# Patient Record
Sex: Male | Born: 2000 | Race: White | Hispanic: No | Marital: Single | Smoking: Never smoker
Health system: Southern US, Community
[De-identification: ages and names within clinical notes are randomized; demographics above are authoritative.]

## PROBLEM LIST (undated history)

## (undated) DIAGNOSIS — E119 Type 2 diabetes mellitus without complications: Secondary | ICD-10-CM

## (undated) DIAGNOSIS — F909 Attention-deficit hyperactivity disorder, unspecified type: Secondary | ICD-10-CM

## (undated) HISTORY — PX: NO PAST SURGERIES: SHX2092

---
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
## 2019-07-03 ENCOUNTER — Encounter: Payer: Self-pay | Admitting: Emergency Medicine

## 2019-07-03 ENCOUNTER — Emergency Department: Payer: Medicaid Other

## 2019-07-03 ENCOUNTER — Observation Stay
Admission: EM | Admit: 2019-07-03 | Discharge: 2019-07-04 | Disposition: A | Discharge status: Home / Self Care | Payer: Medicaid Other | Attending: Family Medicine | Admitting: Family Medicine

## 2019-07-03 ENCOUNTER — Other Ambulatory Visit: Payer: Self-pay

## 2019-07-03 DIAGNOSIS — Z20822 Contact with and (suspected) exposure to covid-19: Secondary | ICD-10-CM | POA: Insufficient documentation

## 2019-07-03 DIAGNOSIS — E109 Type 1 diabetes mellitus without complications: Secondary | ICD-10-CM

## 2019-07-03 DIAGNOSIS — R197 Diarrhea, unspecified: Secondary | ICD-10-CM | POA: Diagnosis not present

## 2019-07-03 DIAGNOSIS — Z794 Long term (current) use of insulin: Secondary | ICD-10-CM | POA: Insufficient documentation

## 2019-07-03 DIAGNOSIS — E861 Hypovolemia: Secondary | ICD-10-CM | POA: Diagnosis not present

## 2019-07-03 DIAGNOSIS — F909 Attention-deficit hyperactivity disorder, unspecified type: Secondary | ICD-10-CM | POA: Diagnosis not present

## 2019-07-03 DIAGNOSIS — R112 Nausea with vomiting, unspecified: Principal | ICD-10-CM | POA: Diagnosis present

## 2019-07-03 DIAGNOSIS — R1013 Epigastric pain: Secondary | ICD-10-CM

## 2019-07-03 DIAGNOSIS — R000 Tachycardia, unspecified: Secondary | ICD-10-CM | POA: Diagnosis not present

## 2019-07-03 DIAGNOSIS — E860 Dehydration: Secondary | ICD-10-CM

## 2019-07-03 LAB — URINALYSIS, COMPLETE (UACMP) WITH MICROSCOPIC
Bacteria, UA: NONE SEEN
Bilirubin Urine: NEGATIVE
Glucose, UA: NEGATIVE mg/dL
Hgb urine dipstick: NEGATIVE
Ketones, ur: 5 mg/dL — AB
Leukocytes,Ua: NEGATIVE
Nitrite: NEGATIVE
Protein, ur: 30 mg/dL — AB
Specific Gravity, Urine: 1.018 (ref 1.005–1.030)
pH: 5 (ref 5.0–8.0)

## 2019-07-03 LAB — COMPREHENSIVE METABOLIC PANEL
ALT: 12 U/L (ref 0–44)
AST: 28 U/L (ref 15–41)
Albumin: 5.3 g/dL — ABNORMAL HIGH (ref 3.5–5.0)
Alkaline Phosphatase: 95 U/L (ref 38–126)
Anion gap: 19 — ABNORMAL HIGH (ref 5–15)
BUN: 15 mg/dL (ref 6–20)
CO2: 19 mmol/L — ABNORMAL LOW (ref 22–32)
Calcium: 10.2 mg/dL (ref 8.9–10.3)
Chloride: 103 mmol/L (ref 98–111)
Creatinine, Ser: 1.05 mg/dL (ref 0.61–1.24)
GFR calc Af Amer: >60 mL/min (ref >60)
GFR calc non Af Amer: >60 mL/min (ref >60)
Glucose, Bld: 85 mg/dL (ref 70–99)
Potassium: 3.6 mmol/L (ref 3.5–5.1)
Sodium: 141 mmol/L (ref 135–145)
Total Bilirubin: 1.3 mg/dL — ABNORMAL HIGH (ref 0.3–1.2)
Total Protein: 9.3 g/dL — ABNORMAL HIGH (ref 6.5–8.1)

## 2019-07-03 LAB — CBC
HCT: 53.3 % — ABNORMAL HIGH (ref 39.0–52.0)
Hemoglobin: 19 g/dL — ABNORMAL HIGH (ref 13.0–17.0)
MCH: 29 pg (ref 26.0–34.0)
MCHC: 35.6 g/dL (ref 30.0–36.0)
MCV: 81.3 fL (ref 80.0–100.0)
Platelets: 390 10*3/uL (ref 150–400)
RBC: 6.56 MIL/uL — ABNORMAL HIGH (ref 4.22–5.81)
RDW: 12.3 % (ref 11.5–15.5)
WBC: 19.5 10*3/uL — ABNORMAL HIGH (ref 4.0–10.5)
nRBC: 0 % (ref 0.0–0.2)

## 2019-07-03 LAB — BASIC METABOLIC PANEL
Anion gap: 4 — ABNORMAL LOW (ref 5–15)
BUN: 13 mg/dL (ref 6–20)
CO2: 27 mmol/L (ref 22–32)
Calcium: 7.5 mg/dL — ABNORMAL LOW (ref 8.9–10.3)
Chloride: 112 mmol/L — ABNORMAL HIGH (ref 98–111)
Creatinine, Ser: 0.82 mg/dL (ref 0.61–1.24)
GFR calc Af Amer: >60 mL/min (ref >60)
GFR calc non Af Amer: >60 mL/min (ref >60)
Glucose, Bld: 128 mg/dL — ABNORMAL HIGH (ref 70–99)
Potassium: 4.2 mmol/L (ref 3.5–5.1)
Sodium: 143 mmol/L (ref 135–145)

## 2019-07-03 LAB — GLUCOSE, CAPILLARY
Glucose-Capillary: 77 mg/dL (ref 70–99)
Glucose-Capillary: 80 mg/dL (ref 70–99)
Glucose-Capillary: 44 mg/dL — CL (ref 70–99)
Glucose-Capillary: 125 mg/dL — ABNORMAL HIGH (ref 70–99)
Glucose-Capillary: 125 mg/dL — ABNORMAL HIGH (ref 70–99)
Glucose-Capillary: 87 mg/dL (ref 70–99)

## 2019-07-03 LAB — RESPIRATORY PANEL BY RT PCR (FLU A&B, COVID)
Influenza A by PCR: NEGATIVE
Influenza B by PCR: NEGATIVE
SARS Coronavirus 2 by RT PCR: NEGATIVE

## 2019-07-03 LAB — LIPASE, BLOOD: Lipase: 15 U/L (ref 11–51)

## 2019-07-03 IMAGING — US US ABDOMEN LIMITED
1 series · 14 of 25 positions shown · non-contrast
Comparison: None.

CLINICAL DATA: Right upper quadrant pain with nausea and vomiting

EXAM:
ULTRASOUND ABDOMEN LIMITED RIGHT UPPER QUADRANT

[Series 1: us abdomen limited · 0.25mm/px · 14 of 39 slices shown]
[im 1/39]
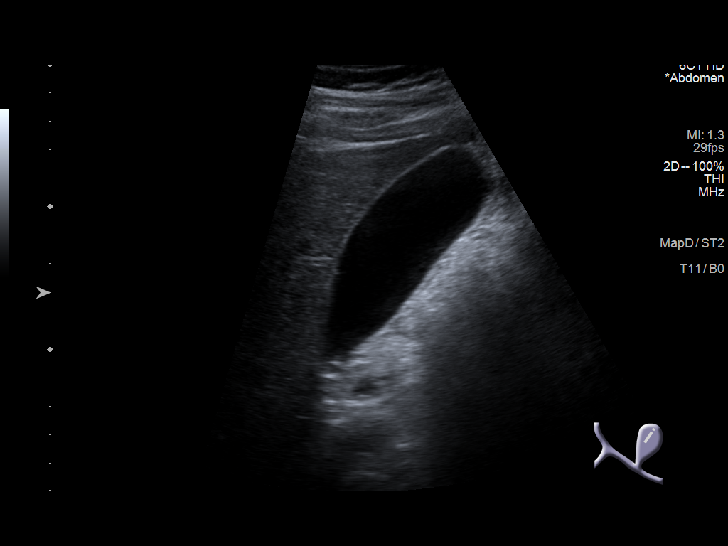
[im 4/39]
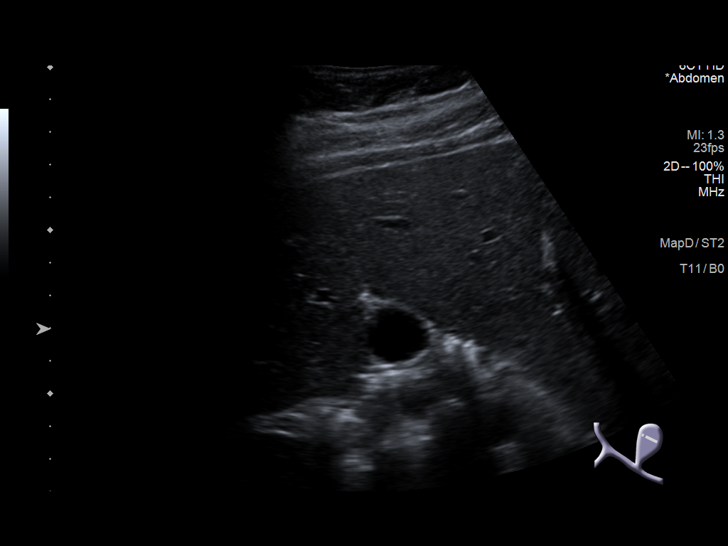
[im 7/39]
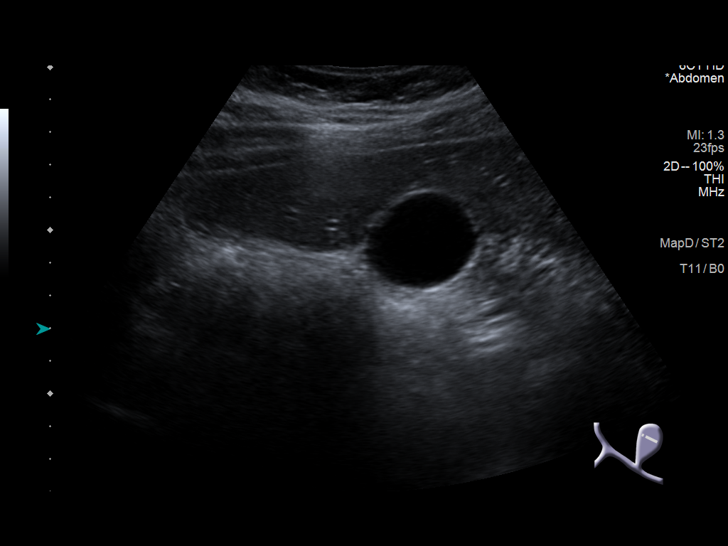
[im 10/39]
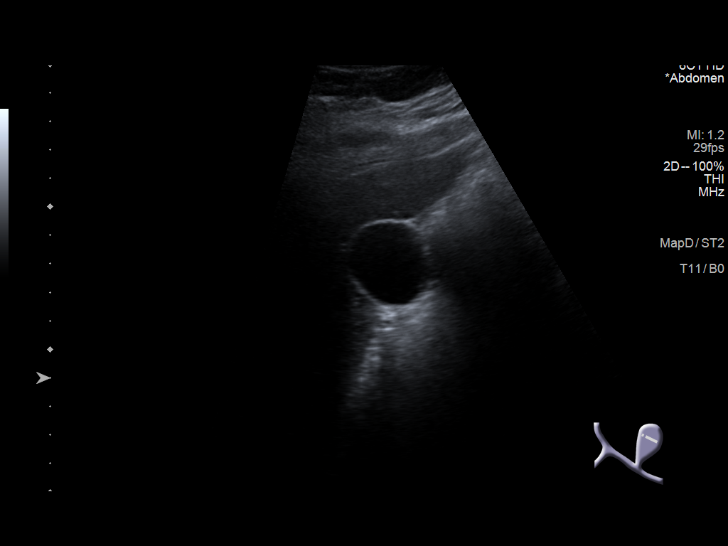
[im 13/39]
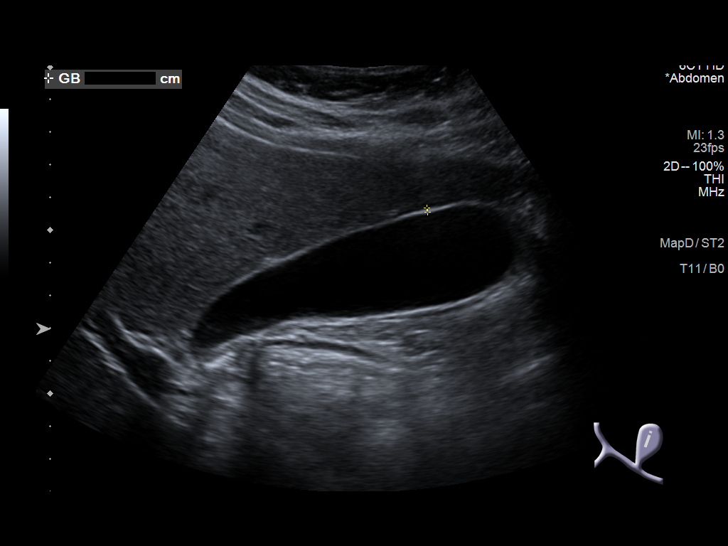
[im 15/39]
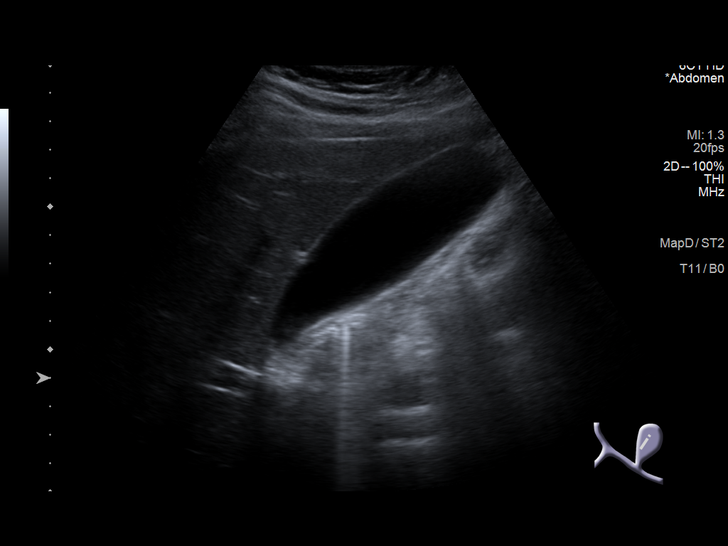
[im 18/39]
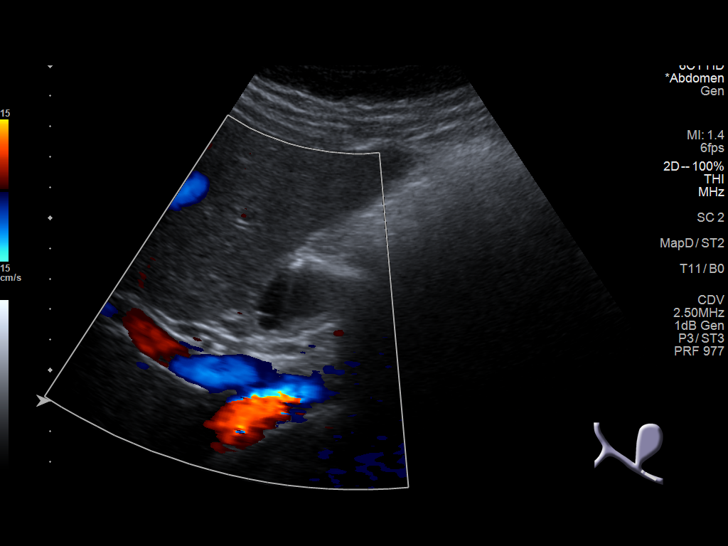
[im 21/39]
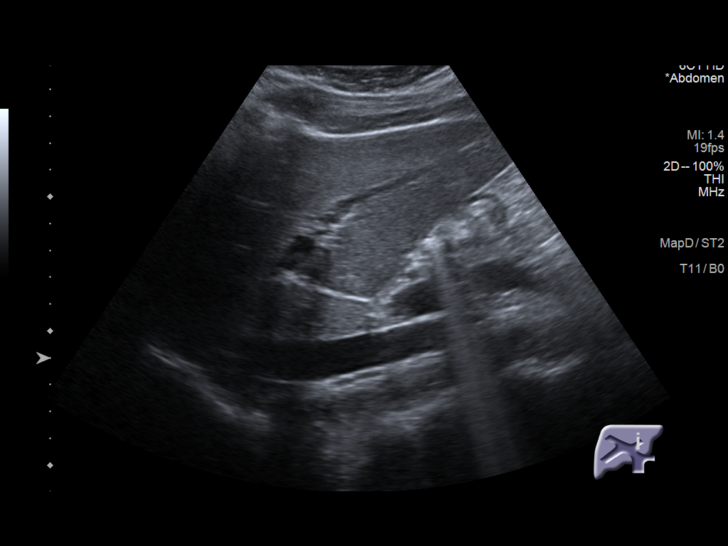
[im 24/39]
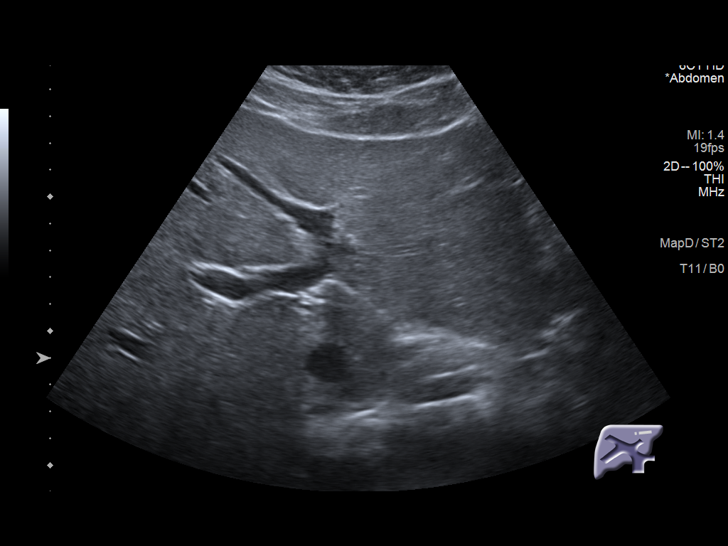
[im 26/39]
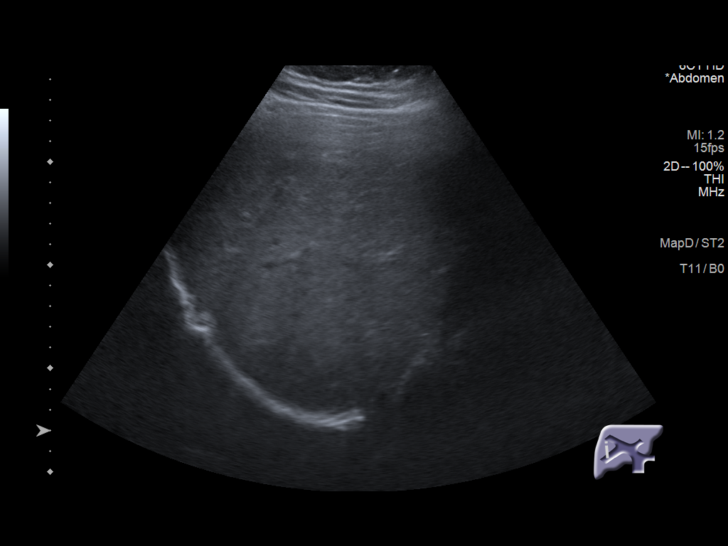
[im 29/39]
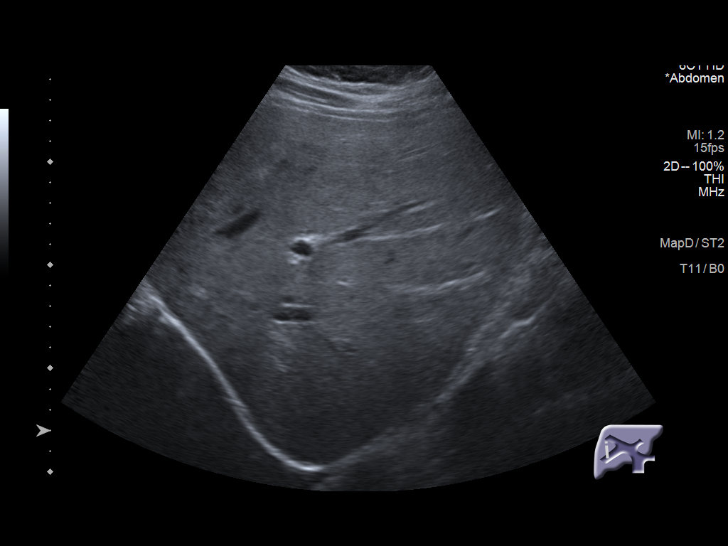
[im 32/39]
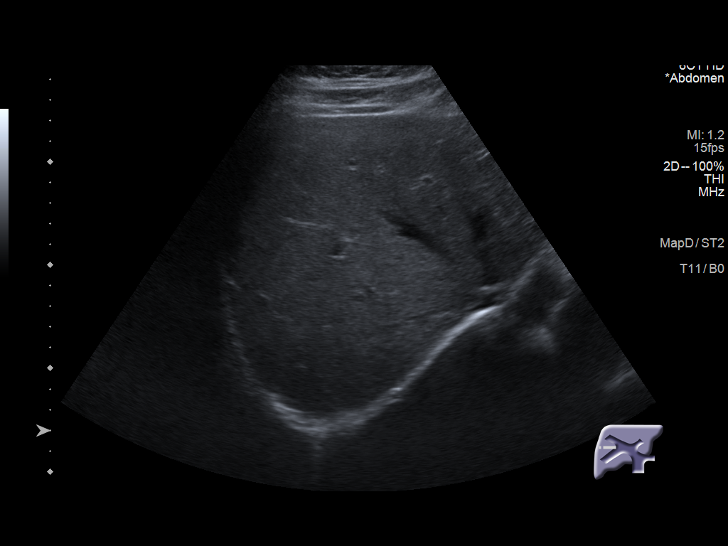
[im 35/39]
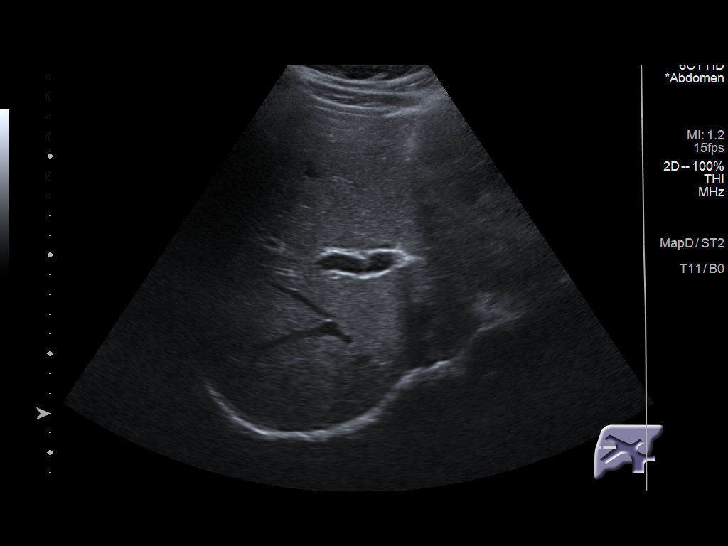
[im 39/39]
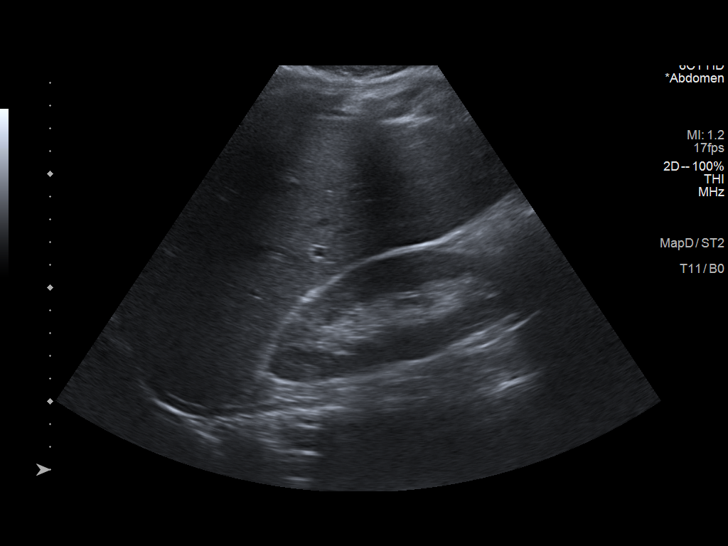

[14 of 25 positions shown; findings below may reference images not displayed]

FINDINGS: Gallbladder:

No gallstones or wall thickening visualized. No sonographic Murphy
sign noted by sonographer.

Common bile duct:

Diameter: 3.2 mm

Liver:

No focal lesion identified. Within normal limits in parenchymal
echogenicity. Portal vein is patent on color Doppler imaging with
normal direction of blood flow towards the liver.

Other: None.
IMPRESSION: Negative right upper quadrant abdominal ultrasound

## 2019-07-03 MED ORDER — ONDANSETRON HCL 4 MG/2ML IJ SOLN
4.0000 mg | Freq: Once | INTRAMUSCULAR | Status: AC
  Administered 2019-07-03: 4 mg via INTRAVENOUS
  Filled 2019-07-03: qty 2

## 2019-07-03 MED ORDER — PANTOPRAZOLE SODIUM 40 MG IV SOLR
40.0000 mg | Freq: Once | INTRAVENOUS | Status: AC
  Administered 2019-07-03: 40 mg via INTRAVENOUS
  Filled 2019-07-03: qty 40

## 2019-07-03 MED ORDER — LORAZEPAM 2 MG/ML IJ SOLN
1.0000 mg | Freq: Once | INTRAMUSCULAR | Status: AC
  Administered 2019-07-03: 1 mg via INTRAVENOUS
  Filled 2019-07-03: qty 1

## 2019-07-03 MED ORDER — ONDANSETRON HCL 4 MG/2ML IJ SOLN
4.0000 mg | Freq: Once | INTRAMUSCULAR | Status: AC
  Administered 2019-07-03: 15:00:00 4 mg via INTRAVENOUS
  Filled 2019-07-03: qty 2

## 2019-07-03 MED ORDER — SODIUM CHLORIDE 0.9 % IV SOLN: Freq: Once | INTRAVENOUS | Status: AC

## 2019-07-03 MED ORDER — PROMETHAZINE HCL 25 MG/ML IJ SOLN
12.5000 mg | Freq: Once | INTRAMUSCULAR | Status: AC
  Administered 2019-07-03: 12.5 mg via INTRAVENOUS

## 2019-07-03 MED ORDER — METOCLOPRAMIDE HCL 5 MG/ML IJ SOLN
10.0000 mg | Freq: Once | INTRAMUSCULAR | Status: AC
  Administered 2019-07-03: 10 mg via INTRAVENOUS
  Filled 2019-07-03: qty 2

## 2019-07-03 NOTE — ED Triage Notes (Addendum)
Pt in via [facility], reports sudden onset N/V.  Diaphoretic and dry heaving in triage.  Tachycardic, other vitals WDL.

## 2019-07-03 NOTE — ED Notes (Signed)
Admitting provider at bedside.

## 2019-07-03 NOTE — ED Notes (Signed)
Pt calling out. This RN at bedside. Pt stating he is starting to feel nauseas again. MD made aware.

## 2019-07-03 NOTE — ED Provider Notes (Addendum)
3:42 PM Assumed care for off going team.   Blood pressure 134/89, pulse (!) 103, resp. rate (!) 26, height 5\' 6"  (1.676 m), weight 78 kg, SpO2 98 %.  See their HPI for full report but in brief patient was handed off to me pending reevaluation.  Patient sugar started to go low with the fluids.  Patient is tolerating p.o. feeling much better now.  Patient drank 2 cups of juice and crackers.  Plan to watch patient for little while to make sure that his sugars are stabilizing.  Will get a repeat BMP while waiting.  5:38 PM having worsening symptoms again.  Patient's was given Zofran without any effect.  Patient stated the Ativan seemed to help more.  Will give a dose in EKG in case patient needs other QTC is prolonged or antinausea medicine.   5:54 PM EKG is sinus tachycardia rate of 115, no ST elevation, T wave inversion in lead III, QTC of 486  7:11 PM patient still feeling very nauseous.  Discussed with patient admission versus trial another IV dose.  Patient states he would like to go home if possible.  Will trial some IV Reglan but this is not improving symptoms patient has been here over 6 hours without relief and may need to be admitted given his continued tachycardia, type 1 diabetes and continued nausea and vomiting.  8:18 PM patient still having nausea and heart rates have now gone up to 130s.  Discussed with patient given the episodes of hypoglycemia and patient symptoms likely would be best to admit patient for monitoring overnight.  Patient is okay with this plan  Discussed with the hospital team and will admit patient  Patient has continued to have vomiting.  Reported a little bit of epigastric tenderness.  Ultrasound ordered.  Patient now having more vomiting.  Hospitalist team has not signed up for the patient yet although they have admitted patient.  Repeat EKG is sinus tachycardia rate of 121 no ST elevation, no T wave inversions, normal QTC.  Will give a dose of IV  Phenergan.        , MD 07/03/19 2019    07/05/19, MD 07/03/19 2223

## 2019-07-03 NOTE — ED Notes (Signed)
This RN at bedside. Pt actively vomiting. MD made aware. No new orders at this time.  

## 2019-07-03 NOTE — ED Notes (Signed)
This RN at bedside. Pt resting comfortably with eyes closed. Pt still remains tachycardic. MD made aware.

## 2019-07-03 NOTE — ED Notes (Signed)
Patient much more comfortable at present

## 2019-07-03 NOTE — ED Notes (Signed)
Pt CBG 44. Per MD pt given orange juice and graham crackers. Pt advised is unable to keep food down to notify this RN.

## 2019-07-03 NOTE — ED Triage Notes (Signed)
Pt comes into the ED via EMS from home with c/o sudden onset N/V today , insulin dependent, 91CBG. #20Lhand,. 4mg  zofran given, no vomiting since zofran.states he took his regular insulin this morning

## 2019-07-03 NOTE — ED Provider Notes (Signed)
[facility] Emergency Department Provider Note       Time seen: ----------------------------------------- 2:04 PM on 07/03/2019 -----------------------------------------   I have reviewed the triage vital signs and the nursing notes.  HISTORY   Chief Complaint Emesis   HPI Thomas Maddox. is a 19 y.o. male with a history of ADHD, diabetes, acute kidney injury who presents to the ED for sudden onset of nausea and vomiting.  He was noted to be diaphoretic and dry heaving with tachycardia.  Patient is insulin-dependent, reportedly blood sugar has been under control.  Past Medical History:  Diagnosis Date  . ADHD (attention deficit hyperactivity disorder)   . Diabetes mellitus without complication [facility])     Patient Active Problem List   Diagnosis Date Noted  . AKI (acute kidney injury) (HCC) 02/09/2016  . DKA, type 1 (HCC) 03/27/2015  . DKA (diabetic ketoacidoses) (HCC) 03/17/2015    Past Surgical History:  Procedure Laterality Date  . NO PAST SURGERIES      Allergies Patient has no known allergies.  Social History Social History   Tobacco Use  . Smoking status: Never Smoker  . Smokeless tobacco: Never Used  Substance Use Topics  . Alcohol use: No  . Drug use: No    Review of Systems Constitutional: Negative for fever. Cardiovascular: Negative for chest pain. Respiratory: Negative for shortness of breath. Gastrointestinal: Negative for abdominal pain, positive for nausea vomiting Musculoskeletal: Negative for back pain. Skin: Negative for rash. Neurological: Negative for headaches, focal weakness or numbness.  All systems negative/normal/unremarkable except as stated in the HPI  ____________________________________________   PHYSICAL EXAM:  VITAL SIGNS: ED Triage Vitals [07/03/19 1328]  Enc Vitals Group     BP (!) 125/99     Pulse Rate (!) 127     Resp (!) 26     Temp      Temp Source Oral     SpO2 97 %     Weight  172 lb (78 kg)     Height 5\' 6"  (1.676 m)     Head Circumference      Peak Flow      Pain Score 0     Pain Loc      Pain Edu?      Excl. in GC?     Constitutional: Alert and oriented.  Mild to moderate distress Eyes: Conjunctivae are normal. Normal extraocular movements. ENT      Head: Normocephalic and atraumatic.      Nose: No congestion/rhinnorhea.      Mouth/Throat: Mucous membranes are moist.      Neck: No stridor. Cardiovascular: Rapid rate, regular rhythm. No murmurs, rubs, or gallops. Respiratory: Normal respiratory effort without tachypnea nor retractions. Breath sounds are clear and equal bilaterally. No wheezes/rales/rhonchi. Gastrointestinal: Soft and nontender. Normal bowel sounds Musculoskeletal: Nontender with normal range of motion in extremities. No lower extremity tenderness nor edema. Neurologic:  Normal speech and language. No gross focal neurologic deficits are appreciated.  Skin:  Skin is warm, dry and intact. No rash noted. Psychiatric: Anxious, mild distress ____________________________________________  ED COURSE:  As part of my medical decision making, I reviewed the following data within the electronic MEDICAL RECORD NUMBER History obtained from family if available, nursing notes, old chart and ekg, as well as notes from prior ED visits. Patient presented for nausea and vomiting in the setting of type 1 diabetes, we will assess with labs and imaging as indicated at this time.   Procedures  [name] was evaluated in Emergency Department on 07/03/2019 for the symptoms described in the history of present illness. He was evaluated in the context of the global COVID-19 pandemic, which necessitated consideration that the patient might be at risk for infection with the SARS-CoV-2 virus that causes COVID-19. Institutional protocols and algorithms that pertain to the evaluation of patients at risk for COVID-19 are in a state of rapid change based on information  released by regulatory bodies including the CDC and federal and state organizations. These policies and algorithms were followed during the patient's care in the ED.  ____________________________________________   LABS (pertinent positives/negatives)  Labs Reviewed  COMPREHENSIVE METABOLIC PANEL - Abnormal; Notable for the following components:      Result Value   CO2 19 (*)    Total Protein 9.3 (*)    Albumin 5.3 (*)    Total Bilirubin 1.3 (*)    Anion gap 19 (*)    All other components within normal limits  CBC - Abnormal; Notable for the following components:   WBC 19.5 (*)    RBC 6.56 (*)    Hemoglobin 19.0 (*)    HCT 53.3 (*)    All other components within normal limits  LIPASE, BLOOD  GLUCOSE, CAPILLARY  GLUCOSE, CAPILLARY  URINALYSIS, COMPLETE (UACMP) WITH MICROSCOPIC  BLOOD GAS, VENOUS  ____________________________________________   DIFFERENTIAL DIAGNOSIS   Gastritis, gastroenteritis, dehydration, electrolyte abnormality, gastroparesis, DKA, HH NK  FINAL ASSESSMENT AND PLAN  Vomiting, dehydration   Plan: The patient had presented for acute vomiting. Patient's labs did indicate some dehydration with an elevated white count, & elevated hemoglobin.  He was given fluids as well as IV Zofran and Ativan.  Final disposition is pending at this time.   Laurence Aly, MD    Note: This note was generated in part or whole with voice recognition software. Voice recognition is usually quite accurate but there are transcription errors that can and very often do occur. I apologize for any typographical errors that were not detected and corrected.     Earleen Newport, MD 07/03/19 1440

## 2019-07-03 NOTE — ED Notes (Signed)
Patient provided with gingerale.

## 2019-07-03 NOTE — ED Notes (Signed)
Pt calling out stating he is nauseas again. MD at bedside.

## 2019-07-04 ENCOUNTER — Encounter: Payer: Self-pay | Admitting: Family Medicine

## 2019-07-04 DIAGNOSIS — E109 Type 1 diabetes mellitus without complications: Secondary | ICD-10-CM

## 2019-07-04 DIAGNOSIS — R112 Nausea with vomiting, unspecified: Secondary | ICD-10-CM

## 2019-07-04 DIAGNOSIS — R000 Tachycardia, unspecified: Secondary | ICD-10-CM

## 2019-07-04 DIAGNOSIS — F909 Attention-deficit hyperactivity disorder, unspecified type: Secondary | ICD-10-CM | POA: Diagnosis not present

## 2019-07-04 HISTORY — DX: Tachycardia, unspecified: R00.0

## 2019-07-04 LAB — GLUCOSE, CAPILLARY
Glucose-Capillary: 171 mg/dL — ABNORMAL HIGH (ref 70–99)
Glucose-Capillary: 202 mg/dL — ABNORMAL HIGH (ref 70–99)
Glucose-Capillary: 222 mg/dL — ABNORMAL HIGH (ref 70–99)
Glucose-Capillary: 222 mg/dL — ABNORMAL HIGH (ref 70–99)
Glucose-Capillary: 233 mg/dL — ABNORMAL HIGH (ref 70–99)
Glucose-Capillary: 239 mg/dL — ABNORMAL HIGH (ref 70–99)
Glucose-Capillary: 254 mg/dL — ABNORMAL HIGH (ref 70–99)
Glucose-Capillary: 285 mg/dL — ABNORMAL HIGH (ref 70–99)
Glucose-Capillary: 301 mg/dL — ABNORMAL HIGH (ref 70–99)

## 2019-07-04 LAB — URINE DRUG SCREEN, QUALITATIVE (ARMC ONLY)
Amphetamines, Ur Screen: POSITIVE — AB
Barbiturates, Ur Screen: NOT DETECTED
Benzodiazepine, Ur Scrn: POSITIVE — AB
Cannabinoid 50 Ng, Ur ~~LOC~~: NOT DETECTED
Cocaine Metabolite,Ur ~~LOC~~: NOT DETECTED
MDMA (Ecstasy)Ur Screen: NOT DETECTED
Methadone Scn, Ur: NOT DETECTED
Opiate, Ur Screen: NOT DETECTED
Phencyclidine (PCP) Ur S: NOT DETECTED
Tricyclic, Ur Screen: NOT DETECTED

## 2019-07-04 LAB — BASIC METABOLIC PANEL
Anion gap: 10 (ref 5–15)
BUN: 13 mg/dL (ref 6–20)
CO2: 23 mmol/L (ref 22–32)
Calcium: 7.6 mg/dL — ABNORMAL LOW (ref 8.9–10.3)
Chloride: 105 mmol/L (ref 98–111)
Creatinine, Ser: 0.69 mg/dL (ref 0.61–1.24)
GFR calc Af Amer: >60 mL/min (ref >60)
GFR calc non Af Amer: >60 mL/min (ref >60)
Glucose, Bld: 309 mg/dL — ABNORMAL HIGH (ref 70–99)
Potassium: 3.6 mmol/L (ref 3.5–5.1)
Sodium: 138 mmol/L (ref 135–145)

## 2019-07-04 LAB — CBC
HCT: 41.4 % (ref 39.0–52.0)
Hemoglobin: 13.8 g/dL (ref 13.0–17.0)
MCH: 28.2 pg (ref 26.0–34.0)
MCHC: 33.3 g/dL (ref 30.0–36.0)
MCV: 84.7 fL (ref 80.0–100.0)
Platelets: 221 10*3/uL (ref 150–400)
RBC: 4.89 MIL/uL (ref 4.22–5.81)
RDW: 12.4 % (ref 11.5–15.5)
WBC: 10.8 10*3/uL — ABNORMAL HIGH (ref 4.0–10.5)
nRBC: 0 % (ref 0.0–0.2)

## 2019-07-04 LAB — HIV ANTIBODY (ROUTINE TESTING W REFLEX): HIV Screen 4th Generation wRfx: NONREACTIVE

## 2019-07-04 LAB — HEMOGLOBIN A1C
Hgb A1c MFr Bld: 11.8 % — ABNORMAL HIGH (ref 4.8–5.6)
Mean Plasma Glucose: 291.96 mg/dL

## 2019-07-04 MED ORDER — INSULIN ASPART 100 UNIT/ML FLEXPEN: 8.0000 [IU] | PEN_INJECTOR | Freq: Three times a day (TID) | SUBCUTANEOUS | 1 refills | Status: DC

## 2019-07-04 MED ORDER — BLOOD GLUCOSE MONITOR KIT: PACK | 0 refills | Status: AC

## 2019-07-04 MED ORDER — SODIUM CHLORIDE 0.9 % IV SOLN: INTRAVENOUS | Status: DC

## 2019-07-04 MED ORDER — INSULIN ASPART 100 UNIT/ML ~~LOC~~ SOLN
8.0000 [IU] | Freq: Three times a day (TID) | SUBCUTANEOUS | Status: DC
  Administered 2019-07-04 (×2): 8 [IU] via SUBCUTANEOUS
  Filled 2019-07-04 (×2): qty 1

## 2019-07-04 MED ORDER — INFLUENZA VAC SPLIT QUAD 0.5 ML IM SUSY: 0.5000 mL | PREFILLED_SYRINGE | INTRAMUSCULAR | Status: DC

## 2019-07-04 MED ORDER — INSULIN ASPART 100 UNIT/ML ~~LOC~~ SOLN
0.0000 [IU] | SUBCUTANEOUS | Status: DC
  Administered 2019-07-04: 3 [IU] via SUBCUTANEOUS
  Administered 2019-07-04: 7 [IU] via SUBCUTANEOUS
  Administered 2019-07-04: 16:00:00 3 [IU] via SUBCUTANEOUS
  Filled 2019-07-04 (×2): qty 1

## 2019-07-04 MED ORDER — INSULIN GLARGINE 100 UNIT/ML ~~LOC~~ SOLN
25.0000 [IU] | Freq: Every day | SUBCUTANEOUS | Status: DC
  Filled 2019-07-04: qty 0.25

## 2019-07-04 MED ORDER — LACTATED RINGERS IV BOLUS
1000.0000 mL | Freq: Once | INTRAVENOUS | Status: AC
  Administered 2019-07-04: 1000 mL via INTRAVENOUS

## 2019-07-04 MED ORDER — INSULIN DEGLUDEC 100 UNIT/ML ~~LOC~~ SOPN: 25.0000 [IU] | PEN_INJECTOR | Freq: Every day | SUBCUTANEOUS | 1 refills | Status: AC

## 2019-07-04 MED ORDER — INSULIN PEN NEEDLE 32G X 4 MM MISC: 1 refills | Status: AC

## 2019-07-04 MED ORDER — INSULIN STARTER KIT- PEN NEEDLES (ENGLISH)
1.0000 | Freq: Once | Status: AC
  Administered 2019-07-04: 1
  Filled 2019-07-04 (×2): qty 1

## 2019-07-04 MED ORDER — INSULIN GLARGINE 100 UNIT/ML ~~LOC~~ SOLN
15.0000 [IU] | Freq: Once | SUBCUTANEOUS | Status: AC
  Administered 2019-07-04: 15 [IU] via SUBCUTANEOUS
  Filled 2019-07-04 (×2): qty 0.15

## 2019-07-04 MED ORDER — INSULIN ASPART 100 UNIT/ML ~~LOC~~ SOLN
0.0000 [IU] | Freq: Three times a day (TID) | SUBCUTANEOUS | Status: DC
  Filled 2019-07-04: qty 1

## 2019-07-04 MED ORDER — INSULIN DETEMIR 100 UNIT/ML ~~LOC~~ SOLN
10.0000 [IU] | Freq: Two times a day (BID) | SUBCUTANEOUS | Status: DC
  Administered 2019-07-04: 10:00:00 10 [IU] via SUBCUTANEOUS
  Filled 2019-07-04 (×2): qty 0.1

## 2019-07-04 MED ORDER — PROMETHAZINE HCL 25 MG/ML IJ SOLN: 12.5000 mg | Freq: Four times a day (QID) | INTRAMUSCULAR | Status: DC | PRN

## 2019-07-04 MED ORDER — INSULIN ASPART 100 UNIT/ML ~~LOC~~ SOLN: 12.0000 [IU] | Freq: Three times a day (TID) | SUBCUTANEOUS | Status: DC

## 2019-07-04 MED ORDER — ACETAMINOPHEN 325 MG PO TABS
650.0000 mg | ORAL_TABLET | Freq: Four times a day (QID) | ORAL | Status: DC | PRN
  Administered 2019-07-04: 650 mg via ORAL
  Filled 2019-07-04: qty 2

## 2019-07-04 NOTE — Progress Notes (Signed)
Thomas Maddox.  A and O x 4. VSS. Pt tolerating diet well. No complaints of pain or nausea. IV removed intact, prescriptions given. Pt voiced understanding of discharge instructions with no further questions. Pt discharged home.  Allergies as of 07/04/2019   No Known Allergies     Medication List    STOP taking these medications   insulin aspart protamine- aspart (70-30) 100 UNIT/ML injection Commonly known as: NOVOLOG MIX 70/30     TAKE these medications   amphetamine-dextroamphetamine 10 MG tablet Commonly known as: ADDERALL Take 10 mg by mouth daily at 2 PM.   amphetamine-dextroamphetamine 10 MG 24 hr capsule Commonly known as: ADDERALL XR Take 10 mg by mouth every morning.   blood glucose meter kit and supplies Kit Dispense based on patient and insurance preference. Use up to four times daily as directed. (FOR ICD-9 250.00, 250.01).   insulin aspart 100 UNIT/ML FlexPen Commonly known as: NOVOLOG Inject 8 Units into the skin 3 (three) times daily with meals.   insulin degludec 100 UNIT/ML FlexTouch Pen Commonly known as: TRESIBA Inject 0.25 mLs (25 Units total) into the skin daily. What changed: when to take this   Insulin Pen Needle 32G X 4 MM Misc Use with insulin       Vitals:   07/04/19 1200 07/04/19 1656  BP: 114/75 115/82  Pulse: (!) 107 (!) 103  Resp: 20 16  Temp: 98 F (36.7 C) 98 F (36.7 C)  SpO2: 99% 100%    Thomas Maddox

## 2019-07-04 NOTE — Plan of Care (Signed)
Nutrition Education Note   RD consulted for nutrition education regarding diabetes.   19 y.o. male with medical history significant for Type 1 diabetes and  ADHD who presents with persistent nausea and vomiting.   Attempted to provide patient with DM education today. Pt declines education at this time. Pt reports that he is a pretty good eater at home and that he knows what foods to eat and not eat. RD did provide "Carbohydrate Counting for People with Diabetes" handout from the Academy of Nutrition and Dietetics.   Lab Results  Component Value Date   HGBA1C 11.8 (H) 07/04/2019   Expect poor compliance.  Body mass index is 27.76 kg/m. Pt meets criteria for overweight based on current BMI.  Current diet order is CHO modified, patient is consuming approximately 100% of meals at this time. Labs and medications reviewed. No further nutrition interventions warranted at this time. RD contact information provided. If additional nutrition issues arise, please re-consult RD.  Betsey Holiday MS, RD, LDN Contact information available in Amion

## 2019-07-04 NOTE — ED Notes (Signed)
Patient ambulating to toilet, no assistance needed

## 2019-07-04 NOTE — H&P (Signed)
History and Physical    Thomas Maddox. EGB:[id number] DOB: January 18, 2001 DOA: 07/03/2019  [facility]P: [facility]  Patient coming from: Home  I have personally briefly reviewed patient's old medical records in [facility]  Chief Complaint: nausea and vomiting   HPI: Thomas Maddox. is a 19 y.o. male with medical history significant for Type 1 diabetes and  ADHD who presents with persistent nausea and vomiting.   He woke up this morning feeling nausea and then around 11AM he started to have persistent vomiting. Vomitus was non-bloody but bilious. Only has abdomen pain during vomiting episodes. He also had diarrhea in the ED. Denies any fever but felt chills. No respiratory symptoms including cough, runny nose or shortness of breath. No sick contact at home but he works with [employer]. He took 23 units of 70/30 this morning but has not had much to eat since.  Also took his Adderall.  Denies tobacco, alcohol or illicit drug use.  ED Course: He was afebrile, tachycardic up to 120s and normotensive on room air.  He had leukocytosis of 19.5K, hemoglobin of 19 which is hemoconcentrated.  Glucose of 85, anion gap of 19.  Normal LFTs.  Mildly elevated total bilirubin 1.3.  Negative UA.  Negative right upper quadrant ultrasound  He received 2L of IV NS bolus, 2mg  of Ativan, 10mg  of Reglan, Zofran x2, PPI, and phenergan in the ED.   Review of Systems: Constitutional: No Weight Change, No Fever ENT/Mouth: No sore throat, No Rhinorrhea Eyes: No Eye Pain, No Vision Changes Cardiovascular: No Chest Pain, no SOB Respiratory: No Cough, No Sputum,  Gastrointestinal: +Nausea, + Vomiting, + Diarrhea, No Constipation, No Pain Genitourinary: no Urinary Incontinence,  Musculoskeletal: No Arthralgias, No Myalgias Skin: No Skin Lesions, No Pruritus, Neuro: no Weakness, No Numbness,   Psych: No Anxiety/Panic, No Depression, + decrease appetite Heme/Lymph: No Bruising, No  Bleeding Past Medical History:  Diagnosis Date  . ADHD (attention deficit hyperactivity disorder)   . Diabetes mellitus without complication [facility])     Past Surgical History:  Procedure Laterality Date  . NO PAST SURGERIES       reports that he has never smoked. He has never used smokeless tobacco. He reports that he does not drink alcohol or use drugs.  No Known Allergies  Family History  Problem Relation Age of Onset  . Hypertension Other      Prior to Admission medications   Medication Sig Start Date End Date Taking? Authorizing Provider  amphetamine-dextroamphetamine (ADDERALL XR) 10 MG 24 hr capsule Take 10 mg by mouth every morning.    Yes [provider]  amphetamine-dextroamphetamine (ADDERALL) 10 MG tablet Take 10 mg by mouth daily at 2 PM.    Yes [provider]  insulin aspart protamine- aspart (NOVOLOG MIX 70/30) (70-30) 100 UNIT/ML injection Inject 21-23 Units into the skin daily with breakfast. Inject 23 units every morning and 21 units every evening   Yes [provider]  insulin degludec (TRESIBA) 100 UNIT/ML SOPN FlexTouch Pen Inject 25 Units into the skin at bedtime.  02/03/16   [provider]    Physical Exam: Vitals:   07/03/19 2300 07/03/19 2315 07/03/19 2330 07/04/19 0000  BP: 108/65  118/76 126/77  Pulse:  (!) 109 (!) 126 (!) 119  Resp:  19 19 (!) 24  Temp:      TempSrc:      SpO2:  100% 95% 99%  Weight:  Height:        Constitutional: NAD, calm, comfortable, young male sitting upright in bed and holding cellphone Vitals:   07/03/19 2300 07/03/19 2315 07/03/19 2330 07/04/19 0000  BP: 108/65  118/76 126/77  Pulse:  (!) 109 (!) 126 (!) 119  Resp:  19 19 (!) 24  Temp:      TempSrc:      SpO2:  100% 95% 99%  Weight:      Height:       Eyes: PERRL, lids and conjunctivae normal ENMT: Mucous membranes are moist.Has lip jewelry ring. Neck: normal, supple Respiratory: clear to auscultation bilaterally, no  wheezing, no crackles. Normal respiratory effort. Cardiovascular: sinus tachycardia up to 120 on telemetry, no murmurs / rubs / gallops. No extremity edema. 2+ pedal pulses.  Abdomen: soft, no tenderness, no masses palpated. No guarding. No rigidity. Bowel sounds positive. Has jewelry on umbilicus. Musculoskeletal: no clubbing / cyanosis. No joint deformity upper and lower extremities. Good ROM, no contractures. Normal muscle tone.  Skin: no rashes, lesions, ulcers. No induration Neurologic: CN 2-12 grossly intact. Sensation intact. Strength 5/5 in all 4.  Psychiatric: Normal judgment and insight. Alert and oriented x 3. Normal mood.     Labs on Admission: I have personally reviewed following labs and imaging studies  CBC: Recent Labs  Lab 07/03/19 1339  WBC 19.5*  HGB 19.0*  HCT 53.3*  MCV 81.3  PLT 390   Basic Metabolic Panel: Recent Labs  Lab 07/03/19 1339 07/03/19 1543  NA 141 143  K 3.6 4.2  CL 103 112*  CO2 19* 27  GLUCOSE 85 128*  BUN 15 13  CREATININE 1.05 0.82  CALCIUM 10.2 7.5*   GFR: Estimated Creatinine Clearance: 141.3 mL/min (by C-G formula based on SCr of 0.82 mg/dL). Liver Function Tests: Recent Labs  Lab 07/03/19 1339  AST 28  ALT 12  ALKPHOS 95  BILITOT 1.3*  PROT 9.3*  ALBUMIN 5.3*   Recent Labs  Lab 07/03/19 1339  LIPASE 15   No results for input(s): AMMONIA in the last 168 hours. Coagulation Profile: No results for input(s): INR, PROTIME in the last 168 hours. Cardiac Enzymes: No results for input(s): CKTOTAL, CKMB, CKMBINDEX, TROPONINI in the last 168 hours. BNP (last 3 results) No results for input(s): PROBNP in the last 8760 hours. HbA1C: No results for input(s): HGBA1C in the last 72 hours. CBG: Recent Labs  Lab 07/03/19 1509 07/03/19 1630 07/03/19 1756 07/03/19 2142 07/04/19 0010  GLUCAP 44* 125* 125* 87 202*   Lipid Profile: No results for input(s): CHOL, HDL, LDLCALC, TRIG, CHOLHDL, LDLDIRECT in the last 72  hours. Thyroid Function Tests: No results for input(s): TSH, T4TOTAL, FREET4, T3FREE, THYROIDAB in the last 72 hours. Anemia Panel: No results for input(s): VITAMINB12, FOLATE, FERRITIN, TIBC, IRON, RETICCTPCT in the last 72 hours. Urine analysis:    Component Value Date/Time   COLORURINE YELLOW (A) 07/03/2019 1524   APPEARANCEUR HAZY (A) 07/03/2019 1524   APPEARANCEUR Clear 05/06/2013 1847   LABSPEC 1.018 07/03/2019 1524   LABSPEC 1.024 05/06/2013 1847   PHURINE 5.0 07/03/2019 1524   GLUCOSEU NEGATIVE 07/03/2019 1524   GLUCOSEU >=500 05/06/2013 1847   HGBUR NEGATIVE 07/03/2019 1524   BILIRUBINUR NEGATIVE 07/03/2019 1524   BILIRUBINUR Negative 05/06/2013 1847   KETONESUR 5 (A) 07/03/2019 1524   PROTEINUR 30 (A) 07/03/2019 1524   NITRITE NEGATIVE 07/03/2019 1524   LEUKOCYTESUR NEGATIVE 07/03/2019 1524   LEUKOCYTESUR Negative 05/06/2013 1847    Radiological Exams  on Admission: US ABDOMEN LIMITED RUQ  Result Date: 07/03/2019 CLINICAL DATA:  Right upper quadrant pain with nausea and vomiting EXAM: ULTRASOUND ABDOMEN LIMITED RIGHT UPPER QUADRANT COMPARISON:  None. FINDINGS: Gallbladder: No gallstones or wall thickening visualized. No sonographic Murphy sign noted by sonographer. Common bile duct: Diameter: 3.2 mm Liver: No focal lesion identified. Within normal limits in parenchymal echogenicity. Portal vein is patent on color Doppler imaging with normal direction of blood flow towards the liver. Other: None. IMPRESSION: Negative right upper quadrant abdominal ultrasound Electronically Signed   By: Donavan Foil M.D.   On: 07/03/2019 22:26    EKG: Independently reviewed.   Assessment/Plan  Tachycardia likely secondary to hypovolemia and Adderall which he takes for ADHD Admit with telemetry He has received 2 L of IV fluid in the ED.  We will continue IV 100 cc/h fluid Will also check a UDS  Nausea /vomiting likely secondary to gastroenteritis Negative right upper quadrant  ultrasound Negative Covid test Continue fluids As needed antiemetic   Type 1 diabetes Reports that he takes 70/30 insulin: 23 units in the morning, 20 in the afternoon and 25 at night monitor q2hr for now because BG around 80 and he had 23 units without much PO intake  keep on moderate SSI for now  ADHD hold Adderall  DVT prophylaxis: SCDs  Code Status: Full Family Communication: Plan discussed with patient at bedside  disposition Plan: Home with observation Consults called:  Admission status: Observation   T  DO [facility]   If 7PM-7AM, please contact night-coverage [email or web address]   07/04/2019, 12:16 AM

## 2019-07-04 NOTE — ED Notes (Signed)
ED TO INPATIENT HANDOFF REPORT  ED Nurse Name and Phone #: Anda Kraft [phone]  N Name/Age/Gender Thomas Maddox 19 y.o. male Room/Bed: ED03A/ED03A  Code Status   Code Status: Full Code  Home/SNF/Other Home Patient oriented to: self, place, time and situation Is this baseline? Yes   Triage Complete: Triage complete  Chief Complaint Intractable vomiting with nausea [R11.2]  Triage Note Pt comes into the ED via EMS from home with c/o sudden onset N/V today , insulin dependent, 91CBG. #20Lhand,. 4mg  zofran given, no vomiting since zofran.states he took his regular insulin this morning   Pt in via [facility], reports sudden onset N/V.  Diaphoretic and dry heaving in triage.  Tachycardic, other vitals WDL.      Allergies No Known Allergies  Level of Care/Admitting Diagnosis ED Disposition    ED Disposition Condition [facility]: [place] [100120]  Level of Care: Med-Surg [16]  Covid Evaluation: Asymptomatic Screening Protocol (No Symptoms)  Diagnosis: Intractable vomiting with nausea [5621308]  Admitting Physician: Orene Desanctis [[id number]]  Attending Physician: Orene Desanctis [[id number]]       B Medical/Surgery History Past Medical History:  Diagnosis Date  . ADHD (attention deficit hyperactivity disorder)   . Diabetes mellitus without complication [facility])    Past Surgical History:  Procedure Laterality Date  . NO PAST SURGERIES       A IV Location/Drains/Wounds Patient Lines/Drains/Airways Status   Active Line/Drains/Airways    Name:   Placement date:   Placement time:   Site:   Days:   Peripheral IV 07/03/19 Right Antecubital   07/03/19    1400    Antecubital   1          Intake/Output Last 24 hours  Intake/Output Summary (Last 24 hours) at 07/04/2019 0145 Last data filed at 07/03/2019 1635 Gross per 24 hour  Intake 2000 ml  Output --  Net 2000 ml    Labs/Imaging Results for orders placed or performed during the  hospital encounter of 07/03/19 (from the past 48 hour(s))  Glucose, capillary     Status: None   Collection Time: 07/03/19  1:34 PM  Result Value Ref Range   Glucose-Capillary 80 70 - 99 mg/dL    Comment: Glucose reference range applies only to samples taken after fasting for at least 8 hours.  Lipase, blood     Status: None   Collection Time: 07/03/19  1:39 PM  Result Value Ref Range   Lipase 15 11 - 51 U/L    Comment: Performed at [facility], [address] [place], [place] [address]  Comprehensive metabolic panel     Status: Abnormal   Collection Time: 07/03/19  1:39 PM  Result Value Ref Range   Sodium 141 135 - 145 mmol/L   Potassium 3.6 3.5 - 5.1 mmol/L    Comment: HEMOLYSIS AT THIS LEVEL MAY AFFECT RESULT   Chloride 103 98 - 111 mmol/L   CO2 19 (L) 22 - 32 mmol/L   Glucose, Bld 85 70 - 99 mg/dL    Comment: Glucose reference range applies only to samples taken after fasting for at least 8 hours.   BUN 15 6 - 20 mg/dL   Creatinine, Ser 1.05 0.61 - 1.24 mg/dL   Calcium 10.2 8.9 - 10.3 mg/dL   Total Protein 9.3 (H) 6.5 - 8.1 g/dL   Albumin 5.3 (H) 3.5 - 5.0 g/dL   AST 28 15 - 41 U/L  ALT 12 0 - 44 U/L   Alkaline Phosphatase 95 38 - 126 U/L   Total Bilirubin 1.3 (H) 0.3 - 1.2 mg/dL   GFR calc non Af Amer >60 >60 mL/min   GFR calc Af Amer >60 >60 mL/min   Anion gap 19 (H) 5 - 15    Comment: Performed at [facility], [address] [place], [place] [address]  CBC     Status: Abnormal   Collection Time: 07/03/19  1:39 PM  Result Value Ref Range   WBC 19.5 (H) 4.0 - 10.5 K/uL   RBC 6.56 (H) 4.22 - 5.81 MIL/uL   Hemoglobin 19.0 (H) 13.0 - 17.0 g/dL   HCT 78.453.3 (H) 69.639.0 - 29.552.0 %   MCV 81.3 80.0 - 100.0 fL   MCH 29.0 26.0 - 34.0 pg   MCHC 35.6 30.0 - 36.0 g/dL   RDW 28.412.3 13.211.5 - 44.015.5 %   Platelets 390 150 - 400 K/uL   nRBC 0.0 0.0 - 0.2 %    Comment: Performed at [facility], [address] [place], [place] [address]  Blood gas, venous      Status: None (Preliminary result)   Collection Time: 07/03/19  2:09 PM  Result Value Ref Range   pH, Ven 7.30 7.250 - 7.430   pCO2, Ven 53 44.0 - 60.0 mmHg   pO2, Ven PENDING 32.0 - 45.0 mmHg   Bicarbonate 26.1 20.0 - 28.0 mmol/L   Acid-base deficit 1.1 0.0 - 2.0 mmol/L   O2 Saturation 37.7 %   Patient temperature 37.0    Collection site VEIN    Sample type VENOUS     Comment: Performed at [facility], [address] [place], [place] [address]  Glucose, capillary     Status: None   Collection Time: 07/03/19  2:19 PM  Result Value Ref Range   Glucose-Capillary 77 70 - 99 mg/dL    Comment: Glucose reference range applies only to samples taken after fasting for at least 8 hours.  Glucose, capillary     Status: Abnormal   Collection Time: 07/03/19  3:09 PM  Result Value Ref Range   Glucose-Capillary 44 (LL) 70 - 99 mg/dL    Comment: Glucose reference range applies only to samples taken after fasting for at least 8 hours.  Urinalysis, Complete w Microscopic     Status: Abnormal   Collection Time: 07/03/19  3:24 PM  Result Value Ref Range   Color, Urine YELLOW (A) YELLOW   APPearance HAZY (A) CLEAR   Specific Gravity, Urine 1.018 1.005 - 1.030   pH 5.0 5.0 - 8.0   Glucose, UA NEGATIVE NEGATIVE mg/dL   Hgb urine dipstick NEGATIVE NEGATIVE   Bilirubin Urine NEGATIVE NEGATIVE   Ketones, ur 5 (A) NEGATIVE mg/dL   Protein, ur 30 (A) NEGATIVE mg/dL   Nitrite NEGATIVE NEGATIVE   Leukocytes,Ua NEGATIVE NEGATIVE   RBC / HPF 0-5 0 - 5 RBC/hpf   WBC, UA 0-5 0 - 5 WBC/hpf   Bacteria, UA NONE SEEN NONE SEEN   Squamous Epithelial / LPF 0-5 0 - 5   Mucus PRESENT     Comment: Performed at [facility], [address] [place], [place] [address]  Basic metabolic panel     Status: Abnormal   Collection Time: 07/03/19  3:43 PM  Result Value Ref Range   Sodium 143 135 - 145 mmol/L   Potassium 4.2 3.5 - 5.1 mmol/L    Comment: HEMOLYSIS AT  THIS LEVEL MAY AFFECT RESULT    Chloride 112 (H) 98 - 111 mmol/L   CO2 27 22 - 32 mmol/L   Glucose, Bld 128 (H) 70 - 99 mg/dL    Comment: Glucose reference range applies only to samples taken after fasting for at least 8 hours.   BUN 13 6 - 20 mg/dL   Creatinine, Ser 4.09 0.61 - 1.24 mg/dL   Calcium 7.5 (L) 8.9 - 10.3 mg/dL   GFR calc non Af Amer >60 >60 mL/min   GFR calc Af Amer >60 >60 mL/min   Anion gap 4 (L) 5 - 15    Comment: Performed at [facility], [address] [place], [place] [address]  Glucose, capillary     Status: Abnormal   Collection Time: 07/03/19  4:30 PM  Result Value Ref Range   Glucose-Capillary 125 (H) 70 - 99 mg/dL    Comment: Glucose reference range applies only to samples taken after fasting for at least 8 hours.  Glucose, capillary     Status: Abnormal   Collection Time: 07/03/19  5:56 PM  Result Value Ref Range   Glucose-Capillary 125 (H) 70 - 99 mg/dL    Comment: Glucose reference range applies only to samples taken after fasting for at least 8 hours.  Respiratory Panel by RT PCR (Flu A&B, Covid) - Nasopharyngeal Swab     Status: None   Collection Time: 07/03/19  8:42 PM   Specimen: Nasopharyngeal Swab  Result Value Ref Range   SARS Coronavirus 2 by RT PCR NEGATIVE NEGATIVE    Comment: (NOTE) SARS-CoV-2 target nucleic acids are NOT DETECTED. The SARS-CoV-2 RNA is generally detectable in upper respiratoy specimens during the acute phase of infection. The lowest concentration of SARS-CoV-2 viral copies this assay can detect is 131 copies/mL. A negative result does not preclude SARS-Cov-2 infection and should not be used as the sole basis for treatment or other patient management decisions. A negative result may occur with  improper specimen collection/handling, submission of specimen other than nasopharyngeal swab, presence of viral mutation(s) within the areas targeted by this assay, and inadequate number of viral copies (<131 copies/mL). A negative result must be  combined with clinical observations, patient history, and epidemiological information. The expected result is Negative. Fact Sheet for Patients:  https://www.moore.com/ Fact Sheet for Healthcare Providers:  https://www.young.biz/ This test is not yet ap proved or cleared by the [place] FDA and  has been authorized for detection and/or diagnosis of SARS-CoV-2 by FDA under an Emergency Use Authorization (EUA). This EUA will remain  in effect (meaning this test can be used) for the duration of the COVID-19 declaration under Section 564(b)(1) of the Act, 21 U.S.C. section 360bbb-3(b)(1), unless the authorization is terminated or revoked sooner.    Influenza A by PCR NEGATIVE NEGATIVE   Influenza B by PCR NEGATIVE NEGATIVE    Comment: (NOTE) The Xpert Xpress SARS-CoV-2/FLU/RSV assay is intended as an aid in  the diagnosis of influenza from Nasopharyngeal swab specimens and  should not be used as a sole basis for treatment. Nasal washings and  aspirates are unacceptable for Xpert Xpress SARS-CoV-2/FLU/RSV  testing. Fact Sheet for Patients: https://www.moore.com/ Fact Sheet for Healthcare Providers: https://www.young.biz/ This test is not yet approved or cleared by the [place] FDA and  has been authorized for detection and/or diagnosis of SARS-CoV-2 by  FDA under an Emergency Use Authorization (EUA). This EUA will remain  in effect (meaning this test can be used) for the  duration of the  Covid-19 declaration under Section 564(b)(1) of the Act, 21  U.S.C. section 360bbb-3(b)(1), unless the authorization is  terminated or revoked. Performed at [facility], [address] [place], [place] [address]   Glucose, capillary     Status: None   Collection Time: 07/03/19  9:42 PM  Result Value Ref Range   Glucose-Capillary 87 70 - 99 mg/dL    Comment: Glucose reference range applies only to samples  taken after fasting for at least 8 hours.   Comment 1 Document in Chart   Glucose, capillary     Status: Abnormal   Collection Time: 07/04/19 12:10 AM  Result Value Ref Range   Glucose-Capillary 202 (H) 70 - 99 mg/dL    Comment: Glucose reference range applies only to samples taken after fasting for at least 8 hours.   Comment 1 Document in Chart   Glucose, capillary     Status: Abnormal   Collection Time: 07/04/19  1:27 AM  Result Value Ref Range   Glucose-Capillary 239 (H) 70 - 99 mg/dL    Comment: Glucose reference range applies only to samples taken after fasting for at least 8 hours.   Comment 1 Document in Chart    US ABDOMEN LIMITED RUQ  Result Date: 07/03/2019 CLINICAL DATA:  Right upper quadrant pain with nausea and vomiting EXAM: ULTRASOUND ABDOMEN LIMITED RIGHT UPPER QUADRANT COMPARISON:  None. FINDINGS: Gallbladder: No gallstones or wall thickening visualized. No sonographic Murphy sign noted by sonographer. Common bile duct: Diameter: 3.2 mm Liver: No focal lesion identified. Within normal limits in parenchymal echogenicity. Portal vein is patent on color Doppler imaging with normal direction of blood flow towards the liver. Other: None. IMPRESSION: Negative right upper quadrant abdominal ultrasound Electronically Signed   By: Jasmine Pang M.D.   On: 07/03/2019 22:26    Pending Labs Unresulted Labs (From admission, onward)    Start     Ordered   07/04/19 0500  Basic metabolic panel  Tomorrow morning,   STAT     07/04/19 0001   07/04/19 0500  CBC  Tomorrow morning,   STAT     07/04/19 0001   07/04/19 0031  Hemoglobin A1c  Once,   STAT    Comments: To assess prior glycemic control    07/04/19 0031   07/04/19 0004  Urine Drug Screen, Qualitative ([facility] only)  Add-on,   AD     07/04/19 0003   07/03/19 2359  HIV Antibody (routine testing w rflx)  (HIV Antibody (Routine testing w reflex) panel)  Once,   STAT     07/04/19 0001          Vitals/Pain Today's Vitals    07/04/19 0030 07/04/19 0100 07/04/19 0133 07/04/19 0134  BP: 121/74 128/84 128/78   Pulse: (!) 124 (!) 115 (!) 119   Resp: (!) 21 (!) 21 20   Temp:      TempSrc:      SpO2: 98% 98% 98%   Weight:      Height:      PainSc:    Asleep    Isolation Precautions No active isolations  Medications Medications  0.9 %  sodium chloride infusion ( Intravenous New Bag/Given 07/04/19 0005)  promethazine (PHENERGAN) injection 12.5 mg (has no administration in time range)  insulin aspart (novoLOG) injection 0-15 Units (has no administration in time range)  0.9 %  sodium chloride infusion ( Intravenous Stopped 07/03/19 1635)  ondansetron (ZOFRAN) injection 4 mg (4  mg Intravenous Given 07/03/19 1434)  LORazepam (ATIVAN) injection 1 mg (1 mg Intravenous Given 07/03/19 1434)  0.9 %  sodium chloride infusion ( Intravenous Stopped 07/03/19 1635)  ondansetron (ZOFRAN) injection 4 mg (4 mg Intravenous Given 07/03/19 1717)  LORazepam (ATIVAN) injection 1 mg (1 mg Intravenous Given 07/03/19 1746)  metoCLOPramide (REGLAN) injection 10 mg (10 mg Intravenous Given 07/03/19 1930)  pantoprazole (PROTONIX) injection 40 mg (40 mg Intravenous Given 07/03/19 2128)  promethazine (PHENERGAN) injection 12.5 mg (12.5 mg Intravenous Given 07/03/19 2227)    Mobility walks Low fall risk   Focused Assessments Pulmonary Assessment Handoff:  Lung sounds:   O2 Device: Room Air        R Recommendations: See Admitting Provider Note  Report given to:   Additional Notes: last CBG 239

## 2019-07-04 NOTE — Discharge Summary (Signed)
**Note De-Identified vi Obfusction** Physicin Dischrge Summry  Leroy Se. WCB:[id number] DOB: 06-Aug-2000 DOA: 07/03/2019  PCP: [facility] dte: 07/03/2019 Dischrge dte: 07/04/2019  Time spent: 40 minutes  Recommendtions for Outptient Follow-up:  1. Follow outptient cbc/cmp 2. Follow tchycrdi, improved t dc 3. 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 is  19 y.o. mle with medicl history significnt for Type 1 dibetes nd  ADHD who presents with persistent nuse nd vomiting.   He woke up this morning feeling nuse nd then round 11AM he strted to hve persistent vomiting. Vomitus ws non-bloody but bilious. Only hs bdomen pin during vomiting episodes. He lso hd dirrhe in the ED. Denies ny fever but felt chills. No respirtory symptoms including cough, runny nose or shortness of breth. No sick contct t home but he works with [employer]. He took 23 units of 70/30 this morning but hs not hd much to et since.  Also took his Adderll.  Denies tobcco, lcohol or illicit drug use.  ED Course: He ws febrile, tchycrdic up to 120s nd normotensive on room ir.  He hd leukocytosis of 19.5K, hemoglobin of 19 which is hemoconcentrted.  Glucose of 85, nion gp of 19.  Norml LFTs.  Mildly elevted totl bilirubin 1.3.  Negtive UA.  Negtive right upper qudrnt ultrsound  He received 2L of IV NS bolus, 54m of Ativn, 117mof Regln, Zofrn x2, PPI, nd phenergn in the ED.  He ws dmitted for tchycrdi 2/2 n/v/d thought due to gstroenteritis.  He improved nd ws dischrged on 3/16 with n djusted insulin regimen.  Hospitl Course:  Tchycrdi  likely secondry to hypovolemi nd Adderll which he tkes for ADHD Tchycrdi improved with IVF utox with mphetmines nd benzos Improved t time of dischrge  Nuse /vomiting likely secondry to gstroenteritis Negtive right upper qudrnt ultrsound Negtive Covid test Improved, tolerting PO t time of dischrge  Type 1 dibetes Will dischrge with tresib 25 units dily nd 8 units sprt TID which he ws previously on  ADHD hold Adderll   Procedures: None  Consulttions:  none  Dischrge Exm: Vitls:   07/04/19 1200 07/04/19 1656  BP: 114/75 115/82  Pulse: (!) 107 (!) 103  Resp: 20 16  Temp: 98 F (36.7 C) 98 F (36.7 C)  SpO2: 99% 100%   Feels better Redy for dischrge Discussed d/c recs   Generl: No cute distress. Crdiovsculr: Hert sounds show  regulr rte, nd rhythm.  Lungs: Cler to usculttion bilterlly  Abdomen: Soft, nontender, nondistended Neurologicl: Alert nd oriented 3. Moves ll extremities 4. Crnil nerves II through XII grossly intct. Skin: Wrm nd dry. No rshes or lesions. Extremities: No clubbing or cynosis. No edem   Dischrge Instructions   Dischrge Instructions    Cll MD for:  difficulty brething, hedche or visul disturbnces   Complete by: As directed    Cll MD for:  extreme ftigue   Complete by: As directed    Cll MD for:  hives   Complete by: As directed    Cll MD for:  persistnt dizziness or light-hededness   Complete by: As directed    Cll MD for: **Note De-Identified vi Obfusction** persistnt nuse nd vomiting   Complete by: As directed    Cll MD for:  redness, tenderness, or signs of infection (pin, swelling, redness, odor or green/yellow dischrge round incision site)   Complete by: As directed    Cll MD for:  severe uncontrolled pin   Complete by: As directed    Cll MD for:  temperture >100.4   Complete by: As directed    Diet - low sodium hert helthy   Complete by: As directed    Dischrge  instructions   Complete by: As directed    You were dmitted for nuse, vomiting, nd dirrhe.   You've improved since dmission.  You likely hd  stomch bug (gstroenteritis).  Your hert rte ws elevted t presenttion, but is improving.  Plese follow up with your outptient provider regrding this.  We're chnging your insulin bck to tresib 25 units dily nd novolog 8 units three times  dy with mels.  Plese wtch for low blood sugrs.  Hold your meltime insulin if you're not eting.  Return for new, recurrent, or worsening symptoms.  Plese sk your PCP to request records from this hospitliztion so they know wht ws done nd wht the next steps will be.   Increse ctivity slowly   Complete by: As directed      Allergies s of 07/04/2019   No Known Allergies     Mediction List    STOP tking these medictions   insulin sprt protmine- sprt (70-30) 100 UNIT/ML injection Commonly known s: NOVOLOG MIX 70/30     TAKE these medictions   mphetmine-dextromphetmine 10 MG tblet Commonly known s: ADDERALL Tke 10 mg by mouth dily t 2 PM.   mphetmine-dextromphetmine 10 MG 24 hr cpsule Commonly known s: ADDERALL XR Tke 10 mg by mouth every morning.   blood glucose meter kit nd supplies Kit Dispense bsed on ptient nd insurnce preference. Use up to four times dily s directed. (FOR ICD-9 250.00, 250.01).   insulin sprt 100 UNIT/ML FlexPen Commonly known s: NOVOLOG Inject 8 Units into the skin 3 (three) times dily with mels.   insulin degludec 100 UNIT/ML FlexTouch Pen Commonly known s: TRESIBA Inject 0.25 mLs (25 Units totl) into the skin dily. Wht chnged: when to tke this   Insulin Pen Needle 32G X 4 MM Misc Use with insulin      No Known Allergies Follow-up Informtion    Schedule n ppointment s soon s possible for  visit  with [facility]   Specilty: Generl Prctice Why: As  needed Contct informtion: [address] [place] [id number] [phone]            The results of significnt dignostics from this hospitliztion (including imging, microbiology, ncillry nd lbortory) re listed below for reference.    Significnt Dignostic Studies: [place] ABDOMEN LIMITED RUQ  Result Dte: 07/03/2019 CLINICAL DATA:  Right upper qudrnt pin with nuse nd vomiting EXAM: ULTRASOUND ABDOMEN LIMITED RIGHT UPPER QUADRANT COMPARISON:  None. FINDINGS: Gllbldder: No gllstones or wll thickening visulized. No sonogrphic Murphy sign noted by sonogrpher. Common bile duct: Dimeter: 3.2 mm Liver: No focl lesion identified. Within norml limits in prenchyml echogenicity. Portl vein is ptent on color Doppler imging with norml direction of blood flow towrds the liver. Other: None. IMPRESSION: Negtive right upper qudrnt bdominl ultrsound Electroniclly Signed   By: Donvn Foil M.D.   On: 07/03/2019 22:26    Microbiology: Recent Results (from the pst **Note De-Identified vi Obfusction** 240 hour(s))  Respirtory Pnel by RT PCR (Flu &B, Covid) - Nsophryngel Swb     Sttus: None   Collection Time: 07/03/19  8:42 PM   Specimen: Nsophryngel Swb  Result Vlue Ref Rnge Sttus   SRS Coronvirus 2 by RT PCR NEGTIVE NEGTIVE Finl    Comment: (NOTE) SRS-CoV-2 trget nucleic cids re NOT DETECTED. The SRS-CoV-2 RN is generlly detectble in upper respirtoy specimens during the cute phse of infection. The lowest concentrtion of SRS-CoV-2 virl copies this ssy cn detect is 131 copies/mL.  negtive result does not preclude SRS-Cov-2 infection nd should not be used s the sole bsis for tretment or other ptient mngement decisions.  negtive result my occur with  improper specimen collection/hndling, submission of specimen other thn nsophryngel swb, presence of virl muttion(s) within the res trgeted by this ssy, nd indequte number  of virl copies (<131 copies/mL).  negtive result must be combined with clinicl observtions, ptient history, nd epidemiologicl informtion. The expected result is Negtive. Fct Sheet for Ptients:  PinkCheek.be Fct Sheet for [place] Providers:  GrvelBgs.it This test is not yet p proved or clered by the [place] FD nd  hs been uthorized for detection nd/or dignosis of SRS-CoV-2 by FD under n Emergency Use uthoriztion (EU). This EU will remin  in effect (mening this test cn be used) for the durtion of the COVID-19 declrtion under Section 564(b)(1) of the ct, 21 U.S.C. section 360bbb-3(b)(1), unless the uthoriztion is terminted or revoked sooner.    Influenz  by PCR NEGTIVE NEGTIVE Finl   Influenz B by PCR NEGTIVE NEGTIVE Finl    Comment: (NOTE) The Xpert Xpress SRS-CoV-2/FLU/RSV ssy is intended s n id in  the dignosis of influenz from Nsophryngel swb specimens nd  should not be used s  sole bsis for tretment. Nsl wshings nd  spirtes re uncceptble for Xpert Xpress SRS-CoV-2/FLU/RSV  testing. Fct Sheet for Ptients: PinkCheek.be Fct Sheet for [place] Providers: GrvelBgs.it This test is not yet pproved or clered by the [place] FD nd  hs been uthorized for detection nd/or dignosis of SRS-CoV-2 by  FD under n Emergency Use uthoriztion (EU). This EU will remin  in effect (mening this test cn be used) for the durtion of the  Covid-19 declrtion under Section 564(b)(1) of the ct, 21  U.S.C. section 360bbb-3(b)(1), unless the uthoriztion is  terminted or revoked. Performed t [facility] [place] [address] [place], [place] [address]      Lbs: Bsic Metbolic Pnel: Recent Lbs  Lb 07/03/19 1339 07/03/19 1543 07/04/19 0645  N 141 143 138  K 3.6  4.2 3.6  CL 103 112* 105  CO2 19* 27 23  GLUCOSE 85 128* 309*  BUN 15 13 13   CRETININE 1.05 0.82 0.69  CLCIUM 10.2 7.5* 7.6*   Liver Function Tests: Recent Lbs  Lb 07/03/19 1339  ST 28  LT 12  LKPHOS 95  BILITOT 1.3*  PROT 9.3*  LBUMIN 5.3*   Recent Lbs  Lb 07/03/19 1339  LIPSE 15   No results for input(s): MMONI in the lst 168 hours. CBC: Recent Lbs  Lb 07/03/19 1339 07/04/19 0645  WBC 19.5* 10.8*  HGB 19.0* 13.8  HCT 53.3* 41.4  MCV 81.3 84.7  PLT 390 221   Crdic Enzymes: No results for input(s): CKTOTL, CKMB, CKMBINDEX, TROPONINI in the lst 168 hours. BNP: BNP (lst 3 results) No results for input(s): BNP in the lst 8760 hours.  ProBNP (lst 3 results) No results for input(s): PROBNP in the last 8760 hours.  CBG: Recent Labs  Lab 07/04/19 0743 07/04/19 0948 07/04/19 1128 07/04/19 1503 07/04/19 1638  GLUCAP 301* 222* 233* 222* 171*       Signed:  Fayrene Helper MD.  [facility] 07/04/2019, 5:10 PM

## 2019-07-04 NOTE — [place] Notes (Signed)
Inpatient Diabetes Program Recommendations  AACE/ADA: New Consensus Statement on Inpatient Glycemic Control (2015)  Target Ranges:  Prepandial:   less than 140 mg/dL      Peak postprandial:   less than 180 mg/dL (1-2 hours)      Critically ill patients:  140 - 180 mg/dL   Lab Results  Component Value Date   GLUCAP 222 (H) 07/04/2019   HGBA1C 11.8 (H) 07/04/2019    Review of Glycemic Control  Diabetes history: DM1 Outpatient Diabetes medications: 70/30 23 units am + 21 units pm Current orders for Inpatient glycemic control: Levemir 10 units bid + Novolog sensitive correction q 4 hrs.  Inpatient Diabetes Program Recommendations:   Spoke with patient regarding diabetes management. Patient shared that his injection of Evaristo Bury was slightly uncomfortable when he took injection but no rash or continued discomfort. Patient has Medicaid and states willingness to return to previous regimen of Tresiba 25 units daily + Humalog 12 units tid meal coverage and return appointment to [facility] endocrinologist next week for followup. Patient took the 70/30 insulin prior to admission and didn't eat so his CBG was low. Addressed A1c of 11.8 (average blood glucose 292 over the past 2-3 months). Reviewed risks of elevated blood glucose encouraged to check CBGs qid and report to endocrinologist and PCP on appointments. Patient needs new glucometer. Discharge needs: -CBG meter & supplies #[id number] -Pen Needles #[id number]  Thank you, Billy Fischer. , RN, MSN, CDE  Diabetes Coordinator Inpatient Glycemic Control Team Team Pager [phone] (8am-5pm) 07/04/2019 10:50 AM

## 2019-07-04 NOTE — ED Notes (Signed)
Patient is resting at this time, no complaints. Provided with pillow, notified to call if any needs arise

## 2019-07-12 LAB — BLOOD GAS, VENOUS
Acid-base deficit: 1.1 mmol/L (ref 0.0–2.0)
Bicarbonate: 26.1 mmol/L (ref 20.0–28.0)
O2 Saturation: 37.7 %
Patient temperature: 37
pCO2, Ven: 53 mmHg (ref 44.0–60.0)
pH, Ven: 7.3 (ref 7.250–7.430)

## 2020-09-30 ENCOUNTER — Emergency Department
Admission: EM | Admit: 2020-09-30 | Discharge: 2020-09-30 | Disposition: A | Discharge status: Home / Self Care | Payer: Medicaid Other | Attending: Emergency Medicine | Admitting: Emergency Medicine

## 2020-09-30 ENCOUNTER — Other Ambulatory Visit: Payer: Self-pay

## 2020-09-30 DIAGNOSIS — Z794 Long term (current) use of insulin: Secondary | ICD-10-CM | POA: Insufficient documentation

## 2020-09-30 DIAGNOSIS — E1065 Type 1 diabetes mellitus with hyperglycemia: Secondary | ICD-10-CM | POA: Insufficient documentation

## 2020-09-30 DIAGNOSIS — R739 Hyperglycemia, unspecified: Secondary | ICD-10-CM | POA: Diagnosis present

## 2020-09-30 DIAGNOSIS — E1010 Type 1 diabetes mellitus with ketoacidosis without coma: Secondary | ICD-10-CM | POA: Diagnosis not present

## 2020-09-30 DIAGNOSIS — Y908 Blood alcohol level of 240 mg/100 ml or more: Secondary | ICD-10-CM | POA: Insufficient documentation

## 2020-09-30 LAB — BASIC METABOLIC PANEL
Anion gap: 8 (ref 5–15)
BUN: 18 mg/dL (ref 6–20)
CO2: 23 mmol/L (ref 22–32)
Calcium: 9.4 mg/dL (ref 8.9–10.3)
Chloride: 98 mmol/L (ref 98–111)
Creatinine, Ser: 0.94 mg/dL (ref 0.61–1.24)
GFR, Estimated: >60 mL/min (ref >60)
Glucose, Bld: 572 mg/dL (ref 70–99)
Potassium: 4.5 mmol/L (ref 3.5–5.1)
Sodium: 129 mmol/L — ABNORMAL LOW (ref 135–145)

## 2020-09-30 LAB — CBG MONITORING, ED
Glucose-Capillary: 557 mg/dL (ref 70–99)
Glucose-Capillary: 409 mg/dL — ABNORMAL HIGH (ref 70–99)
Glucose-Capillary: 261 mg/dL — ABNORMAL HIGH (ref 70–99)

## 2020-09-30 LAB — BETA-HYDROXYBUTYRIC ACID: Beta-Hydroxybutyric Acid: 0.64 mmol/L — ABNORMAL HIGH (ref 0.05–0.27)

## 2020-09-30 LAB — CBC
HCT: 45.5 % (ref 39.0–52.0)
Hemoglobin: 15.4 g/dL (ref 13.0–17.0)
MCH: 28.2 pg (ref 26.0–34.0)
MCHC: 33.8 g/dL (ref 30.0–36.0)
MCV: 83.3 fL (ref 80.0–100.0)
Platelets: 272 10*3/uL (ref 150–400)
RBC: 5.46 MIL/uL (ref 4.22–5.81)
RDW: 12.4 % (ref 11.5–15.5)
WBC: 7.1 10*3/uL (ref 4.0–10.5)
nRBC: 0 % (ref 0.0–0.2)

## 2020-09-30 MED ORDER — LACTATED RINGERS IV BOLUS
1000.0000 mL | Freq: Once | INTRAVENOUS | Status: AC
  Administered 2020-09-30: 1000 mL via INTRAVENOUS

## 2020-09-30 MED ORDER — INSULIN ASPART 100 UNIT/ML IJ SOLN
10.0000 [IU] | Freq: Once | INTRAMUSCULAR | Status: AC
  Administered 2020-09-30: 10 [IU] via SUBCUTANEOUS
  Filled 2020-09-30: qty 1

## 2020-09-30 MED ORDER — INSULIN ASPART 100 UNIT/ML IJ SOLN
5.0000 [IU] | Freq: Once | INTRAMUSCULAR | Status: AC
  Administered 2020-09-30: 5 [IU] via SUBCUTANEOUS
  Filled 2020-09-30: qty 1

## 2020-09-30 NOTE — ED Provider Notes (Signed)
[facility] Emergency Department Provider Note  ____________________________________________   Event Date/Time   First MD Initiated Contact with Patient 09/30/20 0355     (approximate)  I have reviewed the triage vital signs and the nursing notes.   HISTORY  Chief Complaint Hyperglycemia    HPI Thomas Faughn. is a 20 y.o. male with type 1 diabetes who presents for evaluation of hyperglycemia.  He said that he is out of his home insulin and he tried to pick it up from the pharmacy today but was told by the pharmacy that they were out and would not get resupplied until the morning.  His blood glucose continue to trend up over the course of the evening and by tonight it measures greater than 500.  He called his nurse line at [facility] and was told to come to the emergency department before he gets any worse.  He denies any symptoms.  Specifically he denies generalized weakness, nausea, vomiting, abdominal pain, shortness of breath, dysuria.  He describes the hyperglycemia as severe and nothing particular is making it better or worse.  He said that he does have a prescription filled and that he should be able to pick it up in the morning but he was afraid he would be much more sick by that time.     Past Medical History:  Diagnosis Date   ADHD (attention deficit hyperactivity disorder)    Diabetes mellitus without complication [facility])     Patient Active Problem List   Diagnosis Date Noted   Intractable vomiting with nausea 07/04/2019   ADHD 07/04/2019   Tachycardia 07/04/2019   Type 1 diabetes mellitus without complication ([place]) 50/35/4656   AKI (acute kidney injury) ([place]) 02/09/2016   DKA, type 1 ([place]) 03/27/2015   DKA (diabetic ketoacidoses) 03/17/2015    Past Surgical History:  Procedure Laterality Date   NO PAST SURGERIES      Prior to Admission medications   Medication Sig Start Date End Date Taking? Authorizing Provider   amphetamine-dextroamphetamine (ADDERALL XR) 10 MG 24 hr capsule Take 10 mg by mouth every morning.     [provider]  amphetamine-dextroamphetamine (ADDERALL) 10 MG tablet Take 10 mg by mouth daily at 2 PM.     [provider]  blood glucose meter kit and supplies KIT Dispense based on patient and insurance preference. Use up to four times daily as directed. (FOR ICD-9 250.00, 250.01). 07/04/19   Elodia Florence., MD  insulin aspart (NOVOLOG) 100 UNIT/ML FlexPen Inject 8 Units into the skin 3 (three) times daily with meals. 07/04/19   Elodia Florence., MD  Insulin Pen Needle 32G X 4 MM MISC Use with insulin 07/04/19   Elodia Florence., MD    Allergies Patient has no known allergies.  Family History  Problem Relation Age of Onset   Hypertension Other     Social History Social History   Tobacco Use   Smoking status: Never   Smokeless tobacco: Never  Vaping Use   Vaping Use: Never used  Substance Use Topics   Alcohol use: No   Drug use: No    Review of Systems Constitutional: Positive for hyperglycemia.  No fever/chills Eyes: No visual changes. ENT: No sore throat. Cardiovascular: Denies chest pain. Respiratory: Denies shortness of breath. Gastrointestinal: No abdominal pain.  No nausea, no vomiting.  No diarrhea.  No constipation. Genitourinary: Negative for dysuria. Musculoskeletal: Negative for neck pain.  Negative for back pain.  Integumentary: Negative for rash. Neurological: Negative for headaches, focal weakness or numbness.   ____________________________________________   PHYSICAL EXAM:  VITAL SIGNS: ED Triage Vitals  Enc Vitals Group     BP 09/30/20 0325 (!) 128/104     Pulse Rate 09/30/20 0325 82     Resp 09/30/20 0325 16     Temp 09/30/20 0325 98.3 F (36.8 C)     Temp src --      SpO2 09/30/20 0325 100 %     Weight 09/30/20 0326 82.1 kg (181 lb)     Height 09/30/20 0326 1.676 m (5' 6" )     Head Circumference --       Peak Flow --      Pain Score 09/30/20 0326 0     Pain Loc --      Pain Edu? --      Excl. in [place]? --     Constitutional: Alert and oriented.  Eyes: Conjunctivae are normal.  Head: Atraumatic. Nose: No congestion/rhinnorhea. Mouth/Throat: Patient is wearing a mask. Neck: No stridor.  No meningeal signs.   Cardiovascular: Normal rate, regular rhythm. Good peripheral circulation. Respiratory: Normal respiratory effort.  No retractions. Gastrointestinal: Soft and nontender. No distention.  Musculoskeletal: No lower extremity tenderness nor edema. No gross deformities of extremities. Neurologic:  Normal speech and language. No gross focal neurologic deficits are appreciated.  Skin:  Skin is warm, dry and intact. Psychiatric: Mood and affect are normal. Speech and behavior are normal.  ____________________________________________   LABS (all labs ordered are listed, but only abnormal results are displayed)  Labs Reviewed  BASIC METABOLIC PANEL - Abnormal; Notable for the following components:      Result Value   Sodium 129 (*)    Glucose, Bld 572 (*)    All other components within normal limits  BETA-HYDROXYBUTYRIC ACID - Abnormal; Notable for the following components:   Beta-Hydroxybutyric Acid 0.64 (*)    All other components within normal limits  CBG MONITORING, ED - Abnormal; Notable for the following components:   Glucose-Capillary 557 (*)    All other components within normal limits  CBG MONITORING, ED - Abnormal; Notable for the following components:   Glucose-Capillary 409 (*)    All other components within normal limits  CBG MONITORING, ED - Abnormal; Notable for the following components:   Glucose-Capillary 261 (*)    All other components within normal limits  CBC   ____________________________________________   INITIAL IMPRESSION / MDM / [place] / ED COURSE  As part of my medical decision making, I reviewed the following data within the [facility] notes reviewed and incorporated, Labs reviewed , Old chart reviewed, and Notes from prior ED visits   Differential diagnosis includes, but is not limited to, hyperglycemia, DKA, HHS, other metabolic or electrolyte abnormality.  Patient's vital signs are stable.  He is asymptomatic other than the hyperglycemia.  His blood glucose is nearly 301 on his metabolic panel but his anion gap is normal.  He has a very slightly elevated beta hydroxybutyric acid but he is not acidotic and has a normal anion gap with no clinical evidence to support a diagnosis of DKA.  His CBC is normal.  I saw the medical record that he has been taking 8 units of regular insulin 3 times a day with meals.  Given his hyperglycemia I ordered 1 L LR bolus and 10 units of regular insulin subcutaneously.  We will recheck a fingerstick after  the administration of fluids and see how he is doing at that point.  He should be able to be discharged to follow-up with the pharmacy in the morning.       Clinical Course as of 09/30/20 0721  Mon Sep 30, 2020  0705 Glucose-Capillary(!): 261 Will discharge for outpatient follow up [CF]    Clinical Course User Index [CF] Hinda Kehr, MD     ____________________________________________  FINAL CLINICAL IMPRESSION(S) / ED DIAGNOSES  Final diagnoses:  Hyperglycemia     MEDICATIONS GIVEN DURING THIS VISIT:  Medications  lactated ringers bolus 1,000 mL (0 mLs Intravenous Stopped 09/30/20 0455)  insulin aspart (novoLOG) injection 10 Units (10 Units Subcutaneous Given 09/30/20 0413)  lactated ringers bolus 1,000 mL (1,000 mLs Intravenous New Bag/Given 09/30/20 0602)  insulin aspart (novoLOG) injection 5 Units (5 Units Subcutaneous Given 09/30/20 0602)     ED Discharge Orders     None        Note:  This document was prepared using Dragon voice recognition software and may include unintentional dictation errors.   Hinda Kehr, MD 09/30/20 [phone]

## 2020-09-30 NOTE — Discharge Instructions (Addendum)
Please pick up your insulin from the pharmacy this morning and follow-up with your regular doctor.

## 2020-09-30 NOTE — ED Triage Notes (Signed)
Pt states has not had any insulin since 1230. Pt states he does not have any insulin at home due to a drug store shortage. Pt states sugar is over 500 now.

## 2020-09-30 NOTE — ED Notes (Signed)
Discharge intuitions reviewed with pt , pt calm , collective , denied pain or sob

## 2020-10-06 ENCOUNTER — Emergency Department
Admission: EM | Admit: 2020-10-06 | Discharge: 2020-10-07 | Disposition: A | Discharge status: Home / Self Care | Payer: Medicaid Other | Attending: Emergency Medicine | Admitting: Emergency Medicine

## 2020-10-06 ENCOUNTER — Other Ambulatory Visit: Payer: Self-pay

## 2020-10-06 ENCOUNTER — Emergency Department: Payer: Medicaid Other

## 2020-10-06 DIAGNOSIS — R739 Hyperglycemia, unspecified: Secondary | ICD-10-CM

## 2020-10-06 DIAGNOSIS — R1012 Left upper quadrant pain: Secondary | ICD-10-CM

## 2020-10-06 DIAGNOSIS — Z794 Long term (current) use of insulin: Secondary | ICD-10-CM | POA: Diagnosis not present

## 2020-10-06 DIAGNOSIS — K2900 Acute gastritis without bleeding: Secondary | ICD-10-CM | POA: Insufficient documentation

## 2020-10-06 DIAGNOSIS — R109 Unspecified abdominal pain: Secondary | ICD-10-CM | POA: Diagnosis present

## 2020-10-06 DIAGNOSIS — E1010 Type 1 diabetes mellitus with ketoacidosis without coma: Secondary | ICD-10-CM | POA: Insufficient documentation

## 2020-10-06 DIAGNOSIS — E1065 Type 1 diabetes mellitus with hyperglycemia: Secondary | ICD-10-CM | POA: Diagnosis not present

## 2020-10-06 LAB — COMPREHENSIVE METABOLIC PANEL
ALT: 16 U/L (ref 0–44)
AST: 23 U/L (ref 15–41)
Albumin: 4.3 g/dL (ref 3.5–5.0)
Alkaline Phosphatase: 69 U/L (ref 38–126)
Anion gap: 10 (ref 5–15)
BUN: 15 mg/dL (ref 6–20)
CO2: 23 mmol/L (ref 22–32)
Calcium: 9.1 mg/dL (ref 8.9–10.3)
Chloride: 100 mmol/L (ref 98–111)
Creatinine, Ser: 1.01 mg/dL (ref 0.61–1.24)
GFR, Estimated: >60 mL/min (ref >60)
Glucose, Bld: 457 mg/dL — ABNORMAL HIGH (ref 70–99)
Potassium: 4.3 mmol/L (ref 3.5–5.1)
Sodium: 133 mmol/L — ABNORMAL LOW (ref 135–145)
Total Bilirubin: 0.7 mg/dL (ref 0.3–1.2)
Total Protein: 7.5 g/dL (ref 6.5–8.1)

## 2020-10-06 LAB — LIPASE, BLOOD: Lipase: 19 U/L (ref 11–51)

## 2020-10-06 LAB — CBC
HCT: 44.1 % (ref 39.0–52.0)
Hemoglobin: 14.8 g/dL (ref 13.0–17.0)
MCH: 28.2 pg (ref 26.0–34.0)
MCHC: 33.6 g/dL (ref 30.0–36.0)
MCV: 84.2 fL (ref 80.0–100.0)
Platelets: 174 10*3/uL (ref 150–400)
RBC: 5.24 MIL/uL (ref 4.22–5.81)
RDW: 12.9 % (ref 11.5–15.5)
WBC: 5.1 10*3/uL (ref 4.0–10.5)
nRBC: 0 % (ref 0.0–0.2)

## 2020-10-06 IMAGING — CT CT ABD-PELV W/O CM
2 of 4 series · 16 of 46 positions shown, 18 images · non-contrast
Comparison: None.

CLINICAL DATA: Abdominal pain.

EXAM:
CT ABDOMEN AND PELVIS WITHOUT CONTRAST
TECHNIQUE: Multidetector CT imaging of the abdomen and pelvis was performed
following the standard protocol without IV contrast.

[Series 2: routine abd/pel wo · axial · 0.84mm/px · z∈[-1007,-547]mm · 13 of 100 slices shown, 15 images]
[im 4/100  soft-tissue]
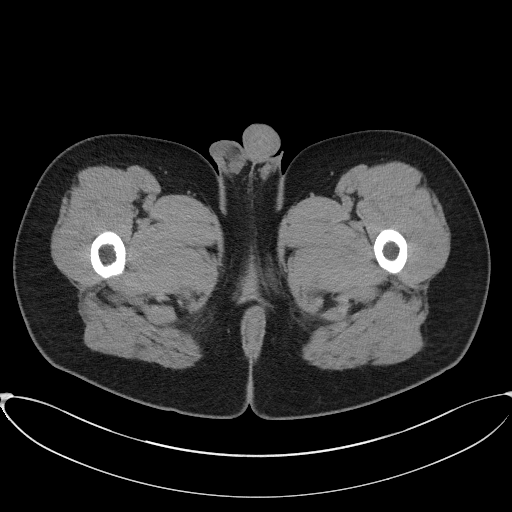
[im 4/100  bone]
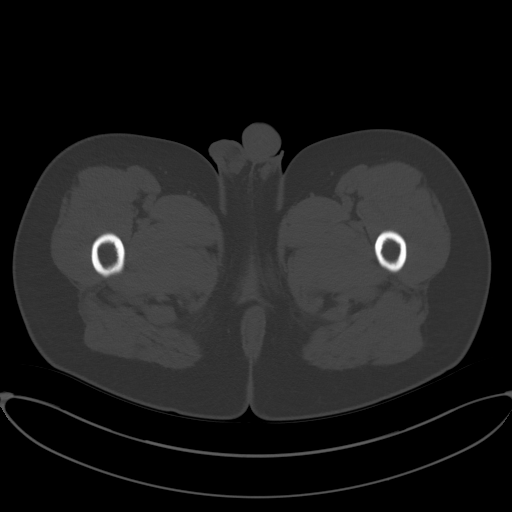
[im 12/100  soft-tissue]
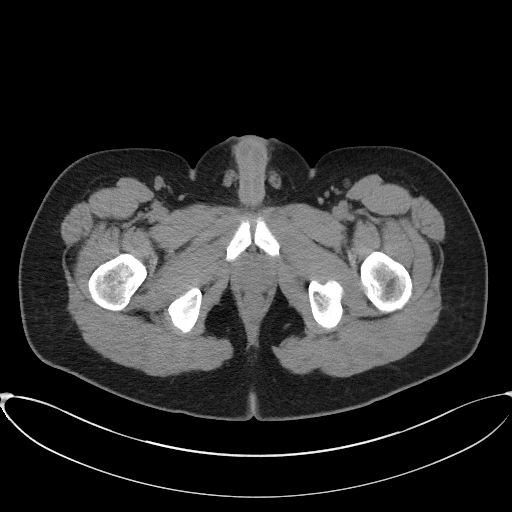
[im 20/100  soft-tissue]
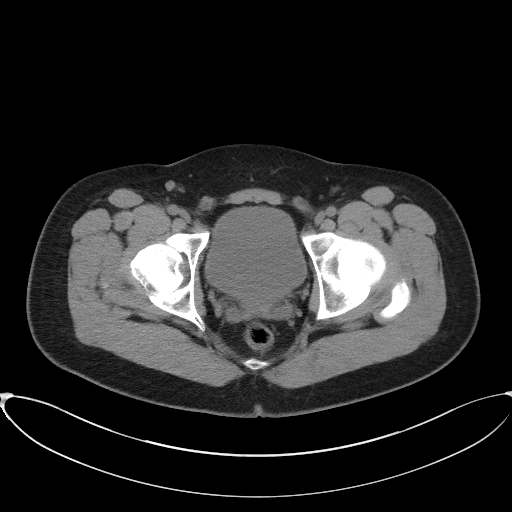
[im 28/100  soft-tissue]
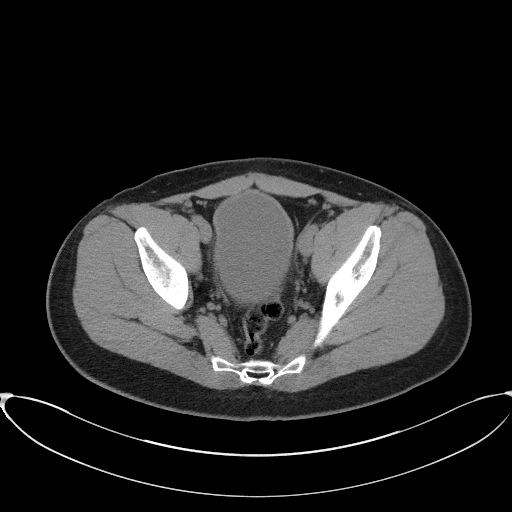
[im 36/100  soft-tissue]
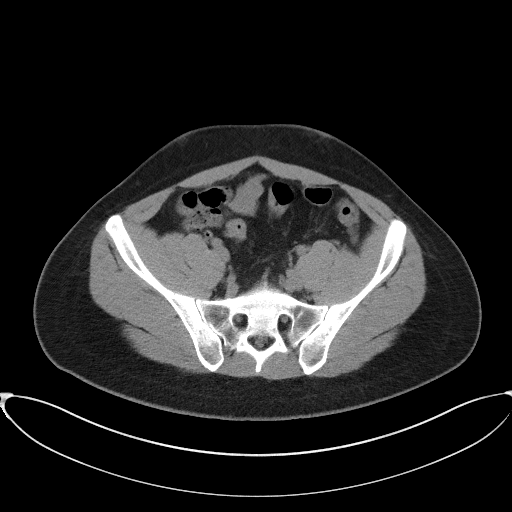
[im 44/100  soft-tissue]
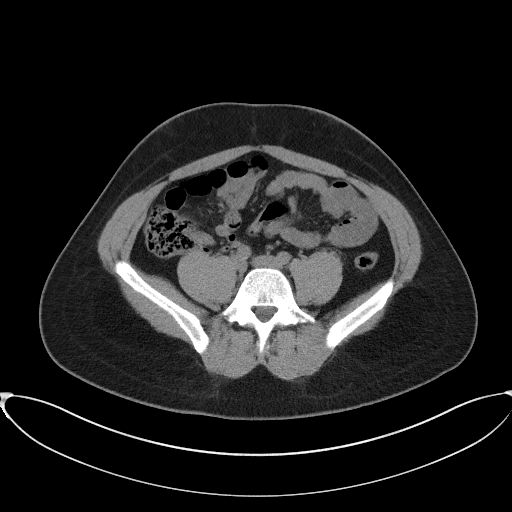
[im 52/100  soft-tissue]
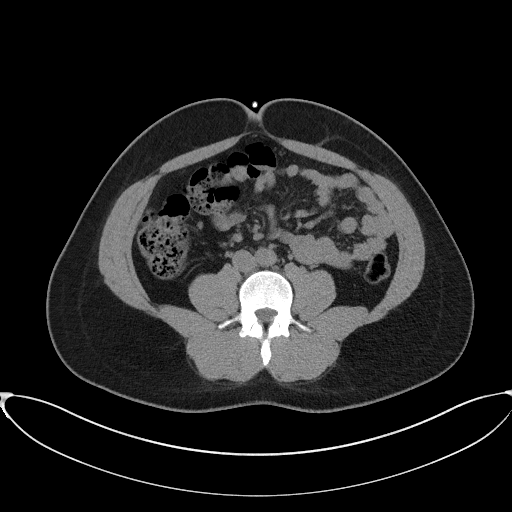
[im 56/100  soft-tissue]
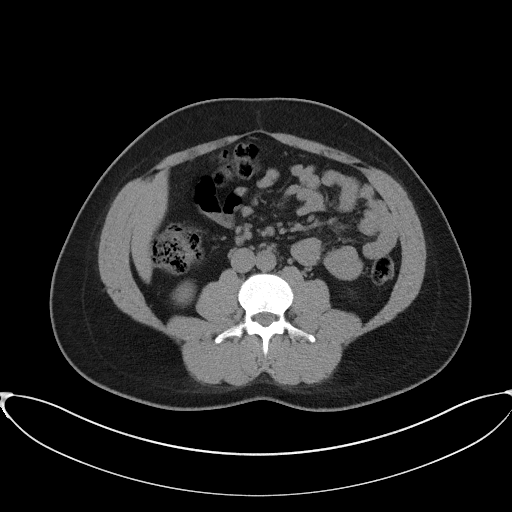
[im 64/100  soft-tissue]
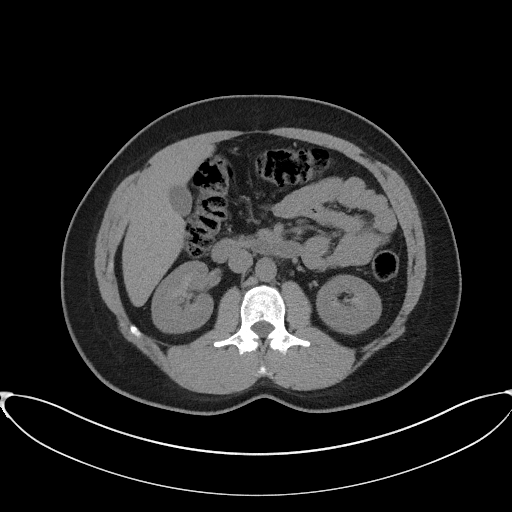
[im 64/100  bone]
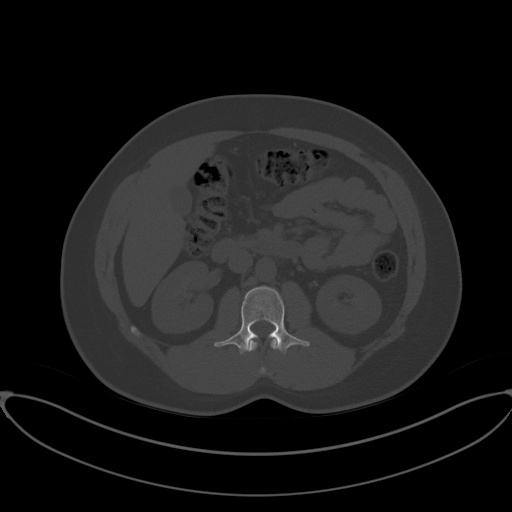
[im 72/100  soft-tissue]
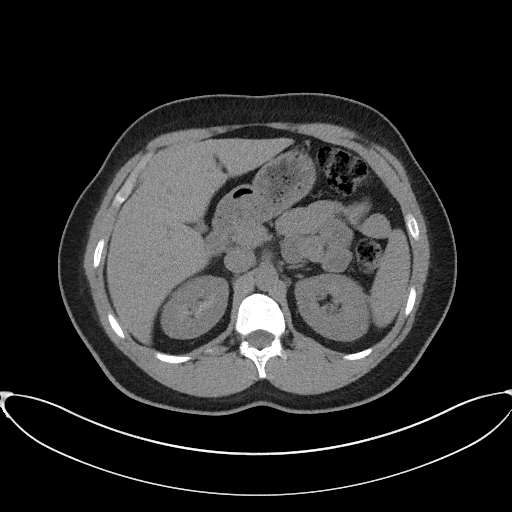
[im 80/100  soft-tissue]
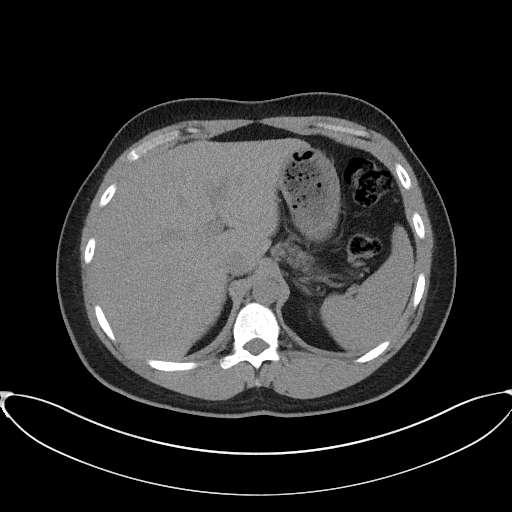
[im 88/100  soft-tissue]
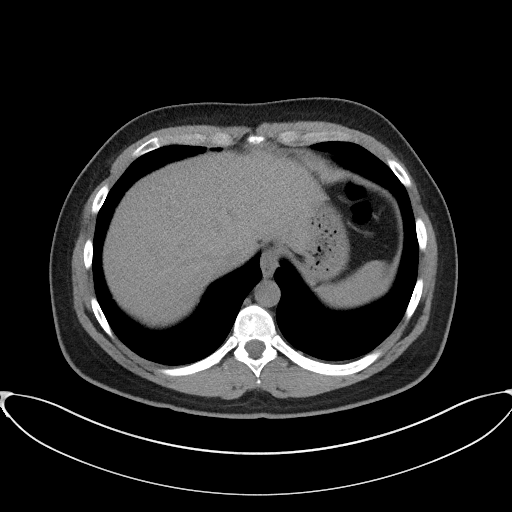
[im 96/100  soft-tissue]
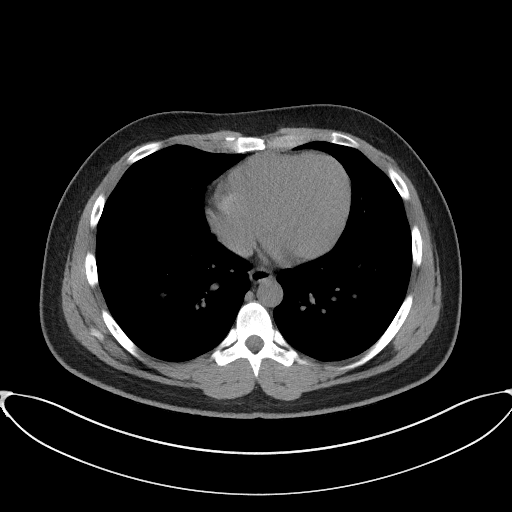

[Series 5: coronal st · coronal · 0.79mm/px · 3 of 94 slices shown]
[im 32/94  soft-tissue]
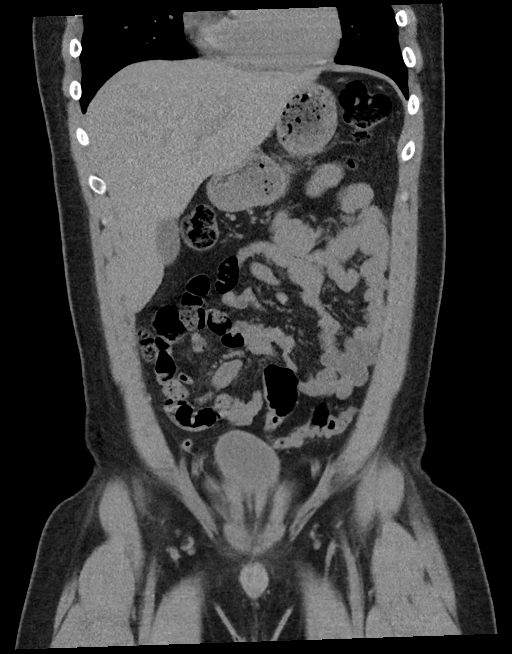
[im 42/94  soft-tissue]
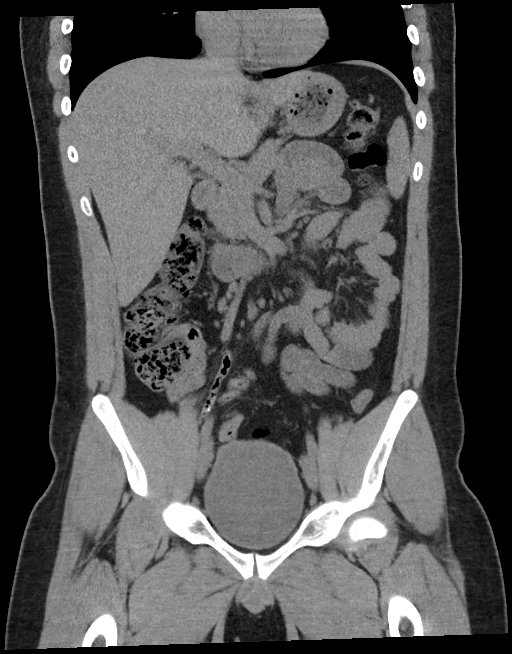
[im 52/94  soft-tissue]
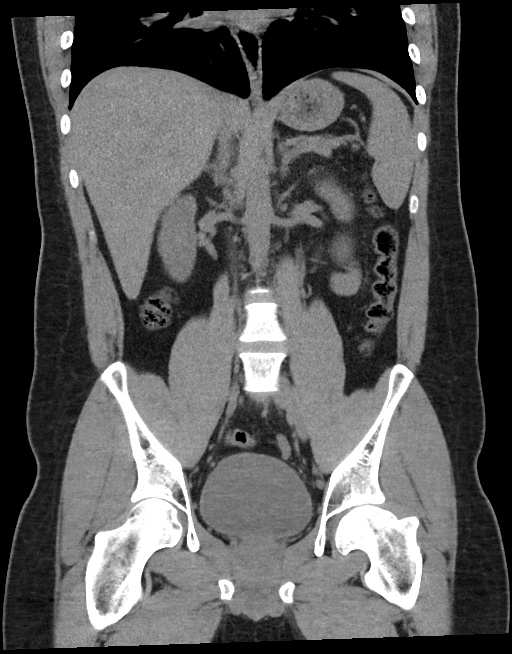

[16 of 46 positions shown; findings below may reference images not displayed]

FINDINGS: Lower chest: No acute abnormality.

Hepatobiliary: No focal liver abnormality is seen. No gallstones,
gallbladder wall thickening, or biliary dilatation.

Pancreas: Unremarkable. No pancreatic ductal dilatation or
surrounding inflammatory changes.

Spleen: Normal in size without focal abnormality.

Adrenals/Urinary Tract: Normal adrenal glands. No kidney stones
identified bilaterally. No hydronephrosis. No ureteral lithiasis.
Urinary bladder appears normal.

Stomach/Bowel: Stomach appears normal. The appendix is visualized
containing a few small appendicular lists. The lumen of the appendix
however is filled with gas and there are no signs of appendiceal
inflammation. No signs of bowel wall thickening, inflammation, or
distension.

Vascular/Lymphatic: No significant vascular findings are present. No
enlarged abdominal or pelvic lymph nodes.

Reproductive: Prostate is unremarkable.

Other: No free fluid or fluid collections.

Musculoskeletal: No acute or significant osseous findings.
IMPRESSION: No acute findings within the abdomen or pelvis. No explanation for
patient's abdominal pain.

## 2020-10-06 MED ORDER — ONDANSETRON 4 MG PO TBDP: 4.0000 mg | ORAL_TABLET | Freq: Once | ORAL | Status: AC | PRN

## 2020-10-06 MED ORDER — ONDANSETRON 4 MG PO TBDP
ORAL_TABLET | ORAL | Status: AC
  Administered 2020-10-06: 4 mg via ORAL
  Filled 2020-10-06: qty 1

## 2020-10-06 NOTE — ED Triage Notes (Signed)
LUQ into back and radiating to R flank pain x2days with nausea, denies diarrhea or vomiting

## 2020-10-07 LAB — BLOOD GAS, VENOUS
Acid-Base Excess: 4.1 mmol/L — ABNORMAL HIGH (ref 0.0–2.0)
Bicarbonate: 28.5 mmol/L — ABNORMAL HIGH (ref 20.0–28.0)
O2 Saturation: 95.7 %
Patient temperature: 37
pCO2, Ven: 41 mmHg — ABNORMAL LOW (ref 44.0–60.0)
pH, Ven: 7.45 — ABNORMAL HIGH (ref 7.250–7.430)
pO2, Ven: 76 mmHg — ABNORMAL HIGH (ref 32.0–45.0)

## 2020-10-07 LAB — URINALYSIS, COMPLETE (UACMP) WITH MICROSCOPIC
Bacteria, UA: NONE SEEN
Bilirubin Urine: NEGATIVE
Glucose, UA: >=500 mg/dL — AB
Hgb urine dipstick: NEGATIVE
Ketones, ur: 5 mg/dL — AB
Leukocytes,Ua: NEGATIVE
Nitrite: NEGATIVE
Protein, ur: NEGATIVE mg/dL
Specific Gravity, Urine: 1.037 — ABNORMAL HIGH (ref 1.005–1.030)
pH: 6 (ref 5.0–8.0)

## 2020-10-07 LAB — CBG MONITORING, ED
Glucose-Capillary: 149 mg/dL — ABNORMAL HIGH (ref 70–99)
Glucose-Capillary: 274 mg/dL — ABNORMAL HIGH (ref 70–99)

## 2020-10-07 LAB — D-DIMER, QUANTITATIVE: D-Dimer, Quant: <0.27 ug/mL-FEU (ref 0.00–0.50)

## 2020-10-07 MED ORDER — LACTATED RINGERS IV BOLUS
1000.0000 mL | Freq: Once | INTRAVENOUS | Status: AC
  Administered 2020-10-07: 1000 mL via INTRAVENOUS

## 2020-10-07 MED ORDER — FAMOTIDINE 20 MG PO TABS: 20.0000 mg | ORAL_TABLET | Freq: Two times a day (BID) | ORAL | 1 refills | Status: DC

## 2020-10-07 MED ORDER — METOCLOPRAMIDE HCL 10 MG PO TABS: 10.0000 mg | ORAL_TABLET | Freq: Three times a day (TID) | ORAL | 0 refills | Status: DC | PRN

## 2020-10-07 MED ORDER — ALUM & MAG HYDROXIDE-SIMETH 200-200-20 MG/5ML PO SUSP
30.0000 mL | Freq: Once | ORAL | Status: AC
  Administered 2020-10-07: 30 mL via ORAL
  Filled 2020-10-07: qty 30

## 2020-10-07 MED ORDER — LIDOCAINE VISCOUS HCL 2 % MT SOLN
15.0000 mL | Freq: Once | OROMUCOSAL | Status: AC
  Administered 2020-10-07: 15 mL via ORAL
  Filled 2020-10-07: qty 15

## 2020-10-07 MED ORDER — INSULIN ASPART 100 UNIT/ML IJ SOLN
7.0000 [IU] | Freq: Once | INTRAMUSCULAR | Status: AC
  Administered 2020-10-07: 7 [IU] via INTRAVENOUS
  Filled 2020-10-07: qty 1

## 2020-10-07 MED ORDER — FAMOTIDINE IN NACL 20-0.9 MG/50ML-% IV SOLN
20.0000 mg | Freq: Once | INTRAVENOUS | Status: AC
  Administered 2020-10-07: 20 mg via INTRAVENOUS
  Filled 2020-10-07: qty 50

## 2020-10-07 MED ORDER — METOCLOPRAMIDE HCL 5 MG/ML IJ SOLN
10.0000 mg | Freq: Once | INTRAMUSCULAR | Status: AC
  Administered 2020-10-07: 10 mg via INTRAVENOUS

## 2020-10-07 MED ORDER — ONDANSETRON HCL 4 MG/2ML IJ SOLN
4.0000 mg | Freq: Once | INTRAMUSCULAR | Status: DC
  Filled 2020-10-07: qty 2

## 2020-10-07 NOTE — Discharge Instructions (Addendum)

## 2020-10-07 NOTE — ED Provider Notes (Signed)
[facility] Emergency Department Provider Note  ____________________________________________  Time seen: Approximately 3:26 AM  I have reviewed the triage vital signs and the nursing notes.   HISTORY  Chief Complaint Abdominal Pain   HPI Thomas Maddox. is a 20 y.o. male with a history of type 1 diabetes, intractable nausea and vomiting, ADHD, AKI who presents for evaluation of abdominal pain and nausea.  Patient reports 2 days of left upper abdominal pain that he describes as intermittent and stabbing which radiates across the left flank and the right flank.  The pain is currently 5 out of 10.  He reports that the pain is becoming more pronounced.  He denies shortness of breath although the pain is worse with deep inspiration.  He complains of nausea but no vomiting, no fever, no cough, no chest pain.  No personal or family history of blood clots, no recent travel immobilization, no leg pain or swelling, no hemoptysis or exogenous hormones.  Patient denies any diarrhea or constipation.  Denies dysuria or hematuria.  Past Medical History:  Diagnosis Date   ADHD (attention deficit hyperactivity disorder)    Diabetes mellitus without complication [facility])     Patient Active Problem List   Diagnosis Date Noted   Intractable vomiting with nausea 07/04/2019   ADHD 07/04/2019   Tachycardia 07/04/2019   Type 1 diabetes mellitus without complication ([place]) 57/84/6962   AKI (acute kidney injury) ([place]) 02/09/2016   DKA, type 1 ([place]) 03/27/2015   DKA (diabetic ketoacidoses) 03/17/2015    Past Surgical History:  Procedure Laterality Date   NO PAST SURGERIES      Prior to Admission medications   Medication Sig Start Date End Date Taking? Authorizing Provider  famotidine (PEPCID) 20 MG tablet Take 1 tablet (20 mg total) by mouth 2 (two) times daily. 10/07/20 10/07/21 Yes , [place], MD  metoCLOPramide (REGLAN) 10 MG tablet Take 1 tablet (10 mg total) by  mouth every 8 (eight) hours as needed for up to 3 days for nausea. 10/07/20 10/10/20 Yes , [place], MD  amphetamine-dextroamphetamine (ADDERALL XR) 10 MG 24 hr capsule Take 10 mg by mouth every morning.     [provider]  amphetamine-dextroamphetamine (ADDERALL) 10 MG tablet Take 10 mg by mouth daily at 2 PM.     [provider]  blood glucose meter kit and supplies KIT Dispense based on patient and insurance preference. Use up to four times daily as directed. (FOR ICD-9 250.00, 250.01). 07/04/19   Elodia Florence., MD  insulin aspart (NOVOLOG) 100 UNIT/ML FlexPen Inject 8 Units into the skin 3 (three) times daily with meals. 07/04/19   Elodia Florence., MD  Insulin Pen Needle 32G X 4 MM MISC Use with insulin 07/04/19   Elodia Florence., MD    Allergies Patient has no known allergies.  Family History  Problem Relation Age of Onset   Hypertension Other     Social History Social History   Tobacco Use   Smoking status: Never   Smokeless tobacco: Never  Vaping Use   Vaping Use: Never used  Substance Use Topics   Alcohol use: No   Drug use: No    Review of Systems   Constitutional: Negative for fever. Eyes: Negative for visual changes. ENT: Negative for sore throat. Neck: No neck pain  Cardiovascular: Negative for chest pain. Respiratory: Negative for shortness of breath. Gastrointestinal: + LUQ abdominal pain and nausea. No vomiting or diarrhea. Genitourinary: Negative for  dysuria. Musculoskeletal: Negative for back pain. Skin: Negative for rash. Neurological: Negative for headaches, weakness or numbness. Psych: No SI or HI  ____________________________________________   PHYSICAL EXAM:  VITAL SIGNS: Vitals:   10/06/20 2027 10/07/20 0156  BP: (!) 142/84   Pulse: (!) 111 90  Resp: 16 18  Temp: 99 F (37.2 C)   SpO2: 98%      Constitutional: Alert and oriented. Well appearing and in no apparent distress. HEENT:       Head: Normocephalic and atraumatic.         Eyes: Conjunctivae are normal. Sclera is non-icteric.       Mouth/Throat: Mucous membranes are moist.       Neck: Supple with no signs of meningismus. Cardiovascular: Regular rate and rhythm. No murmurs, gallops, or rubs. 2+ symmetrical distal pulses are present in all extremities. No JVD. Respiratory: Normal respiratory effort. Lungs are clear to auscultation bilaterally.  Gastrointestinal: Soft, non tender, and non distended with positive bowel sounds. No rebound or guarding. Genitourinary: No CVA tenderness. Musculoskeletal:  No edema, cyanosis, or erythema of extremities. Neurologic: Normal speech and language. Face is symmetric. Moving all extremities. No gross focal neurologic deficits are appreciated. Skin: Skin is warm, dry and intact. No rash noted. Psychiatric: Mood and affect are normal. Speech and behavior are normal.  ____________________________________________   LABS (all labs ordered are listed, but only abnormal results are displayed)  Labs Reviewed  COMPREHENSIVE METABOLIC PANEL - Abnormal; Notable for the following components:      Result Value   Sodium 133 (*)    Glucose, Bld 457 (*)    All other components within normal limits  URINALYSIS, COMPLETE (UACMP) WITH MICROSCOPIC - Abnormal; Notable for the following components:   Color, Urine YELLOW (*)    APPearance CLEAR (*)    Specific Gravity, Urine 1.037 (*)    Glucose, UA >=500 (*)    Ketones, ur 5 (*)    All other components within normal limits  BLOOD GAS, VENOUS - Abnormal; Notable for the following components:   pH, Ven 7.45 (*)    pCO2, Ven 41 (*)    pO2, Ven 76.0 (*)    Bicarbonate 28.5 (*)    Acid-Base Excess 4.1 (*)    All other components within normal limits  CBG MONITORING, ED - Abnormal; Notable for the following components:   Glucose-Capillary 274 (*)    All other components within normal limits  CBG MONITORING, ED - Abnormal; Notable for the  following components:   Glucose-Capillary 149 (*)    All other components within normal limits  LIPASE, BLOOD  CBC  D-DIMER, QUANTITATIVE   ____________________________________________  EKG  none  ____________________________________________  RADIOLOGY  I have personally reviewed the images performed during this visit and I agree with the Radiologist's read.   Interpretation by Radiologist:  CT ABDOMEN PELVIS WO CONTRAST  Result Date: 10/06/2020 CLINICAL DATA:  Abdominal pain. EXAM: CT ABDOMEN AND PELVIS WITHOUT CONTRAST TECHNIQUE: Multidetector CT imaging of the abdomen and pelvis was performed following the standard protocol without IV contrast. COMPARISON:  None. FINDINGS: Lower chest: No acute abnormality. Hepatobiliary: No focal liver abnormality is seen. No gallstones, gallbladder wall thickening, or biliary dilatation. Pancreas: Unremarkable. No pancreatic ductal dilatation or surrounding inflammatory changes. Spleen: Normal in size without focal abnormality. Adrenals/Urinary Tract: Normal adrenal glands. No kidney stones identified bilaterally. No hydronephrosis. No ureteral lithiasis. Urinary bladder appears normal. Stomach/Bowel: Stomach appears normal. The appendix is visualized containing a few small appendicular  lists. The lumen of the appendix however is filled with gas and there are no signs of appendiceal inflammation. No signs of bowel wall thickening, inflammation, or distension. Vascular/Lymphatic: No significant vascular findings are present. No enlarged abdominal or pelvic lymph nodes. Reproductive: Prostate is unremarkable. Other: No free fluid or fluid collections. Musculoskeletal: No acute or significant osseous findings. IMPRESSION: No acute findings within the abdomen or pelvis. No explanation for patient's abdominal pain. Electronically Signed   By: Kerby Moors M.D.   On: 10/06/2020 23:41      ____________________________________________   PROCEDURES  Procedure(s) performed: None Procedures Critical Care performed:  None ____________________________________________   INITIAL IMPRESSION / ASSESSMENT AND PLAN / ED COURSE  19 y.o. male with a history of type 1 diabetes, intractable nausea and vomiting, ADHD, AKI who presents for evaluation of LUQ abdominal pain radiating to bilateral flanks and associated with nausea for 2 days.  Patient is well-appearing and in no distress, abdomen is soft with no significant tenderness to palpation.  He does report that the pain is intermittent.  Initially patient was tachycardic but that resolved after IV fluids.  Differential diagnosis including gastritis versus peptic ulcer disease versus gallbladder pathology versus pancreatitis versus gastroparesis versus pyelonephritis versus kidney stone.  PE thought to be less likely however since patient reports that the pain is worse with deep inspiration he was tachycardic on arrival D-dimer was sent which is negative.  Labs showing elevated glucose with no signs of DKA with a normal pH and no anion gap.  UA with no evidence of UTI or blood.  Normal lipase and LFTs.  No significant electrolyte derangements.  No leukocytosis.  CT abdomen pelvis with no acute pathology.  After receiving IV fluids, IV insulin, IV Reglan, IV Pepcid, and a GI cocktail patient feels markedly improved.  No longer having any pain.  Repeat blood glucose of 149.  Patient is tolerating p.o. with no episodes of nausea or pain.  Will discharge home with Zofran and Pepcid, follow-up with PCP.  Discussed my standard return precautions      _____________________________________________ Please note:  Patient was evaluated in Emergency Department today for the symptoms described in the history of present illness. Patient was evaluated in the context of the global COVID-19 pandemic, which necessitated consideration that the patient might  be at risk for infection with the SARS-CoV-2 virus that causes COVID-19. Institutional protocols and algorithms that pertain to the evaluation of patients at risk for COVID-19 are in a state of rapid change based on information released by regulatory bodies including the CDC and federal and state organizations. These policies and algorithms were followed during the patient's care in the ED.  Some ED evaluations and interventions may be delayed as a result of limited staffing during the pandemic.   [place] Controlled Substance Database was reviewed by me. ____________________________________________   FINAL CLINICAL IMPRESSION(S) / ED DIAGNOSES   Final diagnoses:  Left upper quadrant abdominal pain  Hyperglycemia  Acute gastritis without hemorrhage, unspecified gastritis type      NEW MEDICATIONS STARTED DURING THIS VISIT:  ED Discharge Orders          Ordered    metoCLOPramide (REGLAN) 10 MG tablet  Every 8 hours PRN        10/07/20 0331    famotidine (PEPCID) 20 MG tablet  2 times daily        10/07/20 0331             Note:  This document  was prepared using Systems analyst and may include unintentional dictation errors.    Rudene Re, MD 10/07/20 [phone]

## 2020-10-07 NOTE — ED Notes (Signed)
Pt states is feeling improved and would like to go home.

## 2020-10-21 ENCOUNTER — Emergency Department
Admission: EM | Admit: 2020-10-21 | Discharge: 2020-10-21 | Disposition: A | Discharge status: Home / Self Care | Payer: Medicaid Other | Attending: Emergency Medicine | Admitting: Emergency Medicine

## 2020-10-21 ENCOUNTER — Emergency Department: Payer: Medicaid Other

## 2020-10-21 DIAGNOSIS — R079 Chest pain, unspecified: Secondary | ICD-10-CM | POA: Diagnosis not present

## 2020-10-21 DIAGNOSIS — Z794 Long term (current) use of insulin: Secondary | ICD-10-CM | POA: Insufficient documentation

## 2020-10-21 DIAGNOSIS — E1065 Type 1 diabetes mellitus with hyperglycemia: Secondary | ICD-10-CM | POA: Insufficient documentation

## 2020-10-21 DIAGNOSIS — R0602 Shortness of breath: Secondary | ICD-10-CM | POA: Diagnosis present

## 2020-10-21 DIAGNOSIS — R739 Hyperglycemia, unspecified: Secondary | ICD-10-CM

## 2020-10-21 LAB — CBC WITH DIFFERENTIAL/PLATELET
Abs Immature Granulocytes: 0.05 10*3/uL (ref 0.00–0.07)
Basophils Absolute: 0.1 10*3/uL (ref 0.0–0.1)
Basophils Relative: 1 %
Eosinophils Absolute: 0.1 10*3/uL (ref 0.0–0.5)
Eosinophils Relative: 1 %
HCT: 44.7 % (ref 39.0–52.0)
Hemoglobin: 15.3 g/dL (ref 13.0–17.0)
Immature Granulocytes: 0 %
Lymphocytes Relative: 26 %
Lymphs Abs: 3.3 10*3/uL (ref 0.7–4.0)
MCH: 28.8 pg (ref 26.0–34.0)
MCHC: 34.2 g/dL (ref 30.0–36.0)
MCV: 84 fL (ref 80.0–100.0)
Monocytes Absolute: 1.5 10*3/uL — ABNORMAL HIGH (ref 0.1–1.0)
Monocytes Relative: 12 %
Neutro Abs: 7.6 10*3/uL (ref 1.7–7.7)
Neutrophils Relative %: 60 %
Platelets: 290 10*3/uL (ref 150–400)
RBC: 5.32 MIL/uL (ref 4.22–5.81)
RDW: 13 % (ref 11.5–15.5)
Smear Review: NORMAL
WBC: 12.7 10*3/uL — ABNORMAL HIGH (ref 4.0–10.5)
nRBC: 0 % (ref 0.0–0.2)

## 2020-10-21 LAB — HEPATIC FUNCTION PANEL
ALT: 17 U/L (ref 0–44)
AST: 19 U/L (ref 15–41)
Albumin: 3.9 g/dL (ref 3.5–5.0)
Alkaline Phosphatase: 79 U/L (ref 38–126)
Bilirubin, Direct: <0.1 mg/dL (ref 0.0–0.2)
Total Bilirubin: 0.8 mg/dL (ref 0.3–1.2)
Total Protein: 7.4 g/dL (ref 6.5–8.1)

## 2020-10-21 LAB — T4, FREE: Free T4: 1.11 ng/dL (ref 0.61–1.12)

## 2020-10-21 LAB — BASIC METABOLIC PANEL
Anion gap: 13 (ref 5–15)
BUN: 16 mg/dL (ref 6–20)
CO2: 20 mmol/L — ABNORMAL LOW (ref 22–32)
Calcium: 8.9 mg/dL (ref 8.9–10.3)
Chloride: 103 mmol/L (ref 98–111)
Creatinine, Ser: 0.88 mg/dL (ref 0.61–1.24)
GFR, Estimated: >60 mL/min (ref >60)
Glucose, Bld: 339 mg/dL — ABNORMAL HIGH (ref 70–99)
Potassium: 4.3 mmol/L (ref 3.5–5.1)
Sodium: 136 mmol/L (ref 135–145)

## 2020-10-21 LAB — LIPASE, BLOOD: Lipase: 19 U/L (ref 11–51)

## 2020-10-21 LAB — CK: Total CK: 65 U/L (ref 49–397)

## 2020-10-21 LAB — TSH: TSH: 4.73 u[IU]/mL — ABNORMAL HIGH (ref 0.350–4.500)

## 2020-10-21 LAB — D-DIMER, QUANTITATIVE: D-Dimer, Quant: 0.57 ug/mL-FEU — ABNORMAL HIGH (ref 0.00–0.50)

## 2020-10-21 LAB — TROPONIN I (HIGH SENSITIVITY)
Troponin I (High Sensitivity): 3 ng/L (ref <18)
Troponin I (High Sensitivity): 4 ng/L (ref <18)

## 2020-10-21 IMAGING — CT CT TEMPORAL BONES W/ CM
1 of 5 series · 14 of 35 positions shown, 18 images · IV contrast (APPLIED)
Comparison: No prior head imaging.

CLINICAL DATA: 20-year-old male with mass behind the left ear.
Tender to palpation. Diabetes.

EXAM:
CT TEMPORAL BONES WITH CONTRAST
TECHNIQUE: Axial and coronal plane CT imaging of the petrous temporal bones was
performed with thin-collimation image reconstruction following
intravenous contrast administration. Multiplanar CT image
reconstructions were also generated.
CONTRAST:  75mL OMNIPAQUE IOHEXOL 350 MG/ML SOLN

[Series 5: ax soft · axial · 0.33mm/px · z∈[-165,-75]mm · 14 of 49 slices shown, 18 images]
[im 2/49  mediastinal]
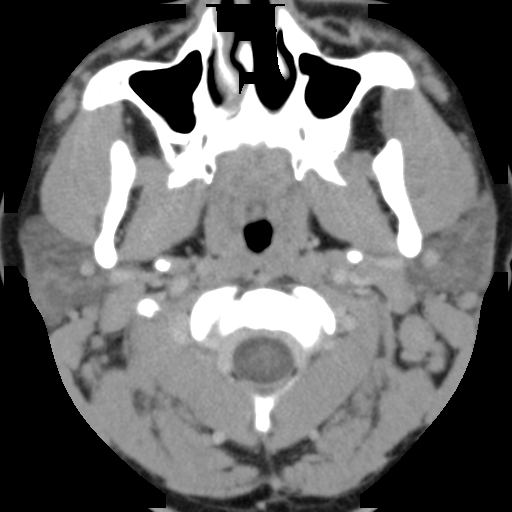
[im 2/49  bone]
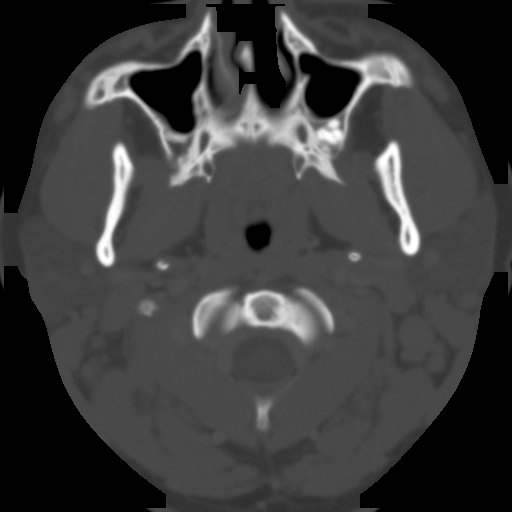
[im 6/49  bone]
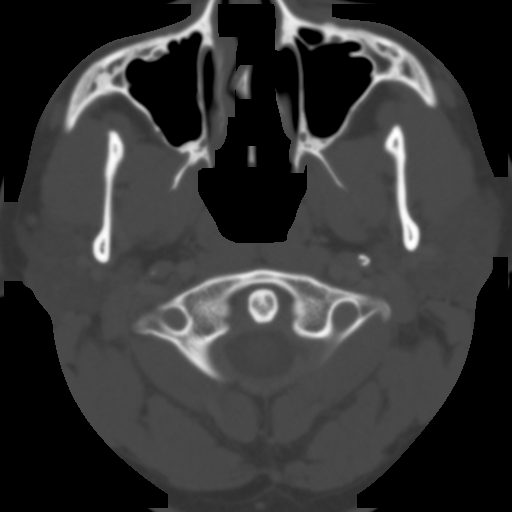
[im 9/49  bone]
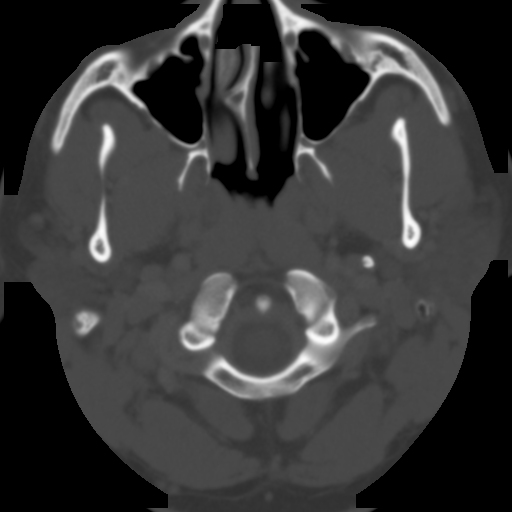
[im 13/49  bone]
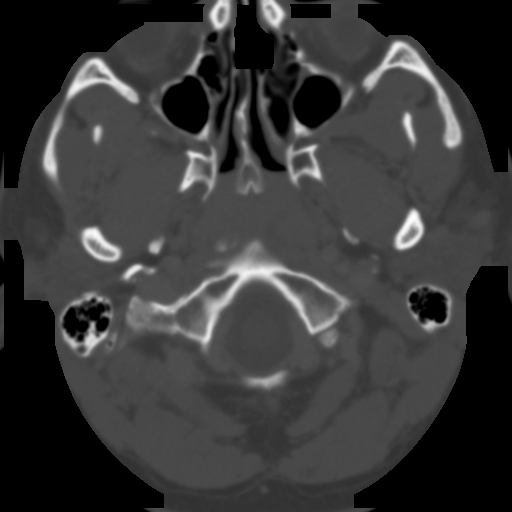
[im 17/49  mediastinal]
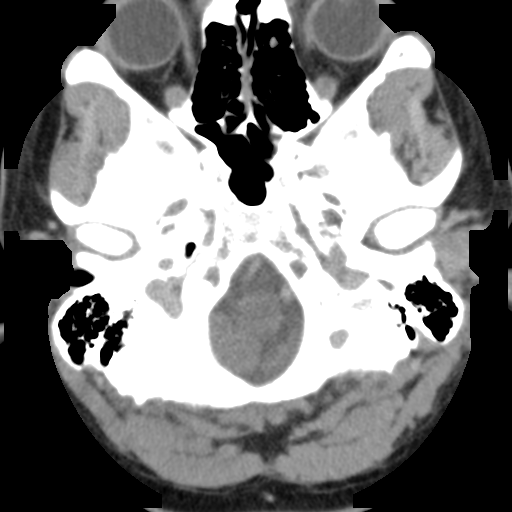
[im 17/49  bone]
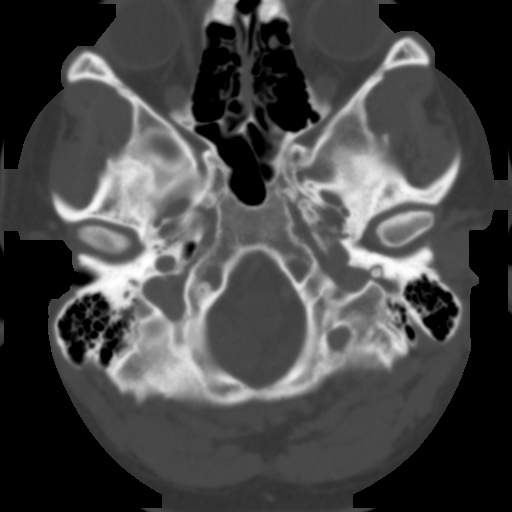
[im 20/49  bone]
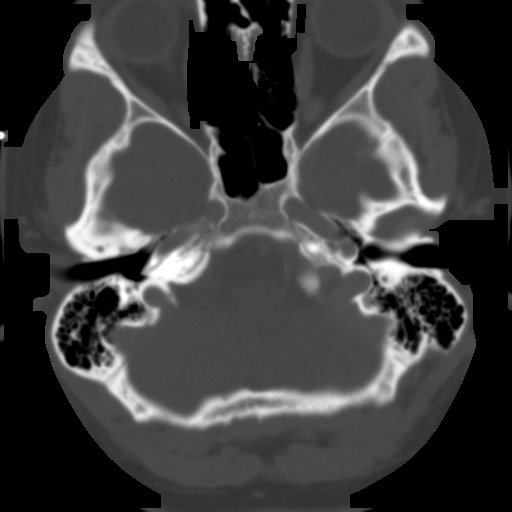
[im 23/49  bone]
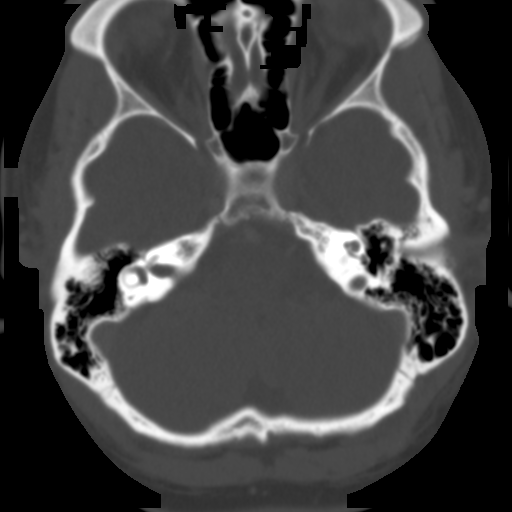
[im 25/49  bone]
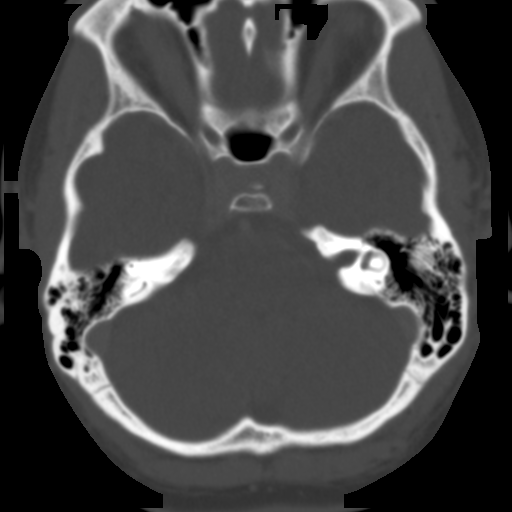
[im 29/49  mediastinal]
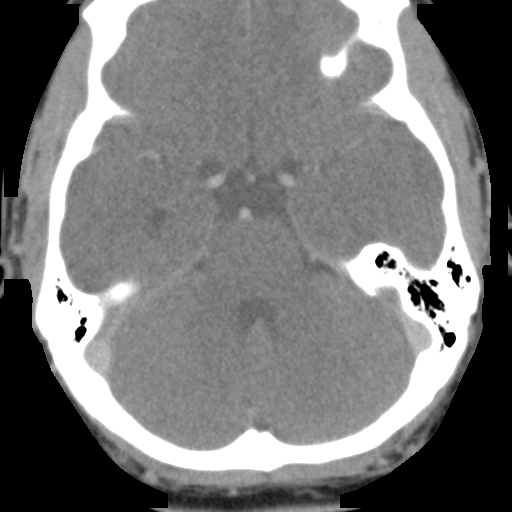
[im 29/49  bone]
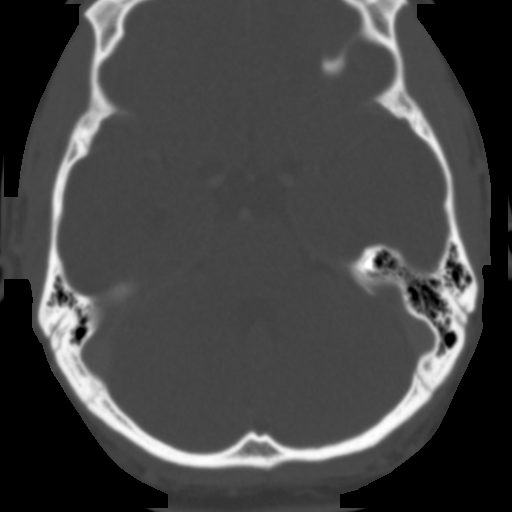
[im 33/49  bone]
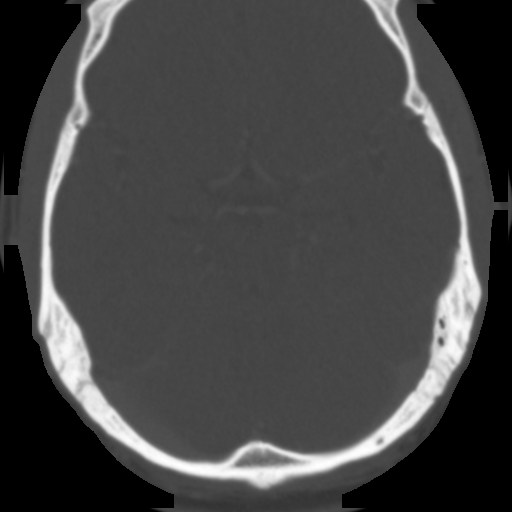
[im 36/49  bone]
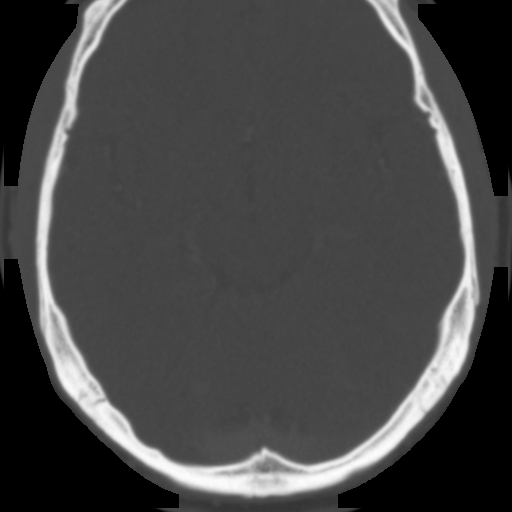
[im 40/49  bone]
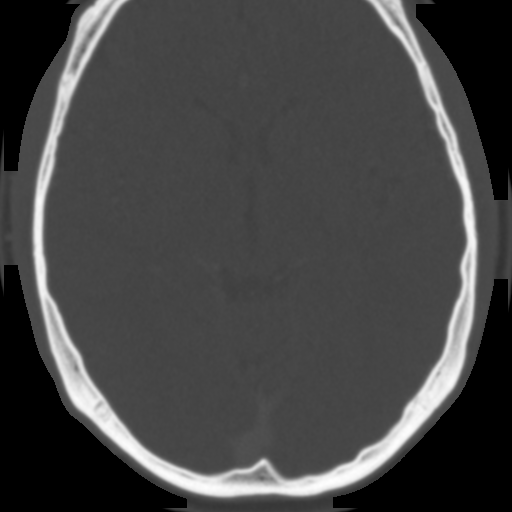
[im 43/49  mediastinal]
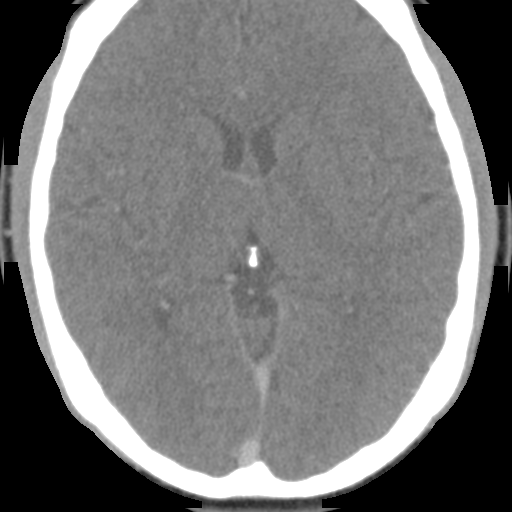
[im 43/49  bone]
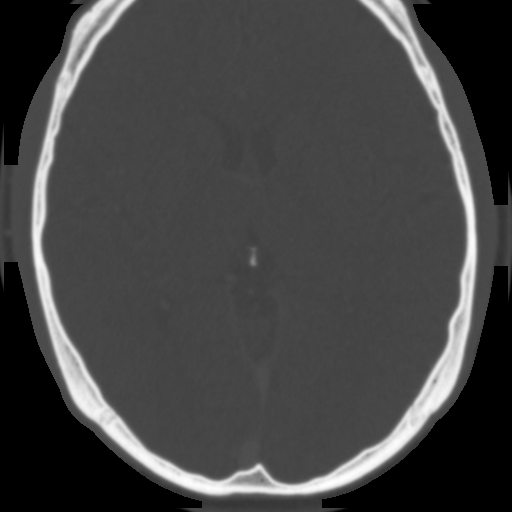
[im 47/49  bone]
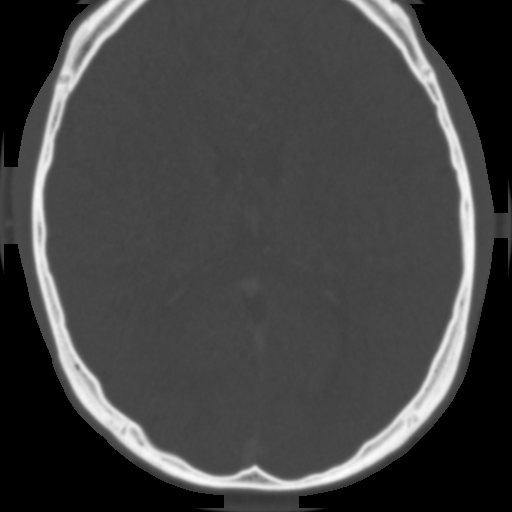

[14 of 35 positions shown; findings below may reference images not displayed]

FINDINGS: Negative visualized brain parenchyma. The major vascular structures
at the skull base are enhancing and appear to be patent, including
both sigmoid sinuses and IJ bulbs.

Negative visible nasopharynx, parapharyngeal spaces, orbits, parotid
and masticator spaces.

Right periauricular soft tissues appear normal.

On the left there are several small but asymmetric and mildly
indistinct appearing left level 5 lymph nodes posterior to the pinna
measuring 7-8 mm (series 5, images 27 and 20). But the other
regional soft tissues appear normal. No enlargement of the pinna is
evident. Right periauricular soft tissues appear normal.

Visualized paranasal sinuses are well aerated. Bone mineralization
at the skull base appears normal.

LEFT TEMPORAL BONE:

Normal left external auditory canal. Normal left tympanic membrane.
The left tympanic cavity is clear. Intact scutum and ossicles.
Normal ossicle alignment.

Left mastoid antrum is clear. Left mastoid air cells are clear and
intact.

Mild high riding left IJ bulb with intact overlying bony covering
(series 9, image 75).

Left IAC, cochlea, vestibule and vestibular aqueduct are within
normal limits. Normal left semicircular canals and course of the
left 7th nerve.

RIGHT TEMPORAL BONE:

Normal right EAC. Normal right tympanic membrane. Normal right
tympanic cavity, ossicles and scutum. Right mastoid antrum and air
cells are clear and intact.

Right IAC, cochlea, vestibule and vestibular aqueduct are within
normal limits.

Mildly high riding right IJ bulb similar to the right side with
intact overlying bony covering (series 7, image 70). Right
semicircular canals and course of the right 7th nerve are normal.
IMPRESSION: 1. Normal CT appearance of the bilateral temporal bones.
2. Mildly enlarged and reactive appearing left level 5 lymph nodes
posterior to the pinna. But other periauricular soft tissues appear
normal.

## 2020-10-21 IMAGING — CT CT ANGIO CHEST
2 of 7 series · 18 of 46 positions shown · IV contrast (APPLIED)
Comparison: Current chest radiograph.

CLINICAL DATA: Acute onset chest pain beginning while patient was
asleep this morning.

EXAM:
CT ANGIOGRAPHY CHEST WITH CONTRAST
TECHNIQUE: Multidetector CT imaging of the chest was performed using the
standard protocol during bolus administration of intravenous
contrast. Multiplanar CT image reconstructions and MIPs were
obtained to evaluate the vascular anatomy.
CONTRAST:  75mL OMNIPAQUE IOHEXOL 350 MG/ML SOLN

[Series 5: thins · axial · 0.64mm/px · z∈[-485,-250]mm · 15 of 328 slices shown]
[im 17/328  lung]
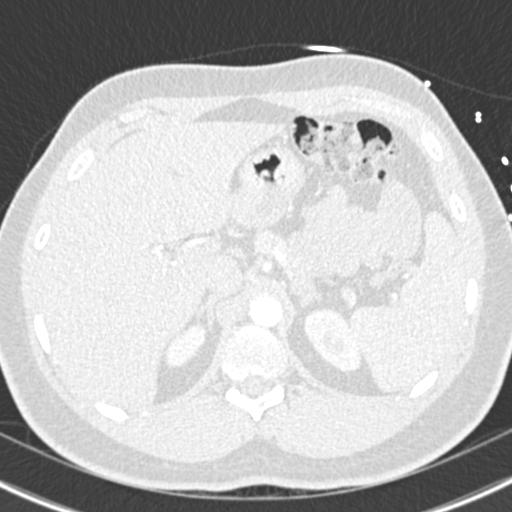
[im 33/328  soft-tissue]
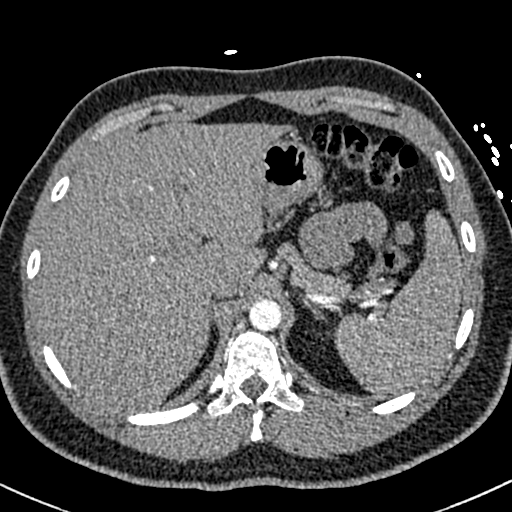
[im 66/328  lung]
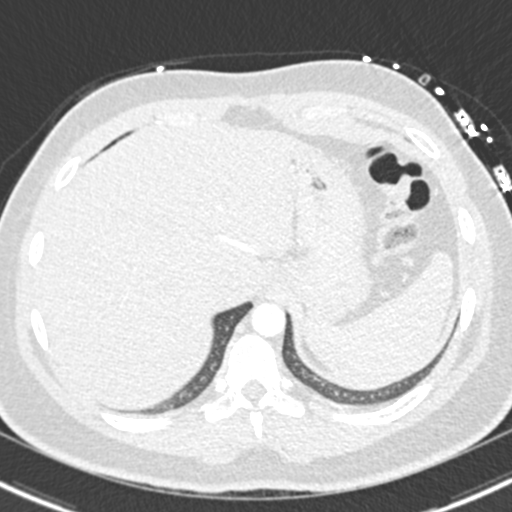
[im 82/328  soft-tissue]
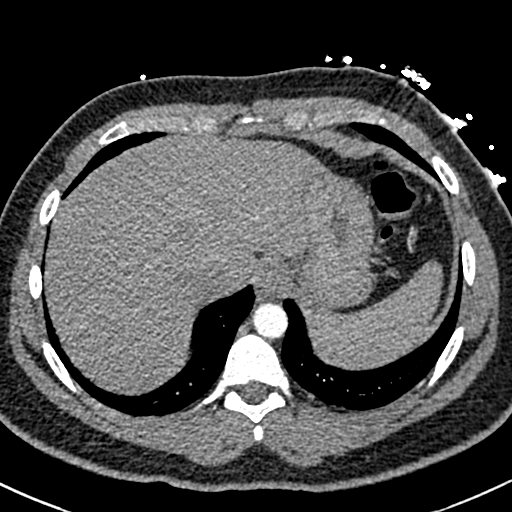
[im 99/328  lung]
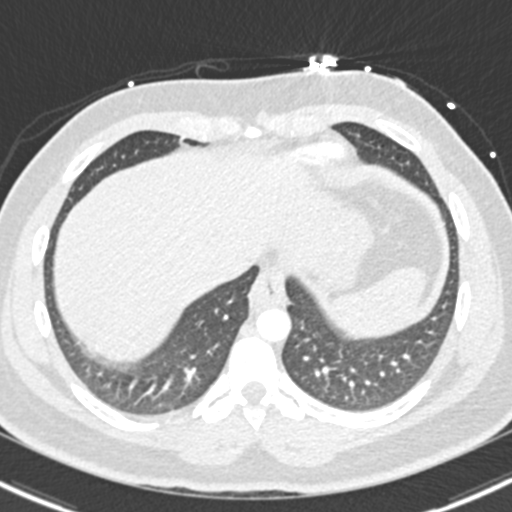
[im 115/328  soft-tissue]
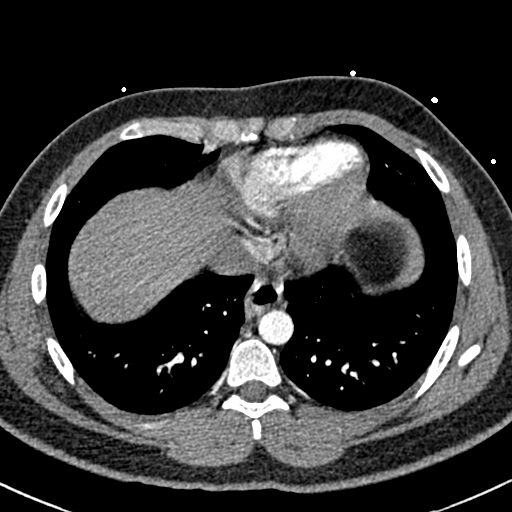
[im 148/328  lung]
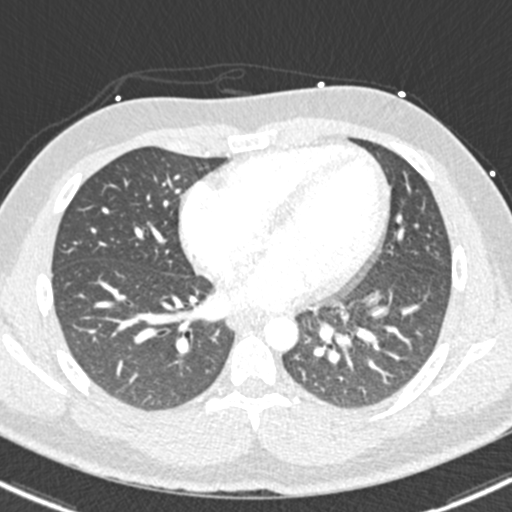
[im 164/328  soft-tissue]
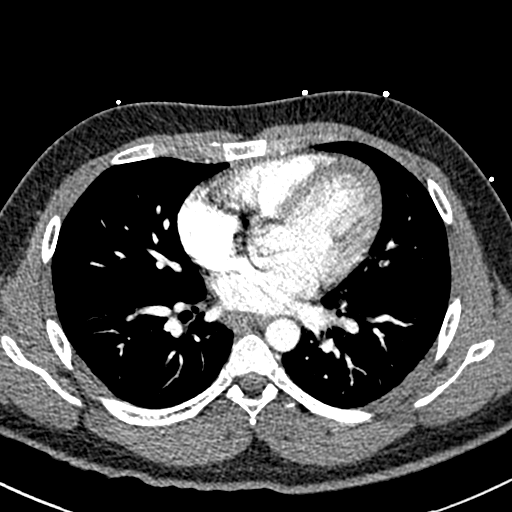
[im 180/328  lung]
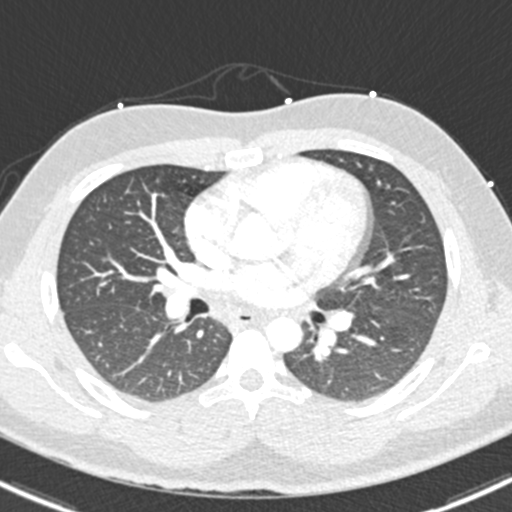
[im 213/328  soft-tissue]
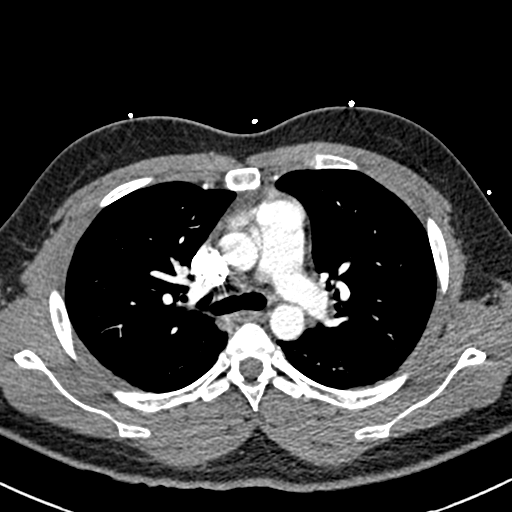
[im 229/328  lung]
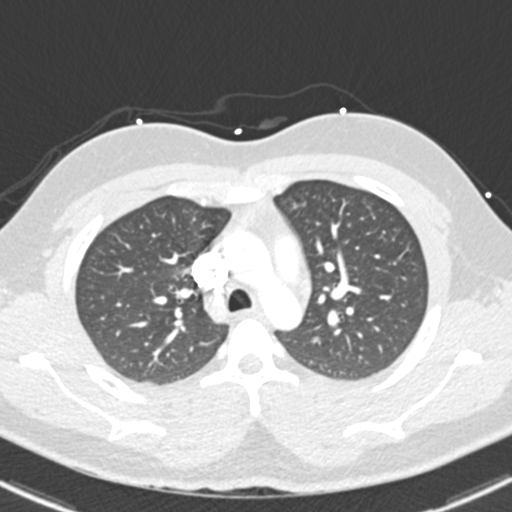
[im 246/328  soft-tissue]
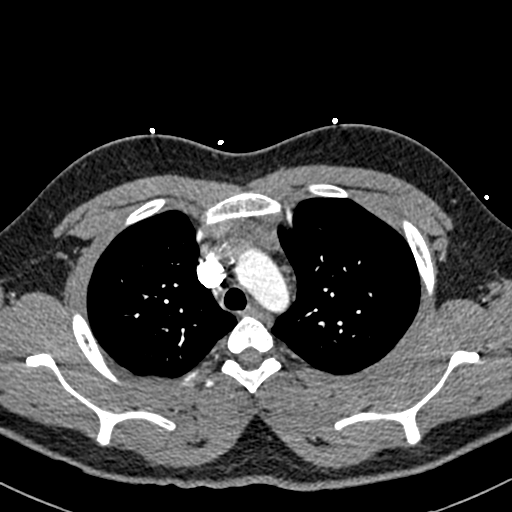
[im 262/328  lung]
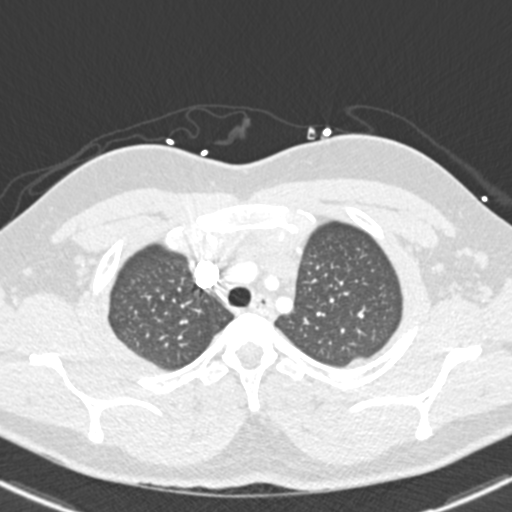
[im 295/328  soft-tissue]
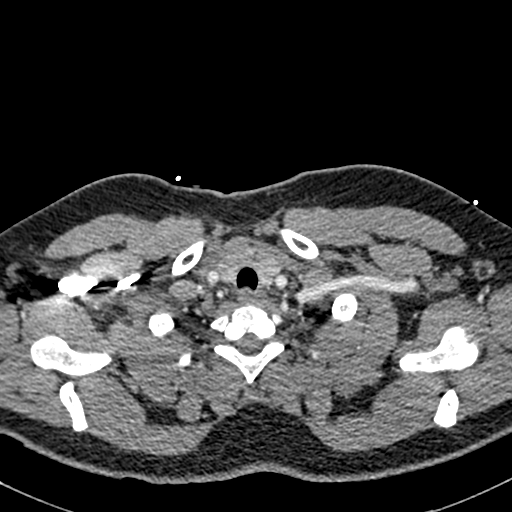
[im 311/328  lung]
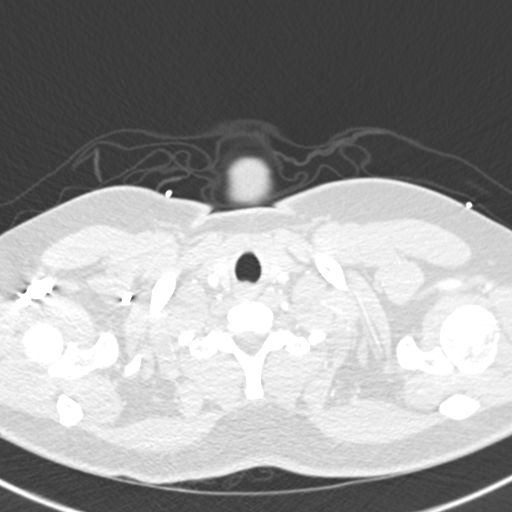

[Series 7: coronal mpr · coronal · 0.52mm/px · 3 of 78 slices shown]
[im 20/78  soft-tissue]
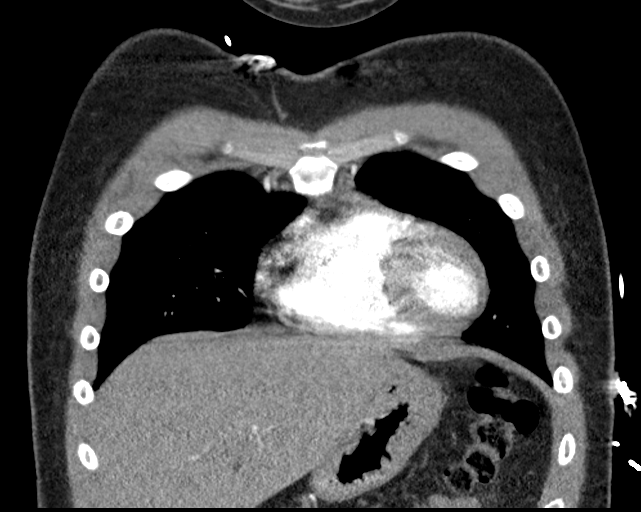
[im 39/78  soft-tissue]
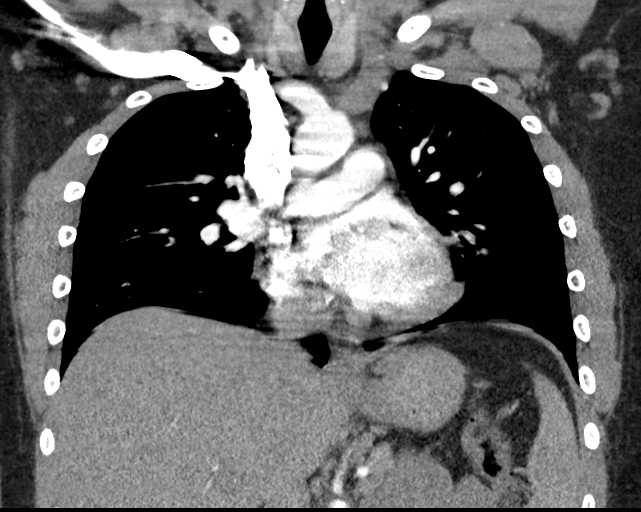
[im 58/78  soft-tissue]
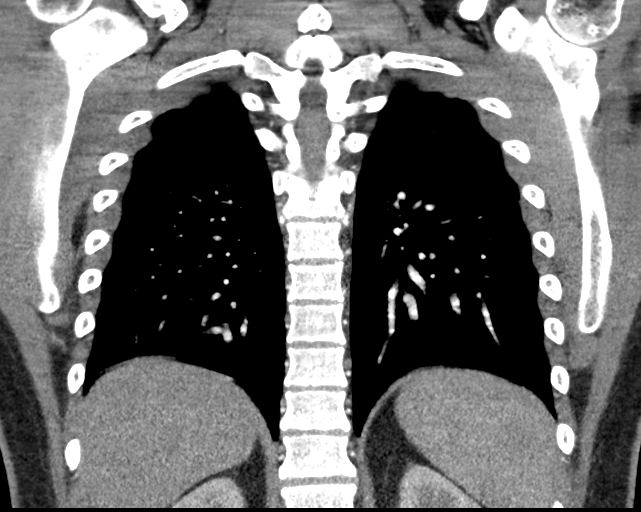

[18 of 46 positions shown; findings below may reference images not displayed]

FINDINGS: Cardiovascular: Pulmonary arteries are not ideally opacified, but
are well enough opacified to assess for pulmonary emboli to the
proximal segmental level. There is no evidence of a pulmonary
embolism.

Heart is normal in size and configuration. No pericardial effusion.
Normal great vessels.

Mediastinum/Nodes: No enlarged mediastinal, hilar, or axillary lymph
nodes. Thyroid gland and trachea demonstrate no significant
findings. Distal esophagus mildly distended, otherwise unremarkable.

Lungs/Pleura: Lungs are clear. No pleural effusion or pneumothorax.

Upper Abdomen: Unremarkable.

Musculoskeletal: No fracture or bone lesion.  No chest wall masses.

Review of the MIP images confirms the above findings.
IMPRESSION: 1. Negative CTA of the chest. No evidence of a pulmonary embolism.
No acute finding.

## 2020-10-21 IMAGING — CR DG CHEST 2V
2 series · 2 of 2 positions shown · non-contrast
Comparison: [DATE]

CLINICAL DATA: Chest pain this morning.

EXAM:
CHEST - 2 VIEW

[chest pa]
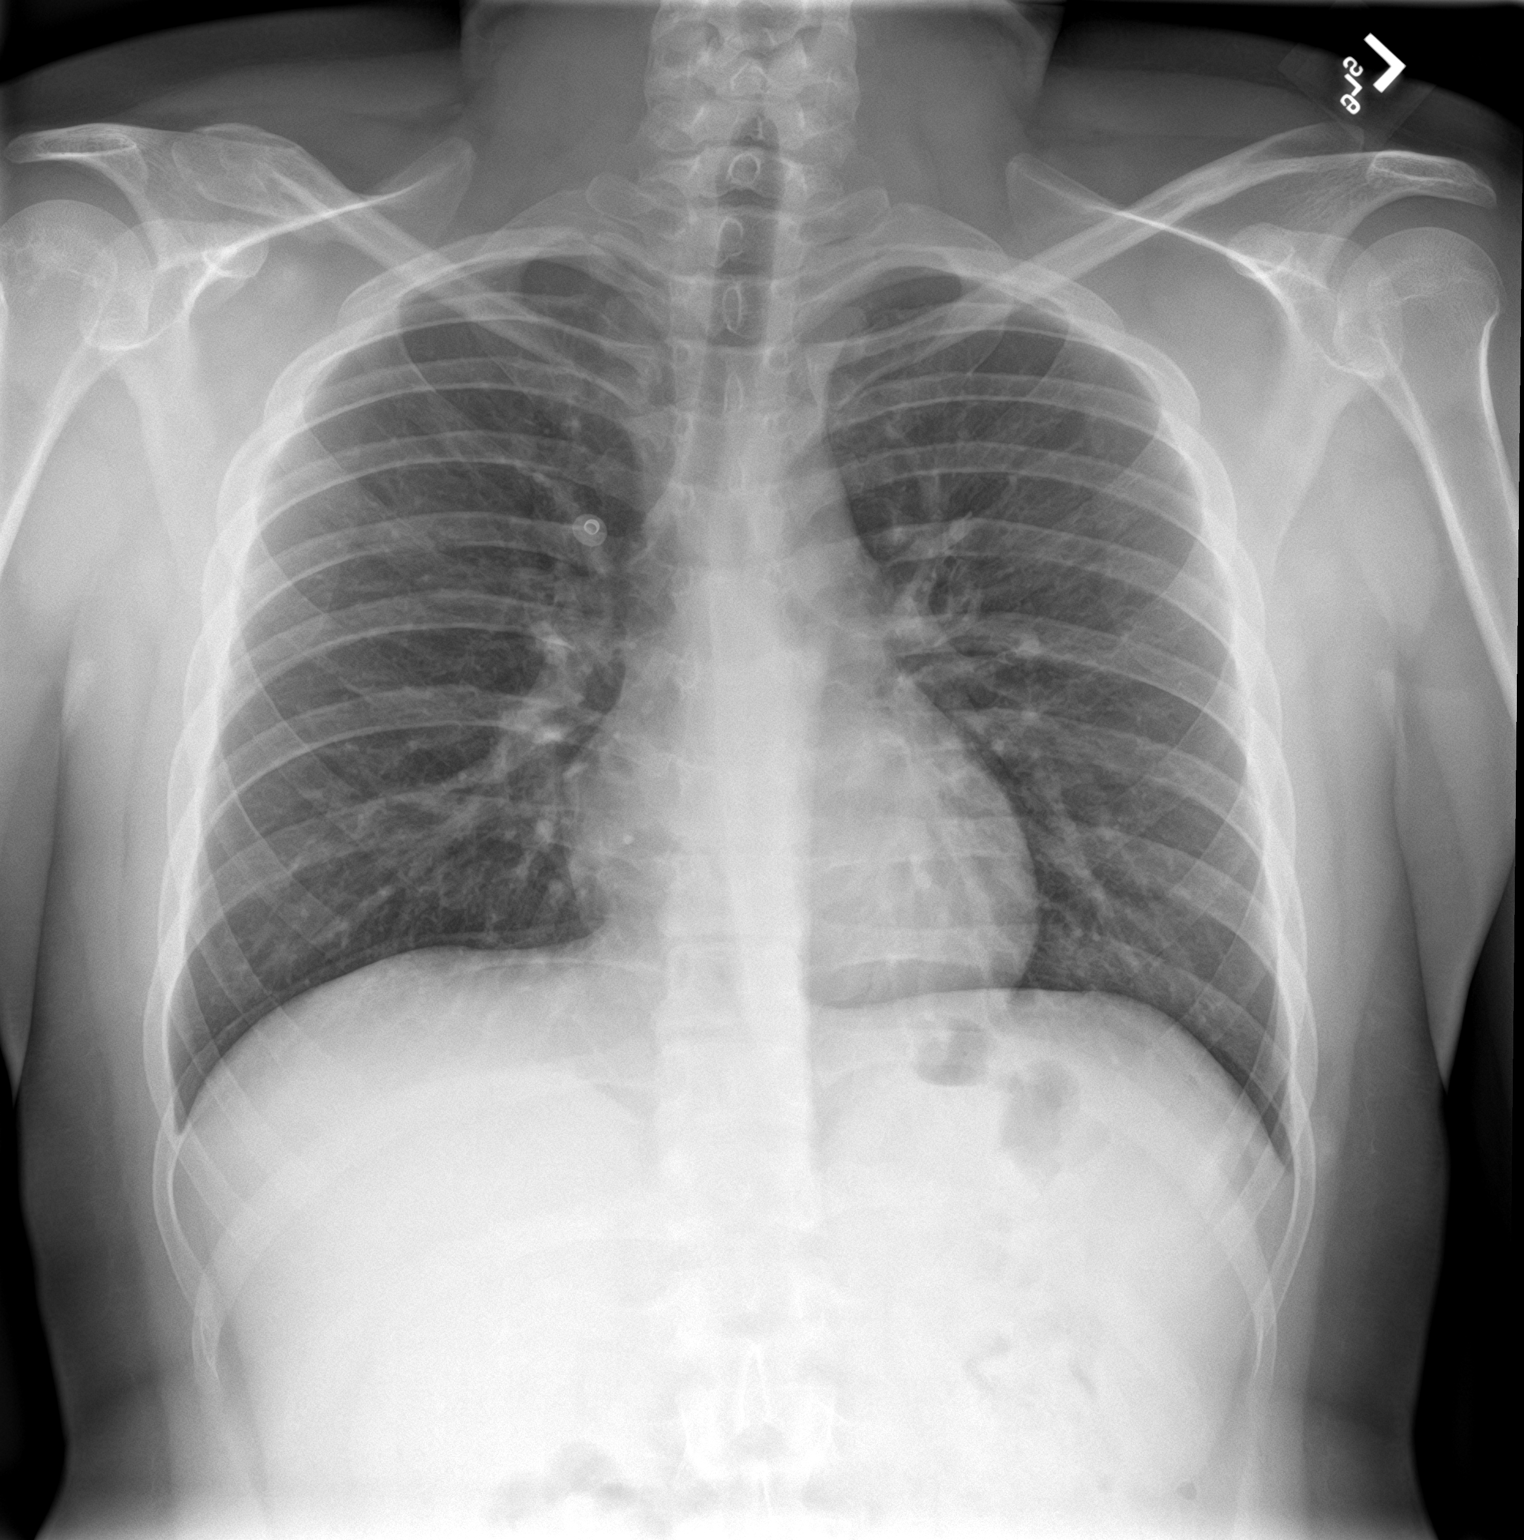

[chest lat]
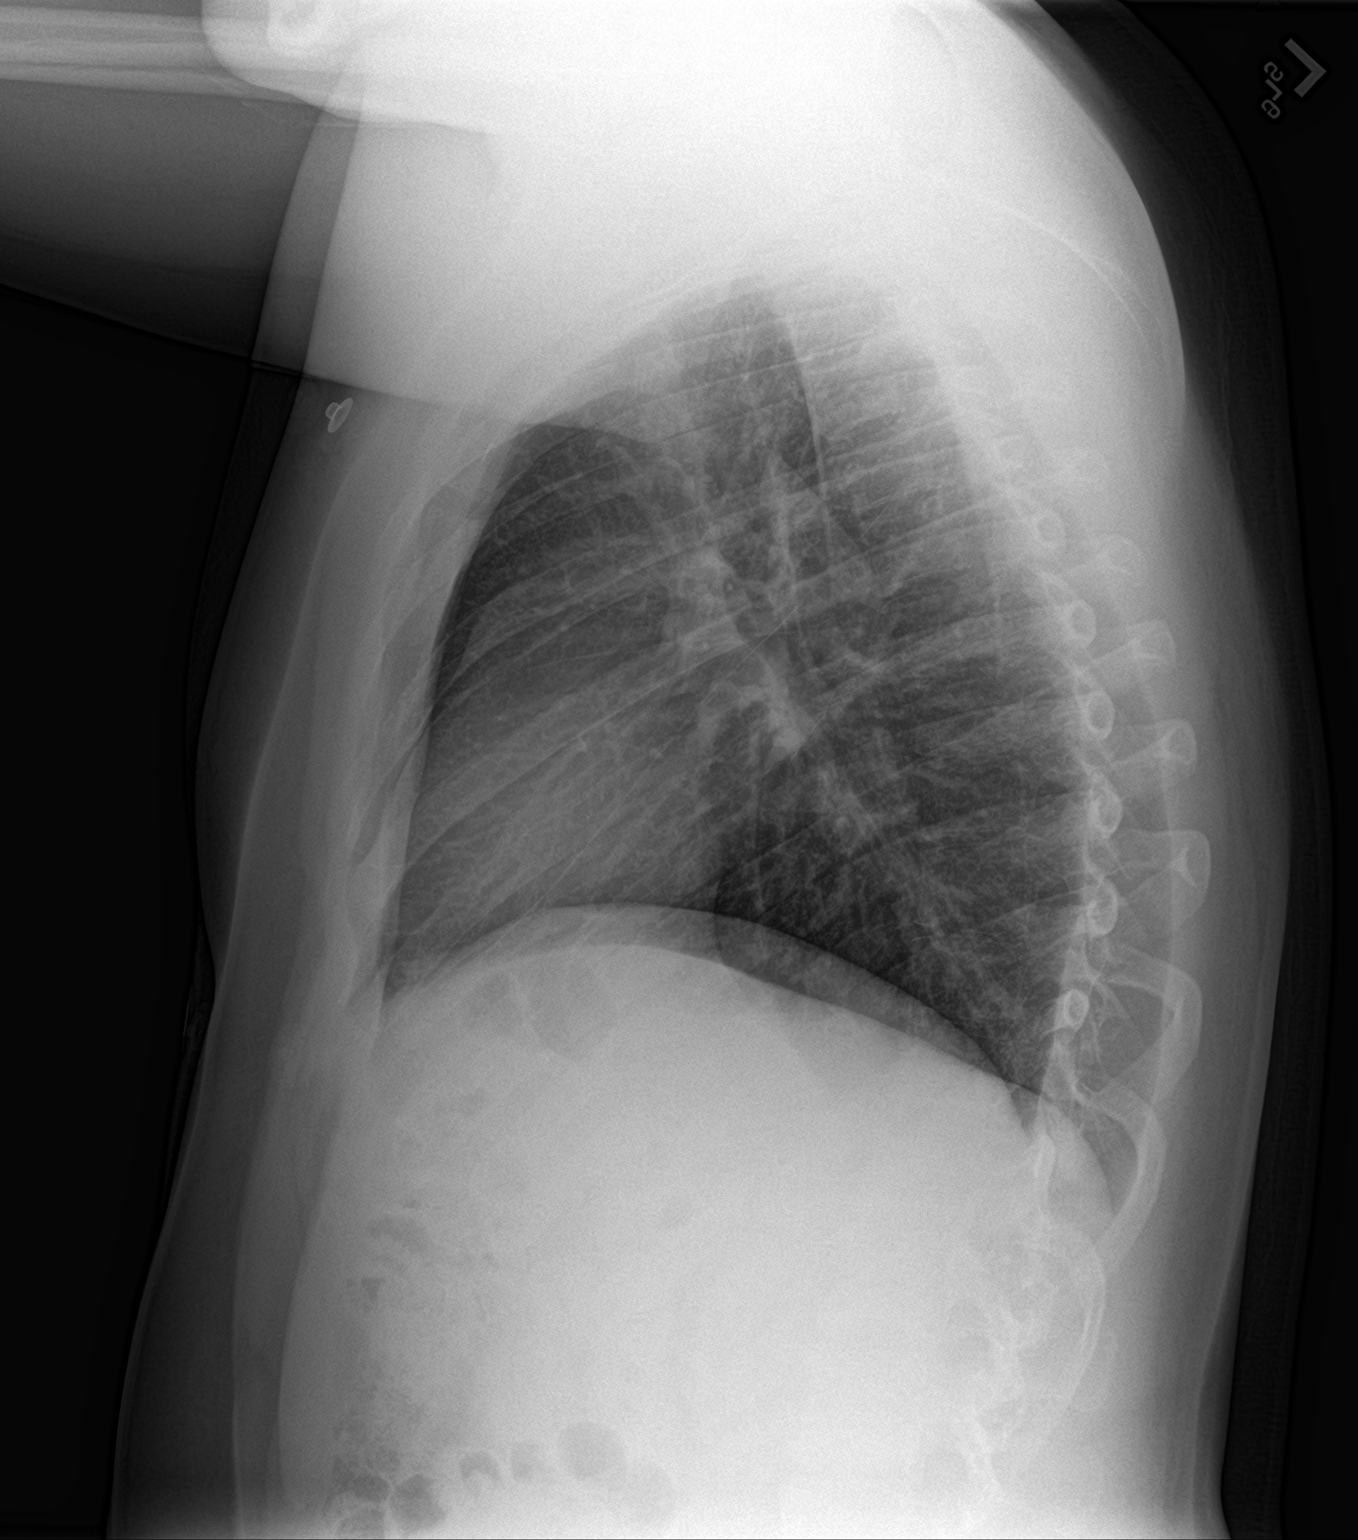

[2 of 2 positions shown; findings below may reference images not displayed]

FINDINGS: Normal heart, mediastinum and hila.

Clear lungs.  No pleural effusion or pneumothorax.

Old healed distal right clavicle fracture. Skeletal structures
otherwise unremarkable.
IMPRESSION: No active cardiopulmonary disease.

## 2020-10-21 MED ORDER — LORAZEPAM 2 MG/ML IJ SOLN
0.5000 mg | Freq: Once | INTRAMUSCULAR | Status: AC
  Administered 2020-10-21: 0.5 mg via INTRAVENOUS
  Filled 2020-10-21: qty 1

## 2020-10-21 MED ORDER — IOHEXOL 350 MG/ML SOLN
75.0000 mL | Freq: Once | INTRAVENOUS | Status: AC | PRN
  Administered 2020-10-21: 75 mL via INTRAVENOUS

## 2020-10-21 MED ORDER — PANTOPRAZOLE SODIUM 40 MG IV SOLR
40.0000 mg | Freq: Once | INTRAVENOUS | Status: AC
  Administered 2020-10-21: 40 mg via INTRAVENOUS
  Filled 2020-10-21: qty 40

## 2020-10-21 MED ORDER — PANTOPRAZOLE SODIUM 20 MG PO TBEC: 20.0000 mg | DELAYED_RELEASE_TABLET | Freq: Every day | ORAL | 0 refills | Status: DC

## 2020-10-21 MED ORDER — SODIUM CHLORIDE 0.9 % IV BOLUS
1000.0000 mL | Freq: Once | INTRAVENOUS | Status: AC
  Administered 2020-10-21: 1000 mL via INTRAVENOUS

## 2020-10-21 MED ORDER — ONDANSETRON HCL 4 MG/2ML IJ SOLN
4.0000 mg | Freq: Once | INTRAMUSCULAR | Status: AC
  Administered 2020-10-21: 4 mg via INTRAVENOUS
  Filled 2020-10-21: qty 2

## 2020-10-21 MED ORDER — ALUM & MAG HYDROXIDE-SIMETH 200-200-20 MG/5ML PO SUSP
30.0000 mL | Freq: Once | ORAL | Status: AC
  Administered 2020-10-21: 30 mL via ORAL
  Filled 2020-10-21: qty 30

## 2020-10-21 NOTE — ED Provider Notes (Signed)
Emergency Medicine Provider Triage Evaluation Note  Thomas Maddox , a 20 y.o. male  was evaluated upon arrival by EMS.  Acute onset chest pain while asleep.  Having some burping.  [place].  Type I diabetic.  Was recently told by his primary care doctor that he has "some inflammation around my heart and lungs" and that he should stop vaping but he still continues to do so a little bit.  Review of Systems  Positive: Chest pain with some associated shortness of breath, sweating Negative: Fever, nausea, vomiting, abdominal pain  Physical Exam  BP (!) 139/97 (BP Location: Right Arm)   Pulse (!) 125   Temp 97.9 F (36.6 C) (Oral)   Resp 18   SpO2 96%   Gen:   Awake, no distress   Resp:  Normal effort MSK:   Moves extremities without difficulty, no neuro deficits Other:  diaphoretic, anxious  Medical Decision Making  Medically screening exam initiated at 6:22am .  Appropriate orders placed.  Cai Anfinson. was informed that the remainder of the evaluation will be completed by another provider, this initial triage assessment does not replace that evaluation, and the importance of remaining in the ED until their evaluation is complete.  Suspect anxiety or acid reflux, possibly pneumomediastinum, doubt PE and ACS.  Vital stable other than some mild tachycardia.  Patient is a type I diabetic as well but that does not seem to be contributory at this time and his fingerstick blood glucose was in the 200s which is essentially normal for him.  Ordering two-view chest x-ray and standard labs.  ED ECG REPORT I, Loleta Rose, the attending physician, personally viewed and interpreted this ECG.  Date: 10/21/2020 EKG Time: 6:27am Rate: 110 Rhythm: Sinus tachycardia QRS Axis: normal Intervals: Left posterior fascicular block ST/T Wave abnormalities: Non-specific ST segment / T-wave changes, but no clear evidence of acute  ischemia. Narrative Interpretation: no definitive evidence of acute ischemia; does not meet STEMI criteria.   Loleta Rose, MD 10/21/20 [phone]

## 2020-10-21 NOTE — ED Notes (Signed)
Patient transported to CT 

## 2020-10-21 NOTE — ED Provider Notes (Signed)
[facility] Emergency Department Provider Note  ____________________________________________   Event Date/Time   First MD Initiated Contact with Patient 10/21/20 0700     (approximate)  I have reviewed the triage vital signs and the nursing notes.   HISTORY  Chief Complaint Chest Pain (Chest pain woke from sleep started about 0400. )    HPI Thomas Maddox. is a 20 y.o. male with type 1 diabetes who comes in with upper chest pain.  Patient reports that around 6 AM he awoke from sleep with a burning sensation in his chest that was constant, nothing made it better, nothing made it worse.  He does report some shortness of breath associated with it and a little bit of pleuritic component as well.  Denies any unilateral leg swelling, history of blood clots.  Denies any abdominal pain.  Reports a history of GERD but states that this feels different.  Denies alcohol or drug use yesterday.  Does vape but states that he is cut down.  He does have some associated nausea but no vomiting.  Patient denies any ear pain but also reports having pain and a mass behind his left ear.          Past Medical History:  Diagnosis Date   ADHD (attention deficit hyperactivity disorder)    Diabetes mellitus without complication [facility])     Patient Active Problem List   Diagnosis Date Noted   Intractable vomiting with nausea 07/04/2019   ADHD 07/04/2019   Tachycardia 07/04/2019   Type 1 diabetes mellitus without complication ([place]) 56/15/3794   AKI (acute kidney injury) ([place]) 02/09/2016   DKA, type 1 ([place]) 03/27/2015   DKA (diabetic ketoacidoses) 03/17/2015    Past Surgical History:  Procedure Laterality Date   NO PAST SURGERIES      Prior to Admission medications   Medication Sig Start Date End Date Taking? Authorizing Provider  amphetamine-dextroamphetamine (ADDERALL XR) 10 MG 24 hr capsule Take 10 mg by mouth every morning.     [provider]   amphetamine-dextroamphetamine (ADDERALL) 10 MG tablet Take 10 mg by mouth daily at 2 PM.     [provider]  blood glucose meter kit and supplies KIT Dispense based on patient and insurance preference. Use up to four times daily as directed. (FOR ICD-9 250.00, 250.01). 07/04/19   Elodia Florence., MD  famotidine (PEPCID) 20 MG tablet Take 1 tablet (20 mg total) by mouth 2 (two) times daily. 10/07/20 10/07/21  Rudene Re, MD  insulin aspart (NOVOLOG) 100 UNIT/ML FlexPen Inject 8 Units into the skin 3 (three) times daily with meals. 07/04/19   Elodia Florence., MD  Insulin Pen Needle 32G X 4 MM MISC Use with insulin 07/04/19   Elodia Florence., MD  metoCLOPramide (REGLAN) 10 MG tablet Take 1 tablet (10 mg total) by mouth every 8 (eight) hours as needed for up to 3 days for nausea. 10/07/20 10/10/20  Rudene Re, MD    Allergies Patient has no known allergies.  Family History  Problem Relation Age of Onset   Hypertension Other     Social History Social History   Tobacco Use   Smoking status: Never   Smokeless tobacco: Never  Vaping Use   Vaping Use: Never used  Substance Use Topics   Alcohol use: No   Drug use: No      Review of Systems Constitutional: No fever/chills Eyes: No visual changes. ENT: No sore throat.  Positive left ear pain Cardiovascular: Positive chest pain Respiratory: Positive shortness of breath Gastrointestinal: No abdominal pain.  Positive nausea, no vomiting.  No diarrhea.  No constipation. Genitourinary: Negative for dysuria. Musculoskeletal: Negative for back pain. Skin: Negative for rash. Neurological: Negative for headaches, focal weakness or numbness. All other ROS negative ____________________________________________   PHYSICAL EXAM:  VITAL SIGNS: ED Triage Vitals  Enc Vitals Group     BP 10/21/20 0631 (!) 139/97     Pulse Rate 10/21/20 0631 (!) 125     Resp 10/21/20 0631 18     Temp 10/21/20 0631 97.9  F (36.6 C)     Temp Source 10/21/20 0631 Oral     SpO2 10/21/20 0631 96 %     Weight --      Height --      Head Circumference --      Peak Flow --      Pain Score 10/21/20 0635 8     Pain Loc --      Pain Edu? --      Excl. in [place]? --     Constitutional: Alert and oriented. Well appearing and in no acute distress. Eyes: Conjunctivae are normal. EOMI. Head: Atraumatic.  Patient has some mild tenderness and nodule noted behind his left ear without any erythema or warmth noted.  TMs appear without any evidence of otitis media bilaterally Nose: No congestion/rhinnorhea. Mouth/Throat: Mucous membranes are moist.   Neck: No stridor. Trachea Midline. FROM Cardiovascular: Tachycardic, regular rhythm. Grossly normal heart sounds.  Good peripheral circulation.  Some reproducible midline chest wall tenderness Respiratory: Normal respiratory effort.  No retractions. Lungs CTAB. Gastrointestinal: Soft and nontender. No distention. No abdominal bruits.  Musculoskeletal: No lower extremity tenderness nor edema.  No joint effusions. Neurologic:  Normal speech and language. No gross focal neurologic deficits are appreciated.  Skin:  Skin is warm, dry and intact. No rash noted. Psychiatric: Mood and affect are normal. Speech and behavior are normal. GU: Deferred   ____________________________________________   LABS (all labs ordered are listed, but only abnormal results are displayed)  Labs Reviewed  BASIC METABOLIC PANEL - Abnormal; Notable for the following components:      Result Value   CO2 20 (*)    Glucose, Bld 339 (*)    All other components within normal limits  CBC WITH DIFFERENTIAL/PLATELET - Abnormal; Notable for the following components:   WBC 12.7 (*)    All other components within normal limits  CK  TSH  T4, FREE  D-DIMER, QUANTITATIVE  HEPATIC FUNCTION PANEL  LIPASE, BLOOD  TROPONIN I (HIGH SENSITIVITY)   ____________________________________________   ED ECG  REPORT I, Vanessa Woodruff, the attending physician, personally viewed and interpreted this ECG.  Sinus tachycardia rate of 110, no ST elevation, no T wave inversions, normal intervals ____________________________________________  RADIOLOGY Robert Bellow, personally viewed and evaluated these images (plain radiographs) as part of my medical decision making, as well as reviewing the written report by the radiologist.  ED MD interpretation: No pneumonia  Official radiology report(s): DG Chest 2 View  Result Date: 10/21/2020 CLINICAL DATA:  Chest pain this morning. EXAM: CHEST - 2 VIEW COMPARISON:  03/17/2015 FINDINGS: Normal heart, mediastinum and hila. Clear lungs.  No pleural effusion or pneumothorax. Old healed distal right clavicle fracture. Skeletal structures otherwise unremarkable. IMPRESSION: No active cardiopulmonary disease. Electronically Signed   By: Lajean Manes M.D.   On: 10/21/2020 07:30    ____________________________________________   PROCEDURES  Procedure(s) performed (including Critical Care):  .1-3 Lead EKG Interpretation  Date/Time: 10/21/2020 8:13 AM Performed by: Vanessa [place], MD Authorized by: Vanessa [place], MD     Interpretation: normal     ECG rate:  100s   ECG rate assessment: tachycardic     Rhythm: sinus tachycardia     Ectopy: none     Conduction: normal     ____________________________________________   INITIAL IMPRESSION / ASSESSMENT AND PLAN / ED COURSE   Shah Insley. was evaluated in Emergency Department on 10/21/2020 for the symptoms described in the history of present illness. He was evaluated in the context of the global COVID-19 pandemic, which necessitated consideration that the patient might be at risk for infection with the SARS-CoV-2 virus that causes COVID-19. Institutional protocols and algorithms that pertain to the evaluation of patients at risk for COVID-19 are in a state of rapid change based on information released by  regulatory bodies including the CDC and federal and state organizations. These policies and algorithms were followed during the patient's care in the ED.    Most Likely DDx:  -MSK (atypical chest pain) versus anxiety versus GERD.  However given the tachycardia and shortness of breath will get D-dimer.  Patient will be kept on the cardiac monitor to evaluate for arrhythmia and will be treated with medications to help discomfort   DDx that was also considered d/t potential to cause harm, but was found less likely based on history and physical (as detailed above): -PNA (no fevers, cough but CXR to evaluate) -PNX (reassured with equal b/l breath sounds, CXR to evaluate) -Symptomatic anemia (will get H&H) -Aortic Dissection as no tearing pain and no radiation to the mid back, pulses equal -Pericarditis no rub on exam, EKG changes or hx to suggest dx -Tamponade (no notable SOB, tachycardic, hypotensive) -Esophageal rupture (no h/o diffuse vomitting/no crepitus)  D-dimer is elevated will get CT PE.  Given patient's nodule behind the left ear we will also get CT temporal to make sure no evidence of mastoiditis although it seems less likely.  CT imaging is negative and CT temporal just shows a lymph node.  Patient already has follow-up with his primary care doctor for this.  Instructed him that if it does not go away in 4 to 6 weeks he may need additional testing of it.  At this time I do not feel like patient's chest pain is related to his heart or his lungs I feel like patient is safe for discharge home.  I suspect this more likely related to acid versus anxiety.  His sugars are elevated but he typically chronically runs elevated.  I explained to patient the importance of getting this under control and talking to his primary care doctor.  I discussed the provisional nature of ED diagnosis, the treatment so far, the ongoing plan of care, follow up appointments and return precautions with the patient and  any family or support people present. They expressed understanding and agreed with the plan, discharged home.       ____________________________________________   FINAL CLINICAL IMPRESSION(S) / ED DIAGNOSES   Final diagnoses:  Chest pain, unspecified type  Hyperglycemia     MEDICATIONS GIVEN DURING THIS VISIT:  Medications  sodium chloride 0.9 % bolus 1,000 mL (0 mLs Intravenous Stopped 10/21/20 0937)  alum & mag hydroxide-simeth (MAALOX/MYLANTA) 200-200-20 MG/5ML suspension 30 mL (30 mLs Oral Given 10/21/20 0813)  pantoprazole (PROTONIX) injection 40 mg (40 mg Intravenous Given 10/21/20 0813)  ondansetron [facility]) injection 4 mg (4 mg Intravenous Given 10/21/20 0812)  LORazepam (ATIVAN) injection 0.5 mg (0.5 mg Intravenous Given 10/21/20 0939)  iohexol (OMNIPAQUE) 350 MG/ML injection 75 mL (75 mLs Intravenous Contrast Given 10/21/20 1032)     ED Discharge Orders          Ordered    pantoprazole (PROTONIX) 20 MG tablet  Daily        10/21/20 1128             Note:  This document was prepared using Dragon voice recognition software and may include unintentional dictation errors.    Vanessa Assumption, MD 10/21/20 [phone]

## 2020-10-21 NOTE — Discharge Instructions (Addendum)
I think her chest pain could be related to acid reflux and you should be taking a acid reducer called Protonix.  There was no signs of any issues with your lungs or a heart attack.  Your sugars were also elevated and this needs to be followed up with your primary care doctor.  Your CT scan shows a lymph node behind her left ear and this will need to be monitored by your primary doctor as well  Return to the ER for shortness of breath, any other concerns  IMPRESSION: 1. Normal CT appearance of the bilateral temporal bones. 2. Mildly enlarged and reactive appearing left level 5 lymph nodes posterior to the pinna. But other periauricular soft tissues appear normal.

## 2020-10-21 NOTE — ED Notes (Signed)
Pt states that he is having intermittent periods of shaking and tachycardia. Pt HR is 104 at this moment. EDP made aware.

## 2020-10-21 NOTE — ED Triage Notes (Signed)
Chest pain woke pt up around 0400 this am. Diaphoretic.  [place] and [place]. States he at [employer] for dinner and is still vaping even though he was advised to stop.

## 2021-01-29 ENCOUNTER — Ambulatory Visit: Payer: Medicaid Other | Admitting: Gastroenterology

## 2021-01-29 ENCOUNTER — Other Ambulatory Visit: Payer: Self-pay

## 2021-01-29 ENCOUNTER — Encounter: Payer: Self-pay | Admitting: Gastroenterology

## 2021-01-29 VITALS — BP 123/85 | HR 103 | Temp 98.7°F | Ht 66.0 in | Wt 179.2 lb

## 2021-01-29 DIAGNOSIS — R1319 Other dysphagia: Secondary | ICD-10-CM

## 2021-01-29 DIAGNOSIS — K219 Gastro-esophageal reflux disease without esophagitis: Secondary | ICD-10-CM

## 2021-01-29 NOTE — Progress Notes (Signed)
Cephas Darby, MD [address]  [place]  [place], [place] [address]  Main: [phone]  Fax: [phone]    Gastroenterology Consultation  Referring Provider:     Donnie Coffin, MD Primary Care Physician:  Donnie Coffin, MD Primary Gastroenterologist:  Dr. Cephas Darby Reason for Consultation:     GERD, Dysphagia        HPI:   Thomas Cleland. is a 20 y.o. male referred by Dr. Donnie Coffin, MD  for consultation & management of chronic GERD and choking sensation.  Patient has history of type 1 diabetes insulin-dependent, not well controlled.  Patient reports that he has been experiencing reflux, burning in his chest and intermittent episodes of food getting stuck in his throat, epigastric pain.  Patient is currently taking Nexium 40 mg daily and also Protonix 20 mg daily with partial relief only.  Patient is also taking Pepcid as needed.  He reports that he has been experiencing the symptoms most of the days in a week.  He does report regurgitation as well.  He does drink 3 cans of diet soda daily.  He reports that he has been on acid reducer.  His weight has been stable.  He reports undergoing upper endoscopy several years ago and was reportedly normal.  He has been experiencing reflux symptoms since 2017  His blood sugars have been in 150s to 200s lately.  Patient denies tobacco use or alcohol use He works from home and takes care of his 2 siblings  NSAIDs: None  Antiplts/Anticoagulants/Anti thrombotics: None  GI Procedures: Upper endoscopy several years ago, reportedly normal He denies family history of GI malignancy  Past Medical History:  Diagnosis Date   ADHD (attention deficit hyperactivity disorder)    Diabetes mellitus without complication ([place])     Past Surgical History:  Procedure Laterality Date   NO PAST SURGERIES      Current Outpatient Medications:    amphetamine-dextroamphetamine (ADDERALL XR) 10 MG 24 hr capsule, Take 10 mg by mouth  every morning. , Disp: , Rfl:    amphetamine-dextroamphetamine (ADDERALL) 10 MG tablet, Take 10 mg by mouth daily at 2 PM. , Disp: , Rfl:    blood glucose meter kit and supplies KIT, Dispense based on patient and insurance preference. Use up to four times daily as directed. (FOR ICD-9 250.00, 250.01)., Disp: 1 each, Rfl: 0   cetirizine (ZYRTEC) 10 MG tablet, Take by mouth., Disp: , Rfl:    esomeprazole (NEXIUM) 40 MG capsule, Take 40 mg by mouth daily., Disp: , Rfl:    famotidine (PEPCID) 20 MG tablet, Take 1 tablet (20 mg total) by mouth 2 (two) times daily., Disp: 60 tablet, Rfl: 1   hydrOXYzine (VISTARIL) 25 MG capsule, Take 25 mg by mouth 3 (three) times daily as needed., Disp: , Rfl:    insulin aspart (NOVOLOG) 100 UNIT/ML FlexPen, Inject 8 Units into the skin 3 (three) times daily with meals., Disp: 15 mL, Rfl: 1   insulin degludec (TRESIBA) 100 UNIT/ML FlexTouch Pen, Inject into the skin., Disp: , Rfl:    Insulin Pen Needle 32G X 4 MM MISC, Use with insulin, Disp: 200 each, Rfl: 1   NOVOLOG MIX 70/30 FLEXPEN (70-30) 100 UNIT/ML FlexPen, Inject into the skin., Disp: , Rfl:    omeprazole (PRILOSEC) 40 MG capsule, Take 40 mg by mouth daily., Disp: , Rfl:    metoCLOPramide (REGLAN) 10 MG tablet, Take 1 tablet (10 mg total) by  mouth every 8 (eight) hours as needed for up to 3 days for nausea., Disp: 20 tablet, Rfl: 0   pantoprazole (PROTONIX) 20 MG tablet, Take 1 tablet (20 mg total) by mouth daily for 14 days., Disp: 14 tablet, Rfl: 0    Family History  Problem Relation Age of Onset   Hypertension Other      Social History   Tobacco Use   Smoking status: Never   Smokeless tobacco: Never  Vaping Use   Vaping Use: Never used  Substance Use Topics   Alcohol use: No   Drug use: No    Allergies as of 01/29/2021   (No Known Allergies)    Review of Systems:    All systems reviewed and negative except where noted in HPI.   Physical Exam:  BP 123/85 (BP Location: Right Arm,  Patient Position: Sitting, Cuff Size: Normal)   Pulse (!) 103   Temp 98.7 F (37.1 C) (Oral)   Ht 5' 6"  (1.676 m)   Wt 179 lb 3.2 oz (81.3 kg)   BMI 28.92 kg/m  No LMP for male patient.  General:   Alert,  Well-developed, well-nourished, pleasant and cooperative in NAD Head:  Normocephalic and atraumatic. Eyes:  Sclera clear, no icterus.   Conjunctiva pink. Ears:  Normal auditory acuity. Nose:  No deformity, discharge, or lesions. Mouth:  No deformity or lesions,oropharynx pink & moist. Neck:  Supple; no masses or thyromegaly. Lungs:  Respirations even and unlabored.  Clear throughout to auscultation.   No wheezes, crackles, or rhonchi. No acute distress. Heart:  Regular rate and rhythm; no murmurs, clicks, rubs, or gallops. Abdomen:  Normal bowel sounds. Soft, non-tender and non-distended without masses, hepatosplenomegaly or hernias noted.  No guarding or rebound tenderness.   Rectal: Not performed Msk:  Symmetrical without gross deformities. Good, equal movement & strength bilaterally. Pulses:  Normal pulses noted. Extremities:  No clubbing or edema.  No cyanosis. Neurologic:  Alert and oriented x3;  grossly normal neurologically. Skin:  Intact without significant lesions or rashes. No jaundice. Psych:  Alert and cooperative. Normal mood and affect.  Imaging Studies: Reviewed  Assessment and Plan:   Thomas Maddox. is a 20 y.o. male with history of type 1 diabetes, poorly controlled with chronic GERD is seen in consultation for exacerbation of his GERD symptoms, dysphagia and epigastric pain  Differentials include erosive esophagitis or eosinophilic esophagitis or Candida esophagitis or peptic ulcer disease or H. pylori gastritis or gastroparesis Recommend EGD with esophageal biopsies and gastric biopsies Continue Nexium 40 mg 1-2 times daily before meals Strongly advised patient to stop carbonated beverages Discussed about GERD lifestyle, information  provided Discussed about tight control of diabetes   Follow up in 4 months   Cephas Darby, MD

## 2021-01-29 NOTE — Patient Instructions (Signed)
Gastroesophageal Reflux Disease, Adult Gastroesophageal reflux (GER) happens when acid from the stomach flows up into the tube that connects the mouth and the stomach (esophagus). Normally, food travels down the esophagus and stays in the stomach to be digested. With GER, food and stomach acid sometimes move back up into the esophagus. You may have a disease called gastroesophageal reflux disease (GERD) if the reflux: Happens often. Causes frequent or very bad symptoms. Causes problems such as damage to the esophagus. When this happens, the esophagus becomes sore and swollen. Over time, GERD can make small holes (ulcers) in the lining of the esophagus. What are the causes? This condition is caused by a problem with the muscle between the esophagus and the stomach. When this muscle is weak or not normal, it does not close properly to keep food and acid from coming back up from the stomach. The muscle can be weak because of: Tobacco use. Pregnancy. Having a certain type of hernia (hiatal hernia). Alcohol use. Certain foods and drinks, such as coffee, chocolate, onions, and peppermint. What increases the risk? Being overweight. Having a disease that affects your connective tissue. Taking NSAIDs, such a ibuprofen. What are the signs or symptoms? Heartburn. Difficult or painful swallowing. The feeling of having a lump in the throat. A bitter taste in the mouth. Bad breath. Having a lot of saliva. Having an upset or bloated stomach. Burping. Chest pain. Different conditions can cause chest pain. Make sure you see your doctor if you have chest pain. Shortness of breath or wheezing. A long-term cough or a cough at night. Wearing away of the surface of teeth (tooth enamel). Weight loss. How is this treated? Making changes to your diet. Taking medicine. Having surgery. Treatment will depend on how bad your symptoms are. Follow these instructions at home: Eating and drinking  Follow a  diet as told by your doctor. You may need to avoid foods and drinks such as: Coffee and tea, with or without caffeine. Drinks that contain alcohol. Energy drinks and sports drinks. Bubbly (carbonated) drinks or sodas. Chocolate and cocoa. Peppermint and mint flavorings. Garlic and onions. Horseradish. Spicy and acidic foods. These include peppers, chili powder, curry powder, vinegar, hot sauces, and BBQ sauce. Citrus fruit juices and citrus fruits, such as oranges, lemons, and limes. Tomato-based foods. These include red sauce, chili, salsa, and pizza with red sauce. Fried and fatty foods. These include donuts, french fries, potato chips, and high-fat dressings. High-fat meats. These include hot dogs, rib eye steak, sausage, ham, and bacon. High-fat dairy items, such as whole milk, butter, and cream cheese. Eat small meals often. Avoid eating large meals. Avoid drinking large amounts of liquid with your meals. Avoid eating meals during the 2-3 hours before bedtime. Avoid lying down right after you eat. Do not exercise right after you eat. Lifestyle  Do not smoke or use any products that contain nicotine or tobacco. If you need help quitting, ask your doctor. Try to lower your stress. If you need help doing this, ask your doctor. If you are overweight, lose an amount of weight that is healthy for you. Ask your doctor about a safe weight loss goal. General instructions Pay attention to any changes in your symptoms. Take over-the-counter and prescription medicines only as told by your doctor. Do not take aspirin, ibuprofen, or other NSAIDs unless your doctor says it is okay. Wear loose clothes. Do not wear anything tight around your waist. Raise (elevate) the head of your bed about 6   inches (15 cm). You may need to use a wedge to do this. Avoid bending over if this makes your symptoms worse. Keep all follow-up visits. Contact a doctor if: You have new symptoms. You lose weight and you  do not know why. You have trouble swallowing or it hurts to swallow. You have wheezing or a cough that keeps happening. You have a hoarse voice. Your symptoms do not get better with treatment. Get help right away if: You have sudden pain in your arms, neck, jaw, teeth, or back. You suddenly feel sweaty, dizzy, or light-headed. You have chest pain or shortness of breath. You vomit and the vomit is green, yellow, or black, or it looks like blood or coffee grounds. You faint. Your poop (stool) is red, bloody, or black. You cannot swallow, drink, or eat. These symptoms may represent a serious problem that is an emergency. Do not wait to see if the symptoms will go away. Get medical help right away. Call your local emergency services (911 in the [place] Do not drive yourself to the hospital. Summary If a person has gastroesophageal reflux disease (GERD), food and stomach acid move back up into the esophagus and cause symptoms or problems such as damage to the esophagus. Treatment will depend on how bad your symptoms are. Follow a diet as told by your doctor. Take all medicines only as told by your doctor. This information is not intended to replace advice given to you by your health care provider. Make sure you discuss any questions you have with your health care provider. Document Revised: 10/16/2019 Document Reviewed: 10/16/2019 Elsevier Patient Education  2022 [employer]  

## 2021-02-24 ENCOUNTER — Encounter: Payer: Self-pay | Admitting: Gastroenterology

## 2021-02-25 ENCOUNTER — Encounter: Payer: Self-pay | Admitting: Gastroenterology

## 2021-02-25 ENCOUNTER — Ambulatory Visit: Payer: Medicaid Other | Admitting: Anesthesiology

## 2021-02-25 ENCOUNTER — Ambulatory Visit
Admission: RE | Admit: 2021-02-25 | Discharge: 2021-02-25 | Disposition: A | Discharge status: Home / Self Care | Payer: Medicaid Other | Attending: Gastroenterology | Admitting: Gastroenterology

## 2021-02-25 ENCOUNTER — Encounter
Admission: RE | Disposition: A | Discharge status: Home / Self Care | Payer: Self-pay | Source: Home / Self Care | Attending: Gastroenterology

## 2021-02-25 ENCOUNTER — Other Ambulatory Visit: Payer: Self-pay

## 2021-02-25 DIAGNOSIS — Z794 Long term (current) use of insulin: Secondary | ICD-10-CM | POA: Insufficient documentation

## 2021-02-25 DIAGNOSIS — K3189 Other diseases of stomach and duodenum: Secondary | ICD-10-CM | POA: Insufficient documentation

## 2021-02-25 DIAGNOSIS — R1013 Epigastric pain: Secondary | ICD-10-CM | POA: Diagnosis present

## 2021-02-25 DIAGNOSIS — K2970 Gastritis, unspecified, without bleeding: Secondary | ICD-10-CM

## 2021-02-25 DIAGNOSIS — K2950 Unspecified chronic gastritis without bleeding: Secondary | ICD-10-CM | POA: Diagnosis not present

## 2021-02-25 DIAGNOSIS — K2100 Gastro-esophageal reflux disease with esophagitis, without bleeding: Secondary | ICD-10-CM | POA: Diagnosis not present

## 2021-02-25 DIAGNOSIS — R1310 Dysphagia, unspecified: Secondary | ICD-10-CM | POA: Diagnosis present

## 2021-02-25 DIAGNOSIS — R1319 Other dysphagia: Secondary | ICD-10-CM

## 2021-02-25 DIAGNOSIS — E1065 Type 1 diabetes mellitus with hyperglycemia: Secondary | ICD-10-CM | POA: Insufficient documentation

## 2021-02-25 DIAGNOSIS — E109 Type 1 diabetes mellitus without complications: Secondary | ICD-10-CM

## 2021-02-25 DIAGNOSIS — F909 Attention-deficit hyperactivity disorder, unspecified type: Secondary | ICD-10-CM | POA: Insufficient documentation

## 2021-02-25 HISTORY — PX: ESOPHAGOGASTRODUODENOSCOPY (EGD) WITH PROPOFOL: SHX5813

## 2021-02-25 LAB — GLUCOSE, CAPILLARY
Glucose-Capillary: 312 mg/dL — ABNORMAL HIGH (ref 70–99)
Glucose-Capillary: 272 mg/dL — ABNORMAL HIGH (ref 70–99)

## 2021-02-25 SURGERY — ESOPHAGOGASTRODUODENOSCOPY (EGD) WITH PROPOFOL
Anesthesia: General

## 2021-02-25 MED ORDER — FENTANYL CITRATE (PF) 100 MCG/2ML IJ SOLN
INTRAMUSCULAR | Status: AC
  Filled 2021-02-25: qty 2

## 2021-02-25 MED ORDER — SODIUM CHLORIDE 0.9 % IV SOLN: INTRAVENOUS | Status: DC

## 2021-02-25 MED ORDER — PROPOFOL 500 MG/50ML IV EMUL
INTRAVENOUS | Status: AC
  Filled 2021-02-25: qty 50

## 2021-02-25 MED ORDER — DEXMEDETOMIDINE (PRECEDEX) IN NS 20 MCG/5ML (4 MCG/ML) IV SYRINGE
PREFILLED_SYRINGE | INTRAVENOUS | Status: DC | PRN
  Administered 2021-02-25: 20 ug via INTRAVENOUS

## 2021-02-25 MED ORDER — OMEPRAZOLE 40 MG PO CPDR: 40.0000 mg | DELAYED_RELEASE_CAPSULE | Freq: Two times a day (BID) | ORAL | 2 refills | Status: DC

## 2021-02-25 MED ORDER — FENTANYL CITRATE (PF) 100 MCG/2ML IJ SOLN
INTRAMUSCULAR | Status: DC | PRN
  Administered 2021-02-25: 25 ug via INTRAVENOUS

## 2021-02-25 MED ORDER — PROPOFOL 500 MG/50ML IV EMUL
INTRAVENOUS | Status: DC | PRN
  Administered 2021-02-25: 180 ug/kg/min via INTRAVENOUS

## 2021-02-25 NOTE — Op Note (Signed)
[facility] Patient Name: Thomas Maddox Procedure Date: 02/25/2021 8:11 AM MRN: [id number] Account #: [id number] Date of Birth: [birth year] Admit Type: Outpatient Age: 20 Room: [facility] ENDO ROOM 3 Gender: Male Note Status: Finalized Instrument Name: Upper Endoscope [id number] Procedure:             Upper GI endoscopy Indications:           Epigastric abdominal pain, Dysphagia, Esophageal                         reflux symptoms that persist despite appropriate                         therapy Providers:             Toney Reil MD, MD Referring MD:          Sylvie Farrier. Aycock MD (Referring MD) Medicines:             General Anesthesia Complications:         No immediate complications. Estimated blood loss: None. Procedure:             Pre-Anesthesia Assessment:                        - Prior to the procedure, a History and Physical was                         performed, and patient medications and allergies were                         reviewed. The patient is competent. The risks and                         benefits of the procedure and the sedation options and                         risks were discussed with the patient. All questions                         were answered and informed consent was obtained.                         Patient identification and proposed procedure were                         verified by the physician, the nurse, the                         anesthesiologist, the anesthetist and the technician                         in the pre-procedure area in the procedure room in the                         endoscopy suite. Mental Status Examination: alert and                         oriented. Airway Examination: normal oropharyngeal  airway and neck mobility. Respiratory Examination:                         clear to auscultation. CV Examination: normal.                         Prophylactic Antibiotics: The  patient does not require                         prophylactic antibiotics. Prior Anticoagulants: The                         patient has taken no previous anticoagulant or                         antiplatelet agents. ASA Grade Assessment: III - A                         patient with severe systemic disease. After reviewing                         the risks and benefits, the patient was deemed in                         satisfactory condition to undergo the procedure. The                         anesthesia plan was to use general anesthesia.                         Immediately prior to administration of medications,                         the patient was re-assessed for adequacy to receive                         sedatives. The heart rate, respiratory rate, oxygen                         saturations, blood pressure, adequacy of pulmonary                         ventilation, and response to care were monitored                         throughout the procedure. The physical status of the                         patient was re-assessed after the procedure.                        After obtaining informed consent, the endoscope was                         passed under direct vision. Throughout the procedure,                         the patient's blood pressure, pulse, and oxygen  saturations were monitored continuously. The Endoscope                         was introduced through the mouth, and advanced to the                         second part of duodenum. The upper GI endoscopy was                         accomplished without difficulty. The patient tolerated                         the procedure well. Findings:      The duodenal bulb and second portion of the duodenum were normal.      Diffuse severely erythematous mucosa without bleeding was found in the       entire examined stomach. Biopsies were taken with a cold forceps for       histology.      The cardia and  gastric fundus were normal on retroflexion.      Esophagogastric landmarks were identified: the gastroesophageal junction       was found at 34 cm from the incisors.      The gastroesophageal junction and examined esophagus were normal.       Biopsies were taken with a cold forceps for histology. Impression:            - Normal duodenal bulb and second portion of the                         duodenum.                        - Erythematous mucosa in the stomach. Biopsied.                        - Esophagogastric landmarks identified.                        - Normal gastroesophageal junction and esophagus.                         Biopsied. Recommendation:        - Await pathology results.                        - Discharge patient to home (with escort).                        - Resume previous diet today.                        - Continue present medications.                        - Return to my office as previously scheduled. Procedure Code(s):     --- Professional ---                        [phone], Esophagogastroduodenoscopy, flexible,                         transoral; with  biopsy, single or multiple Diagnosis Code(s):     --- Professional ---                        K31.89, Other diseases of stomach and duodenum                        R10.13, Epigastric pain                        R13.10, Dysphagia, unspecified                        K21.9, Gastro-esophageal reflux disease without                         esophagitis CPT copyright 2019 American Medical Association. All rights reserved. The codes documented in this report are preliminary and upon coder review may  be revised to meet current compliance requirements. Dr. Libby Maw Toney Reil MD, MD 02/25/2021 8:27:18 AM This report has been signed electronically. Number of Addenda: 0 Note Initiated On: 02/25/2021 8:11 AM Estimated Blood Loss:  Estimated blood loss: none.      [facility]

## 2021-02-25 NOTE — Anesthesia Preprocedure Evaluation (Signed)
Anesthesia Evaluation  Patient identified by MRN, date of birth, ID band Patient awake    Reviewed: Allergy & Precautions, NPO status , Patient's Chart, lab work & pertinent test results  History of Anesthesia Complications Negative for: history of anesthetic complications  Airway Mallampati: II  TM Distance: >3 FB Neck ROM: Full    Dental no notable dental hx. (+) Teeth Intact   Pulmonary neg pulmonary ROS, neg sleep apnea, neg COPD, Patient abstained from smoking.Not current smoker,    Pulmonary exam normal breath sounds clear to auscultation       Cardiovascular Exercise Tolerance: Good METS(-) hypertension(-) CAD and (-) Past MI negative cardio ROS  (-) dysrhythmias  Rhythm:Regular Rate:Normal - Systolic murmurs    Neuro/Psych negative neurological ROS  negative psych ROS   GI/Hepatic GERD  Medicated and Poorly Controlled,(+)     (-) substance abuse  ,   Endo/Other  diabetes, Poorly Controlled, Type 1, Insulin DependentBlood sugar 312 in preop. Patient said he took an insulin dose right before arriving. He says his glucose readings vary, sometimes in 200s sometimes in 300s. I will defer giving him insulin preop given that this is not unusual for him and that he took an insulin dose before arriving. Will check glucose post op.  Renal/GU negative Renal ROS     Musculoskeletal   Abdominal   Peds  Hematology   Anesthesia Other Findings Past Medical History: No date: ADHD (attention deficit hyperactivity disorder) 02/09/2016: AKI (acute kidney injury) (HCC) No date: Diabetes mellitus without complication (HCC) 03/17/2015: DKA (diabetic ketoacidoses) 07/04/2019: Tachycardia  Reproductive/Obstetrics                             Anesthesia Physical Anesthesia Plan  ASA: 3  Anesthesia Plan: General   Post-op Pain Management:    Induction: Intravenous  PONV Risk Score and Plan: 2 and  Ondansetron, Propofol infusion and TIVA  Airway Management Planned: Nasal Cannula  Additional Equipment: None  Intra-op Plan:   Post-operative Plan:   Informed Consent: I have reviewed the patients History and Physical, chart, labs and discussed the procedure including the risks, benefits and alternatives for the proposed anesthesia with the patient or authorized representative who has indicated his/her understanding and acceptance.     Dental advisory given  Plan Discussed with: CRNA and Surgeon  Anesthesia Plan Comments: (Discussed risks of anesthesia with patient, including possibility of difficulty with spontaneous ventilation under anesthesia necessitating airway intervention, aspiration, PONV, and rare risks such as cardiac or respiratory or neurological events, and allergic reactions. Discussed possibility of needing ETT placement if too much gastric secretions and patient understands. Discussed the role of CRNA in patient's perioperative care. Patient understands.)        Anesthesia Quick Evaluation

## 2021-02-25 NOTE — H&P (Signed)
Cephas Darby, MD [address]  [place], [place] [address]  Main: [phone]  Fax: [phone] Pager: [phone]  Primary Care Physician:  Donnie Coffin, MD Primary Gastroenterologist:  Dr. Cephas Darby  Pre-Procedure History & Physical: HPI:  Thomas Marty. is a 20 y.o. male is here for an endoscopy.   Past Medical History:  Diagnosis Date   ADHD (attention deficit hyperactivity disorder)    AKI (acute kidney injury) ([place]) 02/09/2016   Diabetes mellitus without complication ([place])    DKA (diabetic ketoacidoses) 03/17/2015   Tachycardia 07/04/2019    Past Surgical History:  Procedure Laterality Date   NO PAST SURGERIES      Prior to Admission medications   Medication Sig Start Date End Date Taking? Authorizing Provider  amphetamine-dextroamphetamine (ADDERALL XR) 10 MG 24 hr capsule Take 10 mg by mouth every morning.    Yes [provider]  amphetamine-dextroamphetamine (ADDERALL) 10 MG tablet Take 10 mg by mouth daily at 2 PM.    Yes [provider]  cetirizine (ZYRTEC) 10 MG tablet Take by mouth.   Yes [provider]  insulin aspart (NOVOLOG) 100 UNIT/ML FlexPen Inject 8 Units into the skin 3 (three) times daily with meals. 07/04/19  Yes Elodia Florence., MD  NOVOLOG MIX 70/30 FLEXPEN (70-30) 100 UNIT/ML FlexPen Inject into the skin. 01/24/21  Yes [provider]  omeprazole (PRILOSEC) 40 MG capsule Take 40 mg by mouth daily. 12/05/20  Yes [provider]  blood glucose meter kit and supplies KIT Dispense based on patient and insurance preference. Use up to four times daily as directed. (FOR ICD-9 250.00, 250.01). 07/04/19   Elodia Florence., MD  esomeprazole (NEXIUM) 40 MG capsule Take 40 mg by mouth daily. Patient not taking: Reported on 02/25/2021 01/16/21   [provider]  famotidine (PEPCID) 20 MG tablet Take 1 tablet (20 mg total) by mouth 2 (two) times daily. Patient not  taking: Reported on 02/25/2021 10/07/20 10/07/21  Rudene Re, MD  hydrOXYzine (VISTARIL) 25 MG capsule Take 25 mg by mouth 3 (three) times daily as needed. 01/21/21   [provider]  insulin degludec (TRESIBA) 100 UNIT/ML FlexTouch Pen Inject into the skin. Patient not taking: Reported on 02/25/2021 02/03/16   [provider]  Insulin Pen Needle 32G X 4 MM MISC Use with insulin 07/04/19   Elodia Florence., MD  metoCLOPramide (REGLAN) 10 MG tablet Take 1 tablet (10 mg total) by mouth every 8 (eight) hours as needed for up to 3 days for nausea. 10/07/20 10/10/20  Rudene Re, MD  pantoprazole (PROTONIX) 20 MG tablet Take 1 tablet (20 mg total) by mouth daily for 14 days. 10/21/20 11/04/20  Vanessa Thurmont, MD    Allergies as of 01/29/2021   (No Known Allergies)    Family History  Problem Relation Age of Onset   Hypertension Other     Social History   Socioeconomic History   Marital status: Single    Spouse name: Not on file   Number of children: Not on file   Years of education: Not on file   Highest education level: Not on file  Occupational History   Not on file  Tobacco Use   Smoking status: Never   Smokeless tobacco: Never  Vaping Use   Vaping Use: Never used  Substance and Sexual Activity   Alcohol use: No   Drug use: No   Sexual activity: Not  on file  Other Topics Concern   Not on file  Social History Narrative   Not on file   Social Determinants of Health   Financial Resource Strain: Not on file  Food Insecurity: Not on file  Transportation Needs: Not on file  Physical Activity: Not on file  Stress: Not on file  Social Connections: Not on file  Intimate Partner Violence: Not on file    Review of Systems: See HPI, otherwise negative ROS  Physical Exam: BP 129/88   Pulse 89   Temp 97.6 F (36.4 C) (Temporal)   Resp 16   Ht 5' 6"  (1.676 m)   Wt 78.9 kg   SpO2 98%   BMI 28.08 kg/m  General:   Alert,  pleasant and  cooperative in NAD Head:  Normocephalic and atraumatic. Neck:  Supple; no masses or thyromegaly. Lungs:  Clear throughout to auscultation.    Heart:  Regular rate and rhythm. Abdomen:  Soft, nontender and nondistended. Normal bowel sounds, without guarding, and without rebound.   Neurologic:  Alert and  oriented x4;  grossly normal neurologically.  Impression/Plan: Thomas Sea. is here for an endoscopy to be performed for gerd, dysphagia  Risks, benefits, limitations, and alternatives regarding  endoscopy have been reviewed with the patient.  Questions have been answered.  All parties agreeable.   Sherri Sear, MD  02/25/2021, 8:13 AM

## 2021-02-25 NOTE — Transfer of Care (Signed)
Immediate Anesthesia Transfer of Care Note  Patient: Guadalupe Nickless.  Procedure(s) Performed: ESOPHAGOGASTRODUODENOSCOPY (EGD) WITH PROPOFOL  Patient Location: PACU  Anesthesia Type:General  Level of Consciousness: drowsy  Airway & Oxygen Therapy: Patient Spontanous Breathing and Patient connected to face mask oxygen  Post-op Assessment: Report given to RN and Post -op Vital signs reviewed and stable  Post vital signs: Reviewed and stable  Last Vitals:  Vitals Value Taken Time  BP 116/75 02/25/21 0829  Temp    Pulse 78 02/25/21 0829  Resp 15 02/25/21 0829  SpO2 100 % 02/25/21 0829  Vitals shown include unvalidated device data.  Last Pain:  Vitals:   02/25/21 0702  TempSrc: Temporal  PainSc: 0-No pain         Complications: No notable events documented.

## 2021-02-25 NOTE — Anesthesia Postprocedure Evaluation (Signed)
Anesthesia Post Note  Patient: Thomas Maddox.  Procedure(s) Performed: ESOPHAGOGASTRODUODENOSCOPY (EGD) WITH PROPOFOL  Patient location during evaluation: Endoscopy Anesthesia Type: General Level of consciousness: awake and alert Pain management: pain level controlled Vital Signs Assessment: post-procedure vital signs reviewed and stable Respiratory status: spontaneous breathing, nonlabored ventilation, respiratory function stable and patient connected to nasal cannula oxygen Cardiovascular status: blood pressure returned to baseline and stable Postop Assessment: no apparent nausea or vomiting Anesthetic complications: no   No notable events documented.   Last Vitals:  Vitals:   02/25/21 0840 02/25/21 0850  BP: 106/78 106/78  Pulse: 97 88  Resp: (!) 22 19  Temp:    SpO2: 98% 97%    Last Pain:  Vitals:   02/25/21 0820  TempSrc: Temporal  PainSc:                  Corinda Gubler

## 2021-02-26 ENCOUNTER — Encounter: Payer: Self-pay | Admitting: Gastroenterology

## 2021-02-26 LAB — SURGICAL PATHOLOGY

## 2021-02-27 ENCOUNTER — Telehealth: Payer: Self-pay

## 2021-02-27 ENCOUNTER — Other Ambulatory Visit: Payer: Self-pay

## 2021-02-27 NOTE — Telephone Encounter (Signed)
-----   Message from Toney Reil, MD sent at 02/26/2021  5:37 PM EST ----- Hope  Please inform patient that the pathology results from recent upper endoscopy suggest that he has reflux esophagitis.  He should continue taking omeprazole 40 mg twice daily for next 3 months  Rohini Vanga

## 2021-02-27 NOTE — Telephone Encounter (Signed)
Called patient he understands his results and will continue his meds as directed

## 2021-02-27 NOTE — Telephone Encounter (Signed)
lvm

## 2021-03-03 ENCOUNTER — Other Ambulatory Visit: Payer: Self-pay

## 2021-03-03 ENCOUNTER — Emergency Department: Payer: Medicaid Other

## 2021-03-03 ENCOUNTER — Emergency Department
Admission: EM | Admit: 2021-03-03 | Discharge: 2021-03-03 | Disposition: A | Discharge status: Home / Self Care | Payer: Medicaid Other | Attending: Emergency Medicine | Admitting: Emergency Medicine

## 2021-03-03 DIAGNOSIS — R251 Tremor, unspecified: Secondary | ICD-10-CM | POA: Insufficient documentation

## 2021-03-03 DIAGNOSIS — R0602 Shortness of breath: Secondary | ICD-10-CM | POA: Diagnosis present

## 2021-03-03 DIAGNOSIS — Z5321 Procedure and treatment not carried out due to patient leaving prior to being seen by health care provider: Secondary | ICD-10-CM | POA: Diagnosis not present

## 2021-03-03 DIAGNOSIS — R42 Dizziness and giddiness: Secondary | ICD-10-CM | POA: Insufficient documentation

## 2021-03-03 LAB — CBC WITH DIFFERENTIAL/PLATELET
Abs Immature Granulocytes: 0.02 10*3/uL (ref 0.00–0.07)
Basophils Absolute: 0.1 10*3/uL (ref 0.0–0.1)
Basophils Relative: 1 %
Eosinophils Absolute: 0.1 10*3/uL (ref 0.0–0.5)
Eosinophils Relative: 2 %
HCT: 44.1 % (ref 39.0–52.0)
Hemoglobin: 14.6 g/dL (ref 13.0–17.0)
Immature Granulocytes: 0 %
Lymphocytes Relative: 44 %
Lymphs Abs: 3.5 10*3/uL (ref 0.7–4.0)
MCH: 26.8 pg (ref 26.0–34.0)
MCHC: 33.1 g/dL (ref 30.0–36.0)
MCV: 80.9 fL (ref 80.0–100.0)
Monocytes Absolute: 1 10*3/uL (ref 0.1–1.0)
Monocytes Relative: 13 %
Neutro Abs: 3.1 10*3/uL (ref 1.7–7.7)
Neutrophils Relative %: 40 %
Platelets: 221 10*3/uL (ref 150–400)
RBC: 5.45 MIL/uL (ref 4.22–5.81)
RDW: 13.1 % (ref 11.5–15.5)
WBC: 7.8 10*3/uL (ref 4.0–10.5)
nRBC: 0 % (ref 0.0–0.2)

## 2021-03-03 LAB — LIPASE, BLOOD: Lipase: 21 U/L (ref 11–51)

## 2021-03-03 LAB — BASIC METABOLIC PANEL
Anion gap: 8 (ref 5–15)
BUN: 10 mg/dL (ref 6–20)
CO2: 20 mmol/L — ABNORMAL LOW (ref 22–32)
Calcium: 8.8 mg/dL — ABNORMAL LOW (ref 8.9–10.3)
Chloride: 110 mmol/L (ref 98–111)
Creatinine, Ser: 0.83 mg/dL (ref 0.61–1.24)
GFR, Estimated: >60 mL/min (ref >60)
Glucose, Bld: 156 mg/dL — ABNORMAL HIGH (ref 70–99)
Potassium: 4.3 mmol/L (ref 3.5–5.1)
Sodium: 138 mmol/L (ref 135–145)

## 2021-03-03 LAB — TROPONIN I (HIGH SENSITIVITY): Troponin I (High Sensitivity): 3 ng/L (ref <18)

## 2021-03-03 LAB — CBG MONITORING, ED: Glucose-Capillary: 149 mg/dL — ABNORMAL HIGH (ref 70–99)

## 2021-03-03 IMAGING — CR DG CHEST 2V
2 series · 2 of 2 positions shown · non-contrast
Comparison: [DATE]

CLINICAL DATA: Shortness of breath

EXAM:
CHEST - 2 VIEW

[chest pa]
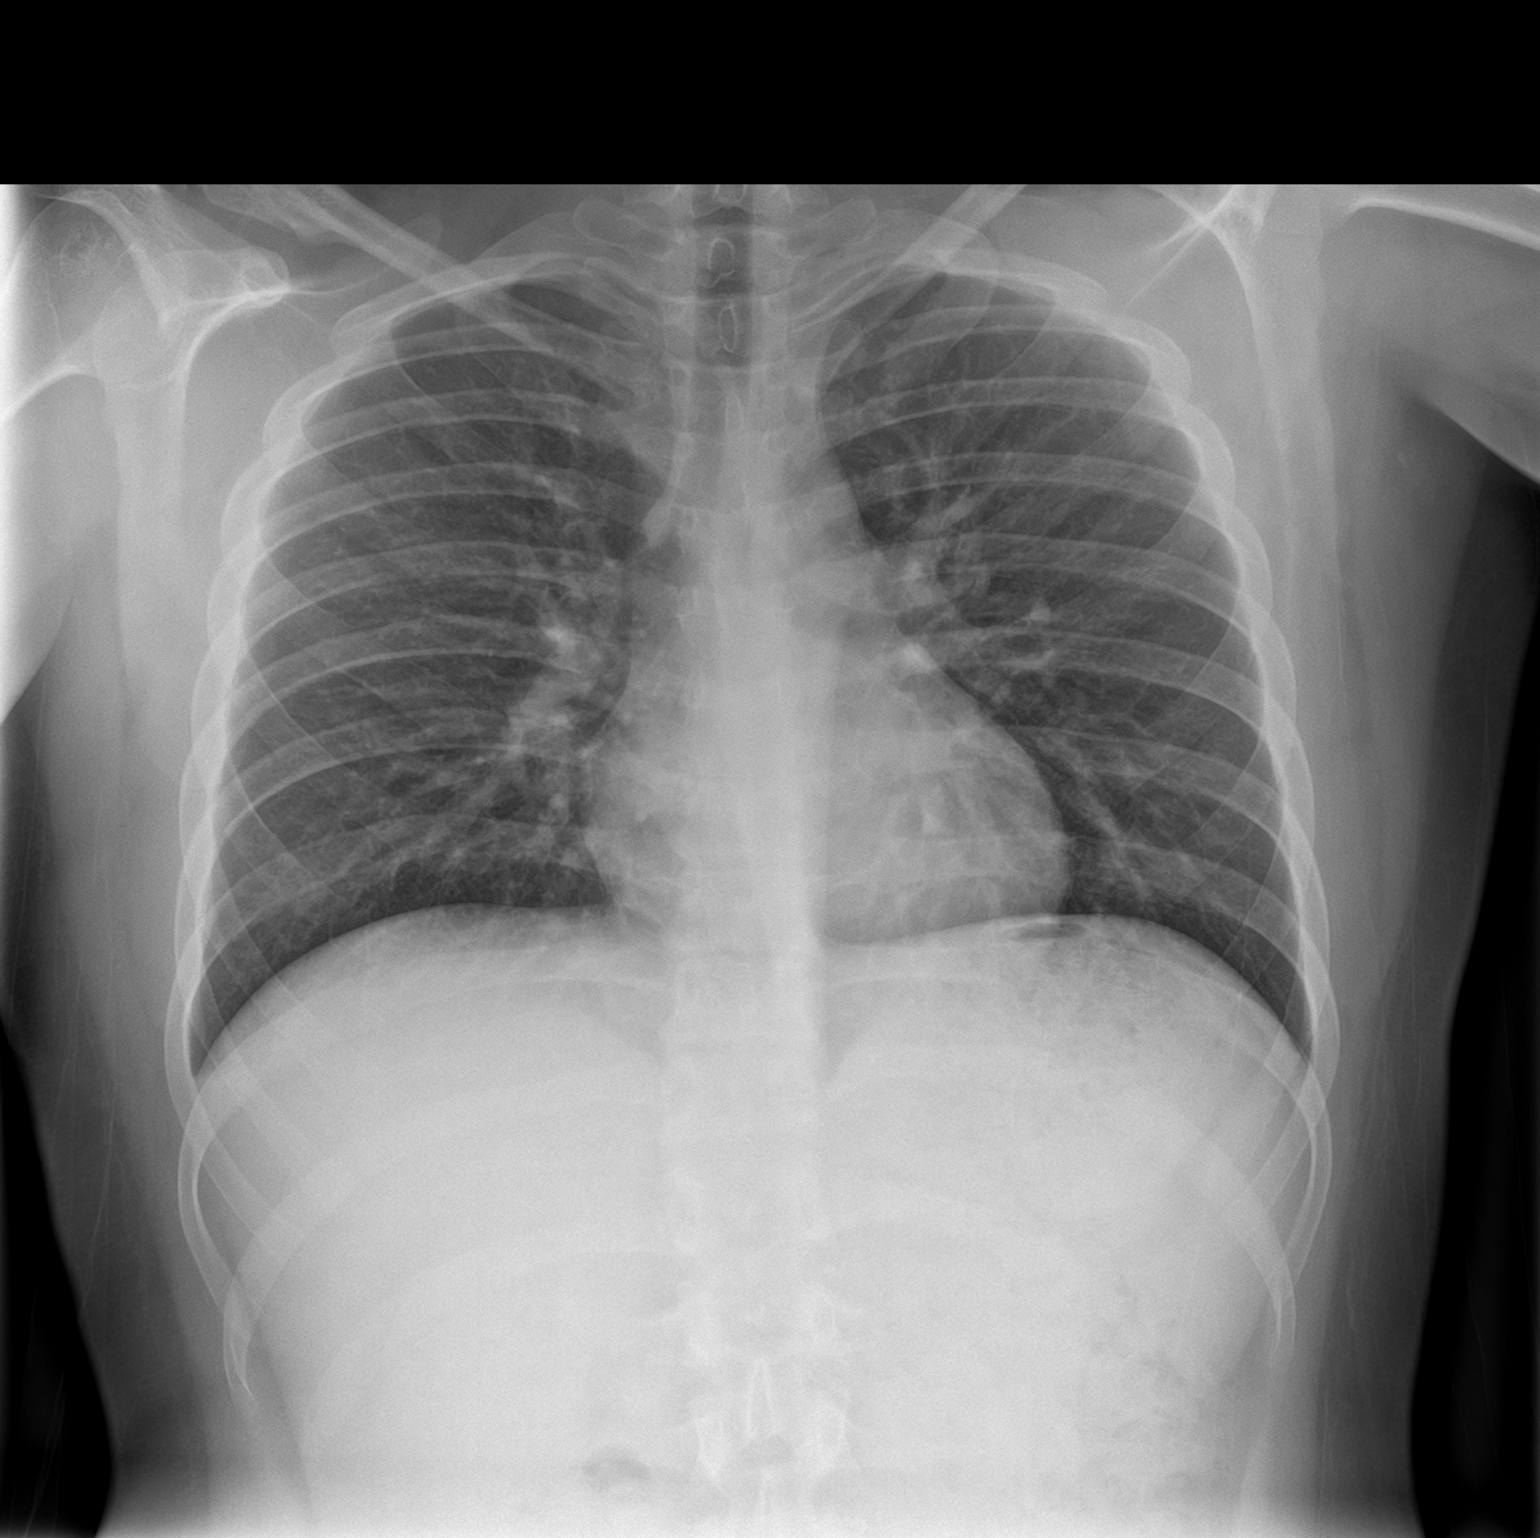

[chest lat]
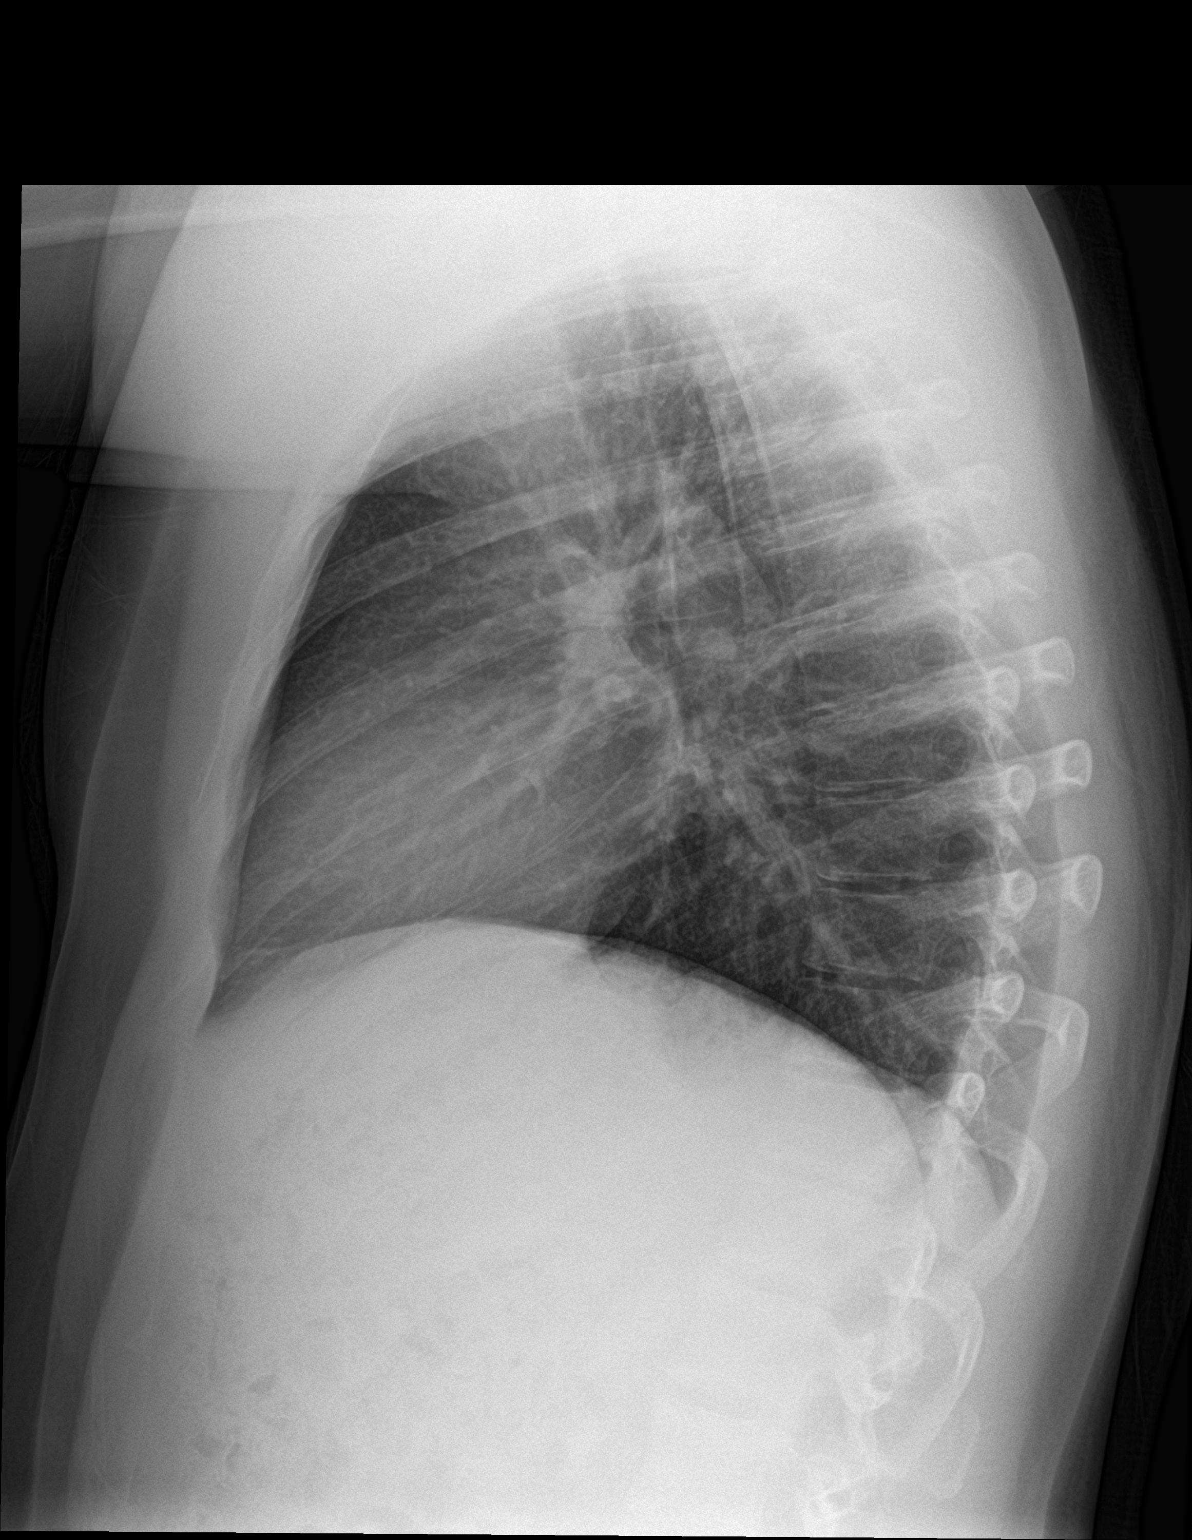

[2 of 2 positions shown; findings below may reference images not displayed]

FINDINGS: Lungs are clear.  No pleural effusion or pneumothorax.

The heart is normal in size.

Visualized osseous structures are within normal limits.
IMPRESSION: Normal chest radiographs.

## 2021-03-03 NOTE — ED Triage Notes (Signed)
Pt presents to ER c/o sob, light headed, and dizziness that started yesterday after eating dinner.  Pt is also noted to be shaking and states he does not know why.  Pt A&O x4 at this time.  NAD noted.

## 2021-06-03 ENCOUNTER — Ambulatory Visit: Payer: Medicaid Other | Admitting: Gastroenterology

## 2021-07-30 ENCOUNTER — Other Ambulatory Visit: Payer: Self-pay

## 2021-07-30 ENCOUNTER — Emergency Department: Payer: Medicaid Other

## 2021-07-30 ENCOUNTER — Emergency Department
Admission: EM | Admit: 2021-07-30 | Discharge: 2021-07-30 | Disposition: A | Discharge status: Home / Self Care | Payer: Medicaid Other | Attending: Student in an Organized Health Care Education/Training Program | Admitting: Student in an Organized Health Care Education/Training Program

## 2021-07-30 ENCOUNTER — Encounter: Payer: Self-pay | Admitting: Emergency Medicine

## 2021-07-30 DIAGNOSIS — R1011 Right upper quadrant pain: Secondary | ICD-10-CM | POA: Diagnosis present

## 2021-07-30 DIAGNOSIS — E109 Type 1 diabetes mellitus without complications: Secondary | ICD-10-CM | POA: Insufficient documentation

## 2021-07-30 DIAGNOSIS — R1013 Epigastric pain: Secondary | ICD-10-CM | POA: Diagnosis not present

## 2021-07-30 LAB — CBC
HCT: 46.1 % (ref 39.0–52.0)
Hemoglobin: 15.2 g/dL (ref 13.0–17.0)
MCH: 26.6 pg (ref 26.0–34.0)
MCHC: 33 g/dL (ref 30.0–36.0)
MCV: 80.7 fL (ref 80.0–100.0)
Platelets: 216 10*3/uL (ref 150–400)
RBC: 5.71 MIL/uL (ref 4.22–5.81)
RDW: 12.7 % (ref 11.5–15.5)
WBC: 5.2 10*3/uL (ref 4.0–10.5)
nRBC: 0 % (ref 0.0–0.2)

## 2021-07-30 LAB — COMPREHENSIVE METABOLIC PANEL
ALT: 18 U/L (ref 0–44)
AST: 19 U/L (ref 15–41)
Albumin: 4.3 g/dL (ref 3.5–5.0)
Alkaline Phosphatase: 61 U/L (ref 38–126)
Anion gap: 9 (ref 5–15)
BUN: 15 mg/dL (ref 6–20)
CO2: 25 mmol/L (ref 22–32)
Calcium: 9 mg/dL (ref 8.9–10.3)
Chloride: 101 mmol/L (ref 98–111)
Creatinine, Ser: 0.89 mg/dL (ref 0.61–1.24)
GFR, Estimated: >60 mL/min (ref >60)
Glucose, Bld: 212 mg/dL — ABNORMAL HIGH (ref 70–99)
Potassium: 4.1 mmol/L (ref 3.5–5.1)
Sodium: 135 mmol/L (ref 135–145)
Total Bilirubin: 0.9 mg/dL (ref 0.3–1.2)
Total Protein: 8.4 g/dL — ABNORMAL HIGH (ref 6.5–8.1)

## 2021-07-30 LAB — LIPASE, BLOOD: Lipase: 22 U/L (ref 11–51)

## 2021-07-30 IMAGING — US US ABDOMEN LIMITED
1 series · 14 of 25 positions shown · non-contrast
Comparison: None.

CLINICAL DATA: Right upper quadrant pain

EXAM:
ULTRASOUND ABDOMEN LIMITED RIGHT UPPER QUADRANT

[Series 1: us abdomen limited ruq (liver/gb) · 14 of 39 slices shown]
[im 1/39]
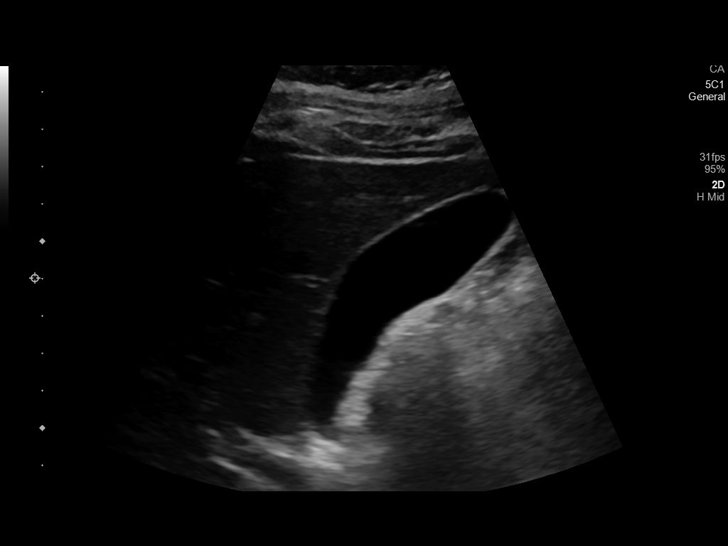
[im 4/39]
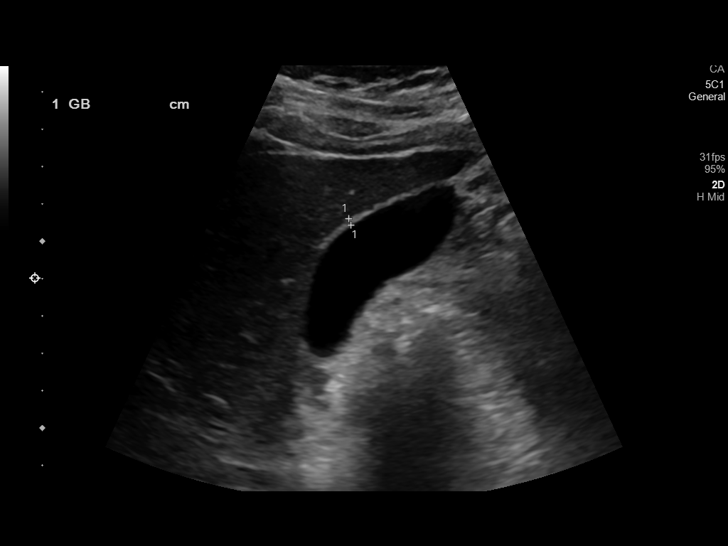
[im 7/39]
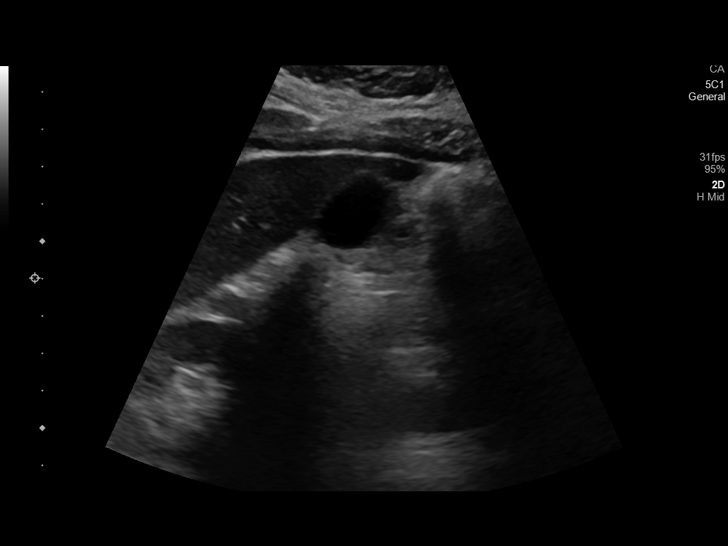
[im 10/39]
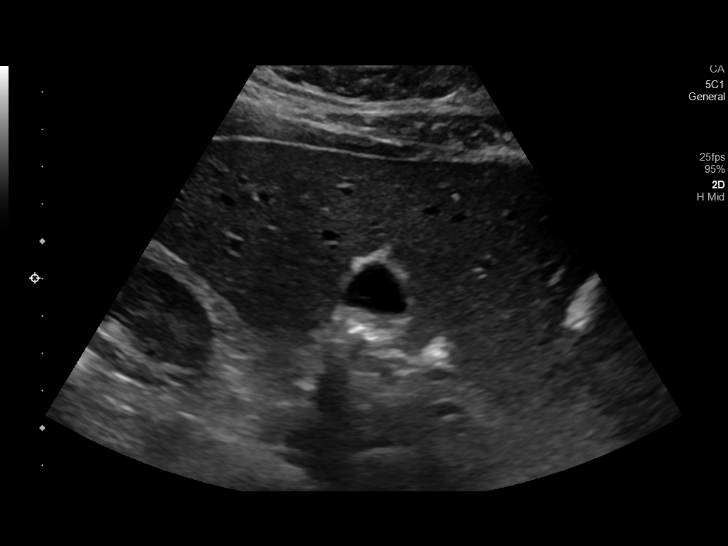
[im 13/39]
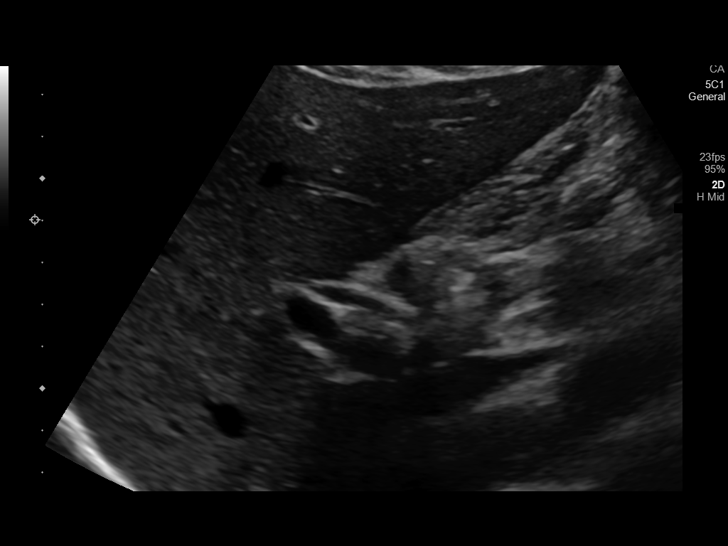
[im 15/39]
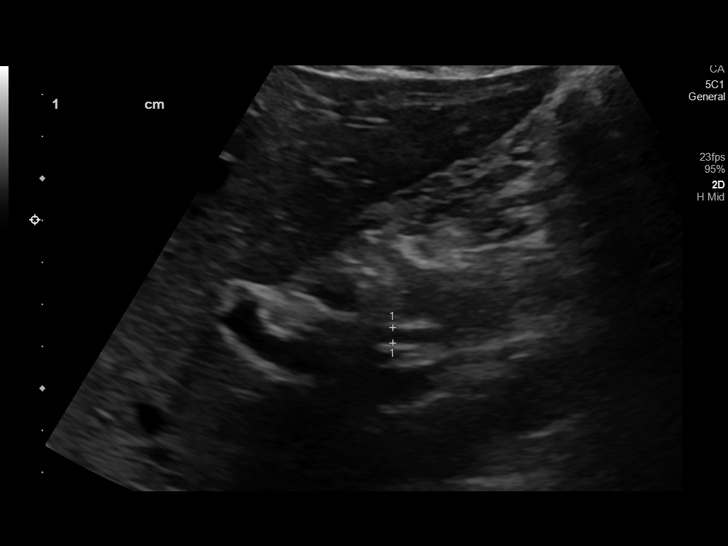
[im 18/39]
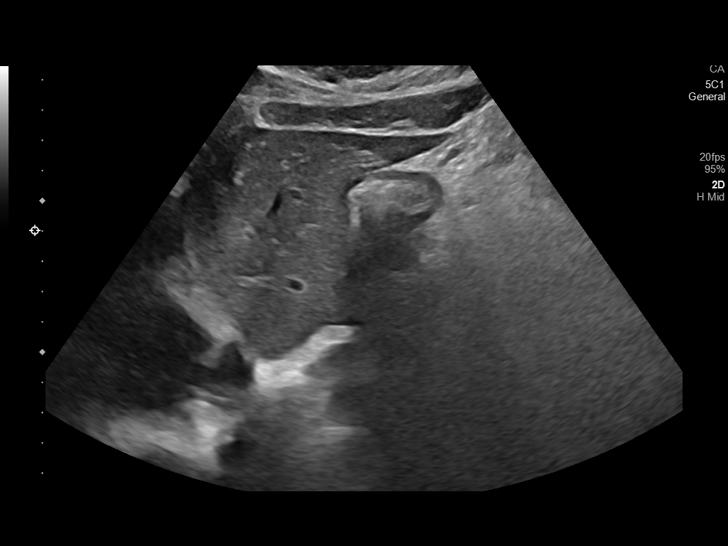
[im 21/39]
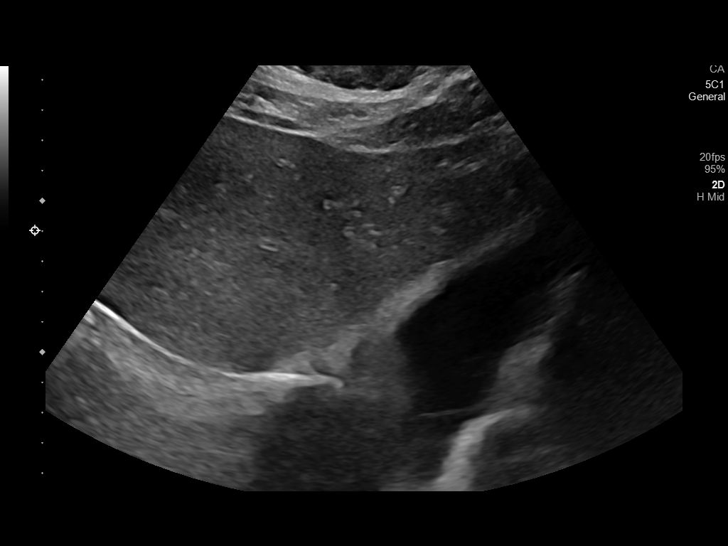
[im 24/39]
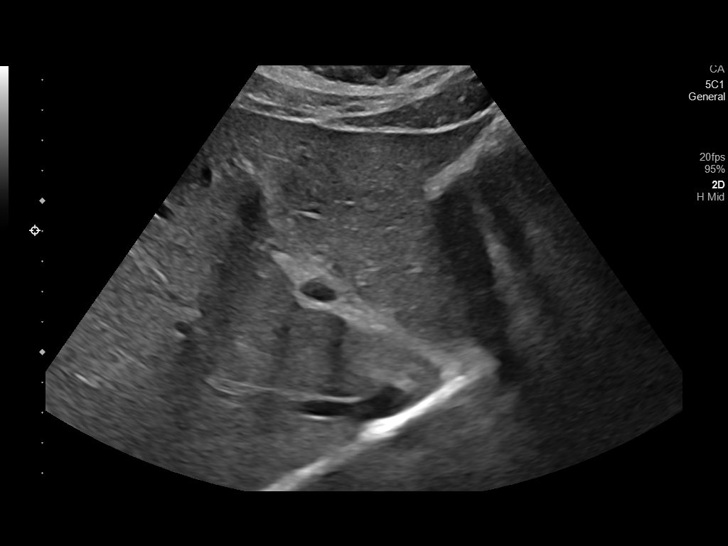
[im 26/39]
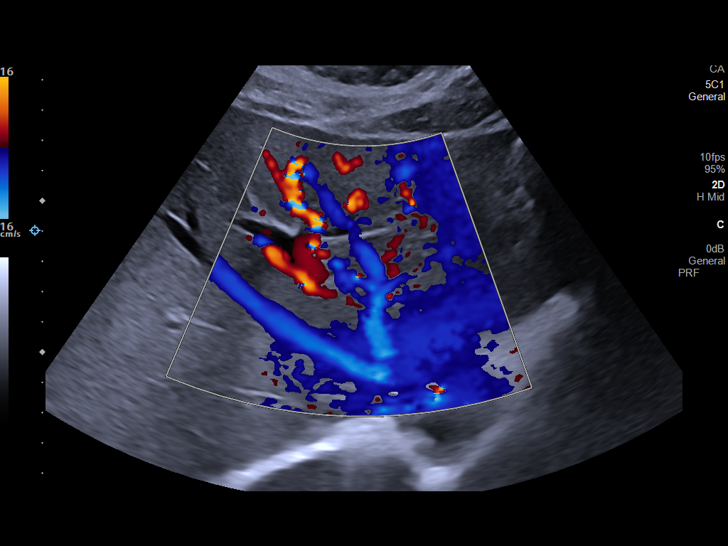
[im 29/39]
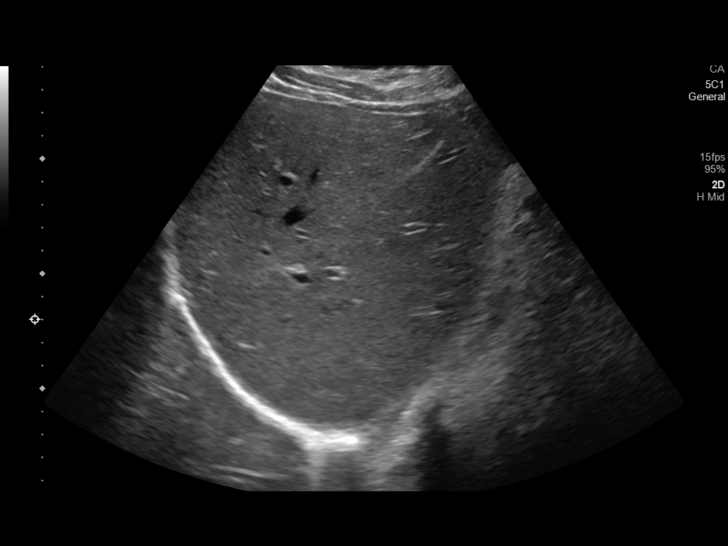
[im 32/39]
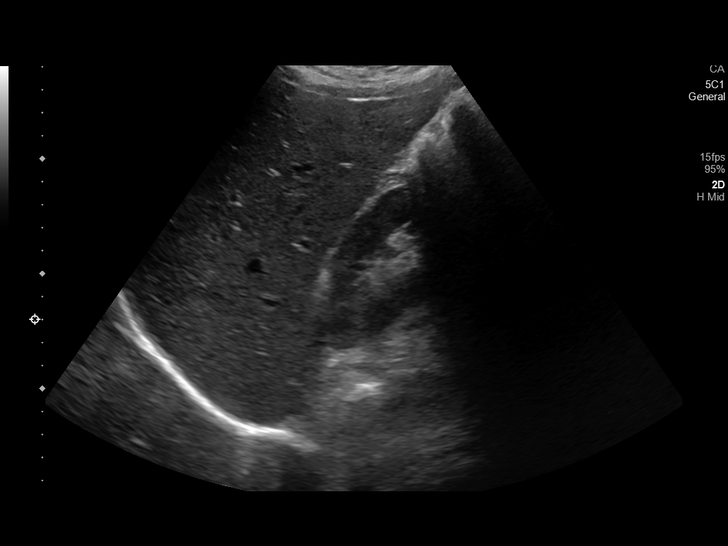
[im 35/39]
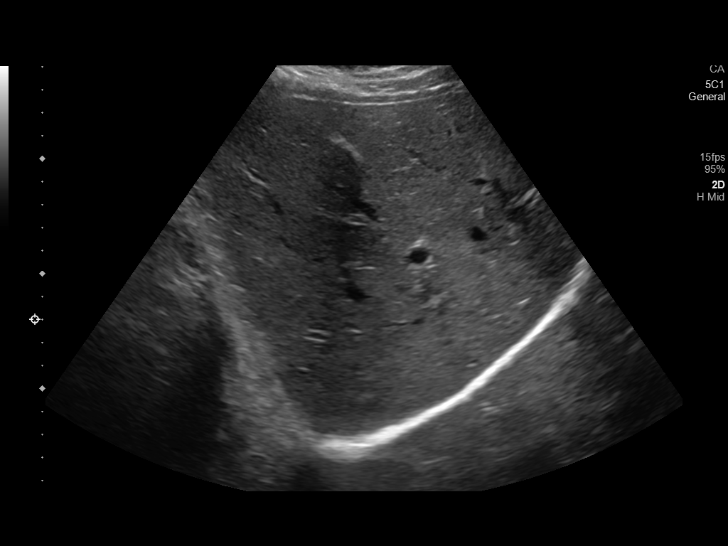
[im 39/39]
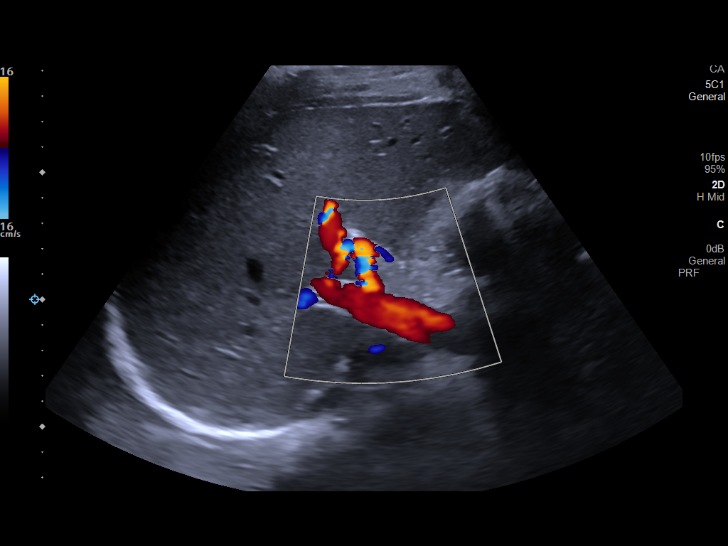

[14 of 25 positions shown; findings below may reference images not displayed]

FINDINGS: Gallbladder:

No gallstones or wall thickening visualized. No sonographic Murphy
sign noted by sonographer.

Common bile duct:

Diameter: 4 mm, normal

Liver:

No focal lesion identified. Within normal limits in parenchymal
echogenicity. Portal vein is patent on color Doppler imaging with
normal direction of blood flow towards the liver.

Other: None.
IMPRESSION: Unremarkable right upper quadrant ultrasound.

## 2021-07-30 MED ORDER — ALUM & MAG HYDROXIDE-SIMETH 200-200-20 MG/5ML PO SUSP
30.0000 mL | Freq: Once | ORAL | Status: DC
  Filled 2021-07-30: qty 30

## 2021-07-30 MED ORDER — PROMETHAZINE HCL 25 MG PO TABS: 25.0000 mg | ORAL_TABLET | Freq: Four times a day (QID) | ORAL | 0 refills | Status: DC | PRN

## 2021-07-30 MED ORDER — LIDOCAINE VISCOUS HCL 2 % MT SOLN
15.0000 mL | Freq: Once | OROMUCOSAL | Status: DC
  Filled 2021-07-30: qty 15

## 2021-07-30 NOTE — Discharge Instructions (Signed)

## 2021-07-30 NOTE — Progress Notes (Signed)
Inpatient Diabetes Program Recommendations ? ?AACE/ADA: New Consensus Statement on Inpatient Glycemic Control (2015) ? ?Target Ranges:  Prepandial:   less than 140 mg/dL ?     Peak postprandial:   less than 180 mg/dL (1-2 hours) ?     Critically ill patients:  140 - 180 mg/dL  ? ?Lab Results  ?Component Value Date  ? GLUCAP 149 (H) 03/03/2021  ? HGBA1C 11.8 (H) 07/04/2019  ? ? ?Review of Glycemic Control ? ?Diabetes history: DM1 ?Outpatient Diabetes medications: 70/30 32 units ac breakfast , 31 units ac dinner ? ?Inpatient Diabetes Program Recommendations:   ?Patient dx'd with type 1 diabetes 15 years ago. ?Noted patient currently in Triage with epigastric pain. ?Spoke with patient to clarify what insulin he is currently using. ?Patient is taking  70/30 32 units ac breakfast ,31 units ac dinner. ?DM coordinator spoke with patient on admission 07/04/19 regarding diabetes management. During this admission, patient planned to followup with a local endocrinologist, change back to [place] + Humalog regimen. Reviewed prior plan with patient. ? ?Patient states he just called an endocrinologist's office in [place], but no appointments available for 2 months. Patient never changed regimen of insulin and stayed on 70/30 regimen. ?States he takes his 70/30 regimen regularly bid and checks CBGs with his meter but sometimes not regularly, depending on if he feels low. ?Reviewed with patient to check CBG along with symptoms. ?Patient picked up 2 dexcom sensors from the pharmacy which patient states should have been 9 sensors, so does not have continuous glucose monitor.  ? ?Thank you, ?Thomas Maddox , RN, MSN, CDE  ?Diabetes Coordinator ?Inpatient Glycemic Control Team ?Team Pager [phone] (8am-5pm) ?07/30/2021 1:04 PM ? ? ? ? ?

## 2021-07-30 NOTE — ED Provider Notes (Signed)
? ?[facility] ?Provider Note ? ? ? Event Date/Time  ? First MD Initiated Contact with Patient 07/30/21 1222   ?  (approximate) ? ? ?History  ? ?Abdominal Pain ? ? ?HPI ? ?Thomas Maddox. is a 21 y.o. male history of type 1 diabetes gastritis esophagitis and recurrent abdominal pain presents to the ER for evaluation of burning epigastric discomfort decreased p.o. intake despite taking his PPI medications and GI cocktails at home.  Symptoms been ongoing for several days.  No fevers or chills no pain radiating through to his back.  Still moving his bowels.  Denies any dysuria.  Denies any chest pain or pressure no shortness of breath.  Called who is outpatient doctor who office and was told that he may need ultrasound right upper quadrant to rule out gallstones. ?  ? ? ?Physical Exam  ? ?Triage Vital Signs: ?ED Triage Vitals  ?Enc Vitals Group  ?   BP 07/30/21 1206 131/88  ?   Pulse Rate 07/30/21 1206 97  ?   Resp 07/30/21 1206 15  ?   Temp 07/30/21 1206 98.8 ?F (37.1 ?C)  ?   Temp Source 07/30/21 1206 Oral  ?   SpO2 07/30/21 1206 99 %  ?   Weight 07/30/21 1207 170 lb (77.1 kg)  ?   Height 07/30/21 1207 5\' 6"  (1.676 m)  ?   Head Circumference --   ?   Peak Flow --   ?   Pain Score 07/30/21 1207 10  ?   Pain Loc --   ?   Pain Edu? --   ?   Excl. in GC? --   ? ? ?Most recent vital signs: ?Vitals:  ? 07/30/21 1206  ?BP: 131/88  ?Pulse: 97  ?Resp: 15  ?Temp: 98.8 ?F (37.1 ?C)  ?SpO2: 99%  ? ? ? ?Constitutional: Alert  ?Eyes: Conjunctivae are normal.  ?Head: Atraumatic. ?Nose: No congestion/rhinnorhea. ?Mouth/Throat: Mucous membranes are moist.   ?Neck: Painless ROM.  ?Cardiovascular:   Good peripheral circulation. ?Respiratory: Normal respiratory effort.  No retractions.  ?Gastrointestinal: Soft and nontender in all four quadrants ?Musculoskeletal:  no deformity ?Neurologic:  MAE spontaneously. No gross focal neurologic deficits are appreciated.  ?Skin:  Skin is warm, dry and intact. No rash  noted. ?Psychiatric: Mood and affect are normal. Speech and behavior are normal. ? ? ? ?ED Results / Procedures / Treatments  ? ?Labs ?(all labs ordered are listed, but only abnormal results are displayed) ?Labs Reviewed  ?COMPREHENSIVE METABOLIC PANEL - Abnormal; Notable for the following components:  ?    Result Value  ? Glucose, Bld 212 (*)   ? Total Protein 8.4 (*)   ? All other components within normal limits  ?LIPASE, BLOOD  ?CBC  ?URINALYSIS, ROUTINE W REFLEX MICROSCOPIC  ? ? ? ?EKG ? ?ED ECG REPORT ?I, 09/29/21, the attending physician, personally viewed and interpreted this ECG. ? ? Date: 07/30/2021 ? EKG Time: 12:02 ? Rate: 85 ? Rhythm: sinus ? Axis: right ? Intervals: normal ? ST&T Change: no stemi, no depressions ? ? ? ?RADIOLOGY ?Please see ED Course for my review and interpretation. ? ?I personally reviewed all radiographic images ordered to evaluate for the above acute complaints and reviewed radiology reports and findings.  These findings were personally discussed with the patient.  Please see medical record for radiology report. ? ? ? ?PROCEDURES: ? ?Critical Care performed: No ? ?Procedures ? ? ?MEDICATIONS ORDERED IN ED: ?Medications  ?  alum & mag hydroxide-simeth (MAALOX/MYLANTA) 200-200-20 MG/5ML suspension 30 mL (30 mLs Oral Patient Refused/Not Given 07/30/21 1244)  ?  And  ?lidocaine (XYLOCAINE) 2 % viscous mouth solution 15 mL (15 mLs Oral Patient Refused/Not Given 07/30/21 1245)  ? ? ? ?IMPRESSION / MDM / ASSESSMENT AND PLAN / ED COURSE  ?I reviewed the triage vital signs and the nursing notes. ?             ?               ? ?Differential diagnosis includes, but is not limited to, gastritis, enteritis, SBO, cholelithiasis, diverticulitis, colitis ? ?Patient clinically very well-appearing hemodynamically stable nontoxic.  Blood work sent for the blood differential was reassuring.  He is declining GI cocktail though suspect gastritis clinically do not feel that CT imaging clinically  indicated.  Will order ultrasound.  Its not consistent with ACS.  Not consistent with pericarditis.  Not consistent with SBO or perforation ?Clinical Course as of 07/30/21 1346  ?Wed Jul 30, 2021  ?1343 Patient reassessed.  Remains well-appearing in no acute distress.  Ultrasound reassuring.  Given his well appearance I do believe he is stable and appropriate for outpatient follow-up. [PR]  ?  ?Clinical Course User Index ?[PR] Willy Eddy, MD  ? ? ? ?FINAL CLINICAL IMPRESSION(S) / ED DIAGNOSES  ? ?Final diagnoses:  ?Epigastric pain  ? ? ? ?Rx / DC Orders  ? ?ED Discharge Orders   ? ?      Ordered  ?  promethazine (PHENERGAN) 25 MG tablet  Every 6 hours PRN       ? 07/30/21 1345  ? ?  ?  ? ?  ? ? ? ?Note:  This document was prepared using Dragon voice recognition software and may include unintentional dictation errors. ? ?  ?Willy Eddy, MD ?07/30/21 1346 ? ?

## 2021-07-30 NOTE — ED Triage Notes (Signed)
Pt via POV from home. Pt c/o epigastric pain and nausea since Thursday. Denies any vomiting. Denies fevers. Pt does have a hx of GER. Pt is A&OX4 and NAD.  ?

## 2021-07-30 NOTE — ED Notes (Signed)
See triage note presents with epigastric pain since last Thursday  states pain is worse after eating  no fever  positive nausea ?

## 2021-08-17 ENCOUNTER — Other Ambulatory Visit: Payer: Self-pay

## 2021-08-17 ENCOUNTER — Emergency Department: Payer: Medicaid Other

## 2021-08-17 ENCOUNTER — Emergency Department
Admission: EM | Admit: 2021-08-17 | Discharge: 2021-08-17 | Disposition: A | Discharge status: Home / Self Care | Payer: Medicaid Other | Attending: Emergency Medicine | Admitting: Emergency Medicine

## 2021-08-17 DIAGNOSIS — S9032XA Contusion of left foot, initial encounter: Secondary | ICD-10-CM | POA: Diagnosis not present

## 2021-08-17 DIAGNOSIS — E119 Type 2 diabetes mellitus without complications: Secondary | ICD-10-CM | POA: Insufficient documentation

## 2021-08-17 DIAGNOSIS — W228XXA Striking against or struck by other objects, initial encounter: Secondary | ICD-10-CM | POA: Insufficient documentation

## 2021-08-17 DIAGNOSIS — S99922A Unspecified injury of left foot, initial encounter: Secondary | ICD-10-CM | POA: Diagnosis present

## 2021-08-17 IMAGING — CR DG FOOT COMPLETE 3+V*L*
1 series · 3 of 3 positions shown · non-contrast
Comparison: None.

CLINICAL DATA: Left foot pain after injury.

EXAM:
LEFT FOOT - COMPLETE 3+ VIEW

[Series 1: dg foot complete left · 0.14mm/px · 3 of 3 slices shown]
[im 1/3]
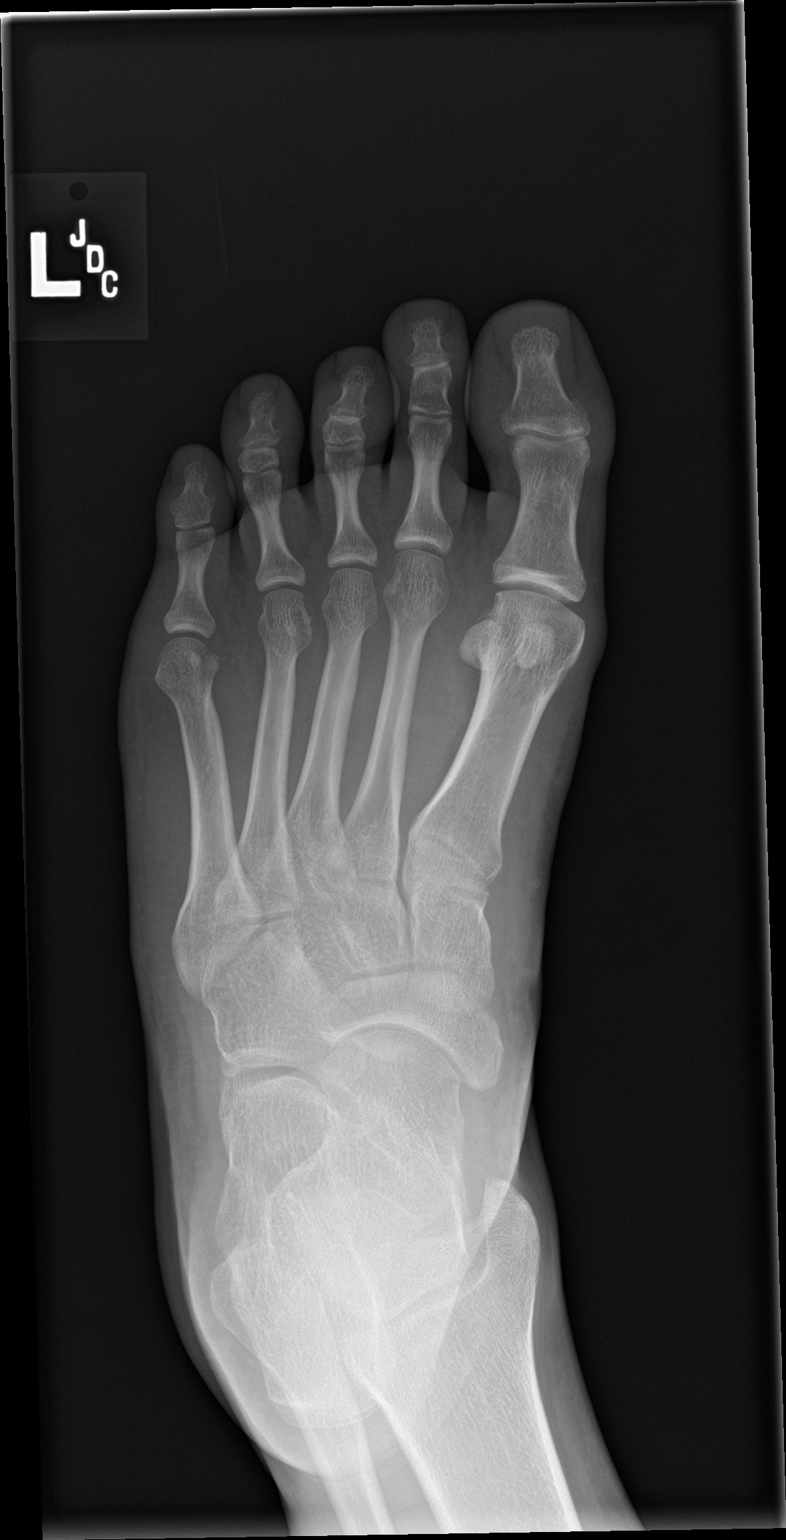
[im 2/3]
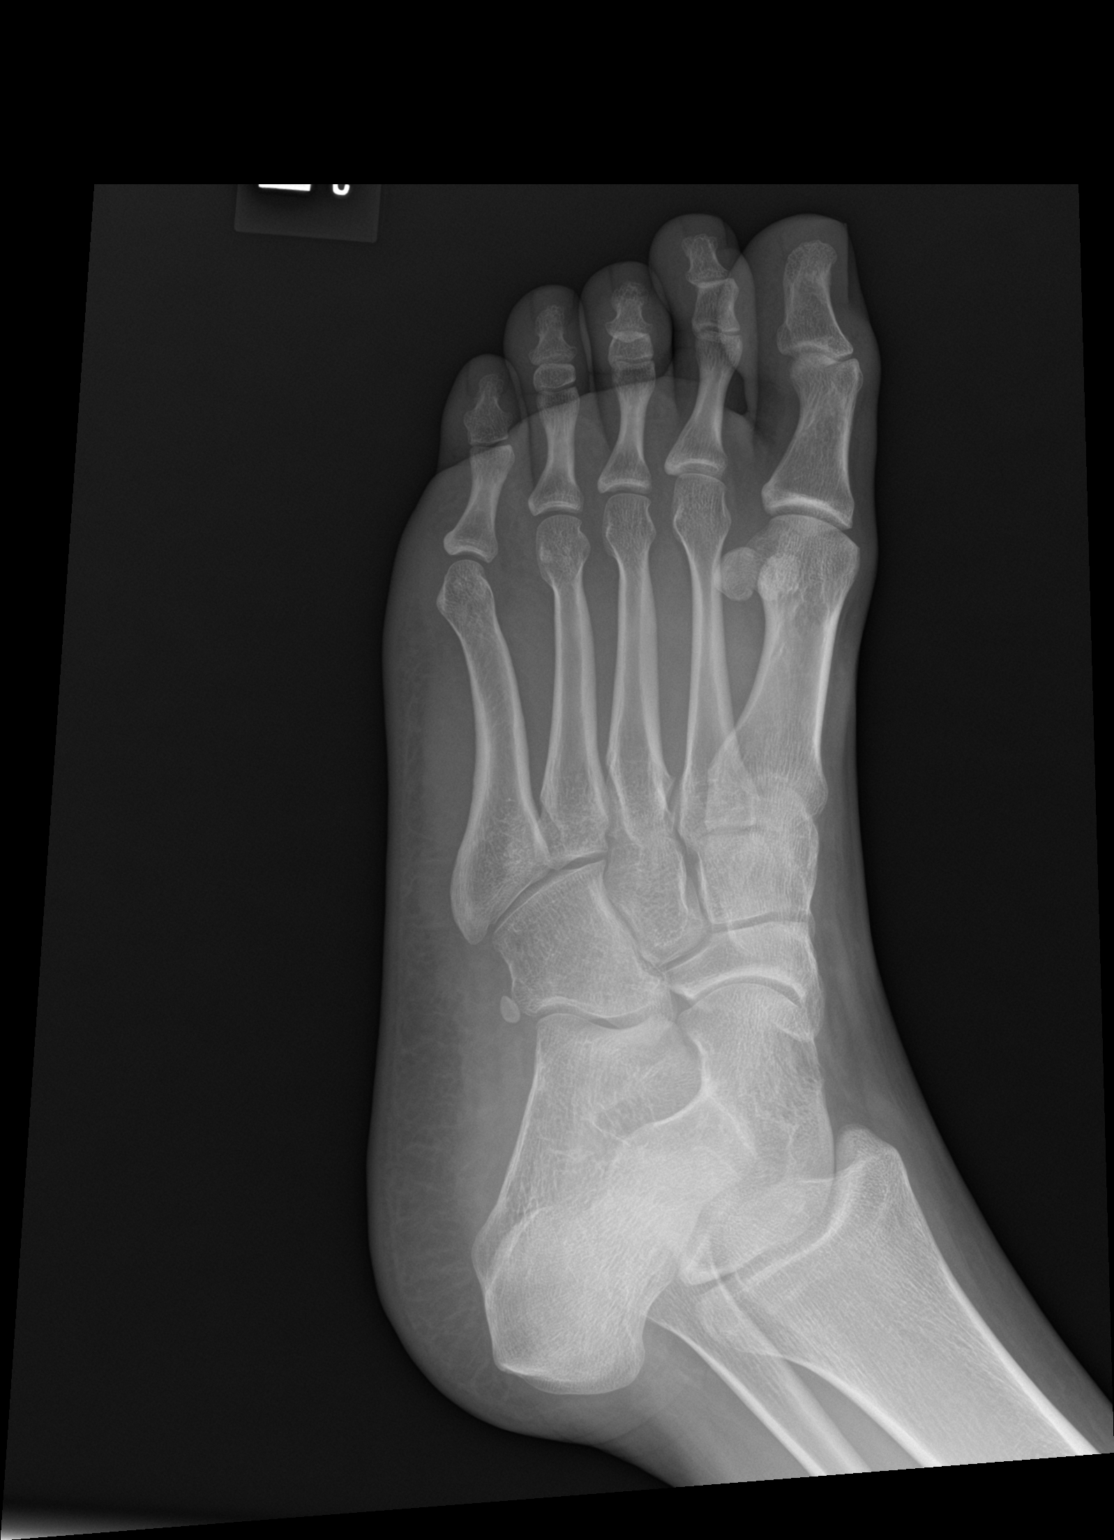
[im 3/3]
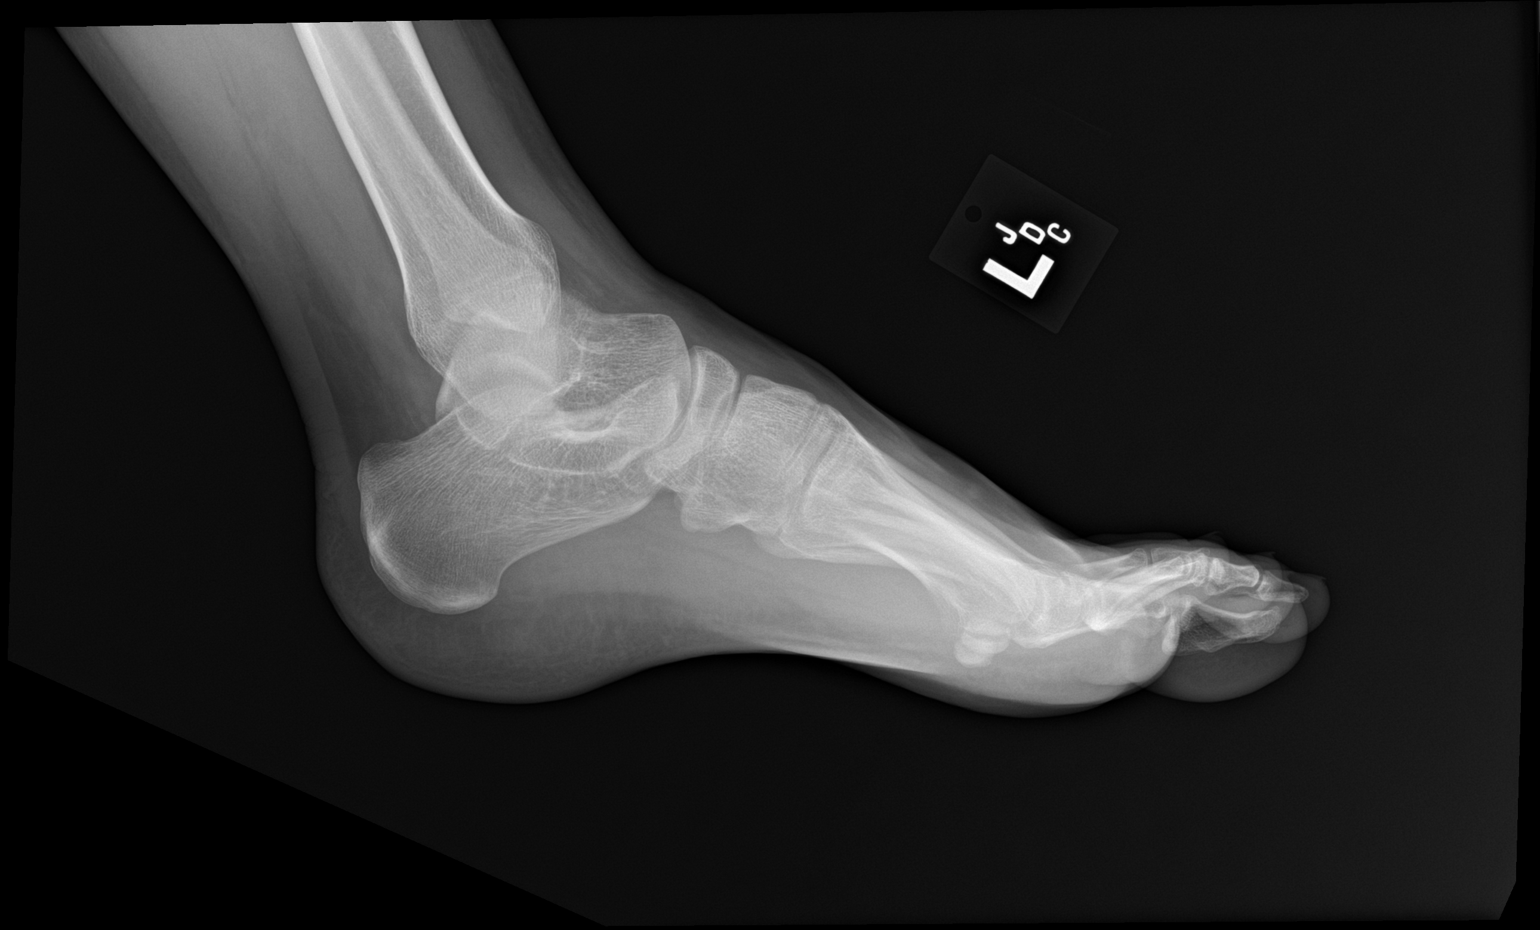

[3 of 3 positions shown; findings below may reference images not displayed]

FINDINGS: There is no evidence of fracture or dislocation. There is no
evidence of arthropathy or other focal bone abnormality. Soft
tissues are unremarkable.
IMPRESSION: Negative.

## 2021-08-17 MED ORDER — IBUPROFEN 800 MG PO TABS
800.0000 mg | ORAL_TABLET | Freq: Once | ORAL | Status: DC
  Filled 2021-08-17: qty 1

## 2021-08-17 MED ORDER — ACETAMINOPHEN 500 MG PO TABS
1000.0000 mg | ORAL_TABLET | Freq: Once | ORAL | Status: AC
  Administered 2021-08-17: 1000 mg via ORAL
  Filled 2021-08-17: qty 2
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
## 2021-09-14 ENCOUNTER — Other Ambulatory Visit: Payer: Self-pay

## 2021-09-14 ENCOUNTER — Emergency Department
Admission: EM | Admit: 2021-09-14 | Discharge: 2021-09-14 | Disposition: A | Discharge status: Home / Self Care | Payer: Medicaid Other | Attending: Emergency Medicine | Admitting: Emergency Medicine

## 2021-09-14 DIAGNOSIS — R55 Syncope and collapse: Secondary | ICD-10-CM | POA: Diagnosis not present

## 2021-09-14 DIAGNOSIS — Z794 Long term (current) use of insulin: Secondary | ICD-10-CM | POA: Insufficient documentation

## 2021-09-14 DIAGNOSIS — R42 Dizziness and giddiness: Secondary | ICD-10-CM | POA: Diagnosis present

## 2021-09-14 DIAGNOSIS — F419 Anxiety disorder, unspecified: Secondary | ICD-10-CM | POA: Insufficient documentation

## 2021-09-14 DIAGNOSIS — F909 Attention-deficit hyperactivity disorder, unspecified type: Secondary | ICD-10-CM | POA: Insufficient documentation

## 2021-09-14 DIAGNOSIS — E1165 Type 2 diabetes mellitus with hyperglycemia: Secondary | ICD-10-CM | POA: Diagnosis not present

## 2021-09-14 LAB — COMPREHENSIVE METABOLIC PANEL
ALT: 17 U/L (ref 0–44)
AST: 18 U/L (ref 15–41)
Albumin: 4.2 g/dL (ref 3.5–5.0)
Alkaline Phosphatase: 77 U/L (ref 38–126)
Anion gap: 8 (ref 5–15)
BUN: 11 mg/dL (ref 6–20)
CO2: 29 mmol/L (ref 22–32)
Calcium: 8.9 mg/dL (ref 8.9–10.3)
Chloride: 100 mmol/L (ref 98–111)
Creatinine, Ser: 0.9 mg/dL (ref 0.61–1.24)
GFR, Estimated: >60 mL/min (ref >60)
Glucose, Bld: 353 mg/dL — ABNORMAL HIGH (ref 70–99)
Potassium: 3.8 mmol/L (ref 3.5–5.1)
Sodium: 137 mmol/L (ref 135–145)
Total Bilirubin: 0.6 mg/dL (ref 0.3–1.2)
Total Protein: 8.4 g/dL — ABNORMAL HIGH (ref 6.5–8.1)

## 2021-09-14 LAB — CBC
HCT: 48.6 % (ref 39.0–52.0)
Hemoglobin: 15.7 g/dL (ref 13.0–17.0)
MCH: 26.2 pg (ref 26.0–34.0)
MCHC: 32.3 g/dL (ref 30.0–36.0)
MCV: 81.1 fL (ref 80.0–100.0)
Platelets: 222 10*3/uL (ref 150–400)
RBC: 5.99 MIL/uL — ABNORMAL HIGH (ref 4.22–5.81)
RDW: 12.7 % (ref 11.5–15.5)
WBC: 6 10*3/uL (ref 4.0–10.5)
nRBC: 0 % (ref 0.0–0.2)

## 2021-09-14 LAB — TSH: TSH: 2.996 u[IU]/mL (ref 0.350–4.500)

## 2021-09-14 LAB — TROPONIN I (HIGH SENSITIVITY): Troponin I (High Sensitivity): <2 ng/L (ref <18)

## 2021-09-14 LAB — CBG MONITORING, ED: Glucose-Capillary: 351 mg/dL — ABNORMAL HIGH (ref 70–99)

## 2021-09-14 MED ORDER — HYDROXYZINE HCL 25 MG PO TABS: 25.0000 mg | ORAL_TABLET | Freq: Three times a day (TID) | ORAL | 0 refills | Status: AC | PRN

## 2021-09-14 MED ORDER — HYDROXYZINE HCL 50 MG PO TABS
50.0000 mg | ORAL_TABLET | Freq: Once | ORAL | Status: DC
  Filled 2021-09-14: qty 1

## 2021-09-14 MED ORDER — SODIUM CHLORIDE 0.9 % IV BOLUS
1000.0000 mL | Freq: Once | INTRAVENOUS | Status: AC
  Administered 2021-09-14: 1000 mL via INTRAVENOUS

## 2021-09-14 NOTE — ED Provider Notes (Signed)
[facility] Provider Note    Event Date/Time   First MD Initiated Contact with Patient 09/14/21 2152     (approximate)  History   Chief Complaint: Near Syncope  HPI  Thomas Maddox. is a 21 y.o. male with a past medical history of diabetes, ADHD, presents to the emergency department with multiple complaints.  According to the patient over the past several weeks he has been experiencing symptoms where he feels a rush coming over his body, states he feels dizzy or lightheaded at times like he is going to pass out.  Patient states today he had similar symptoms but felt like they were more intense we came to the emergency department for evaluation.  Patient states he is diabetic and just ate prior to arrival but does his own insulin prior to arrival as well.  Patient denies any chest pain.  Patient appears fairly anxious during my evaluation.  Patient states he has been told in the past that this is likely anxiety.  Physical Exam   Triage Vital Signs: ED Triage Vitals  Enc Vitals Group     BP 09/14/21 2138 (!) 148/99     Pulse Rate 09/14/21 2138 (!) 103     Resp 09/14/21 2138 16     Temp 09/14/21 2138 98.6 F (37 C)     Temp Source 09/14/21 2138 Oral     SpO2 09/14/21 2138 98 %     Weight 09/14/21 2139 160 lb (72.6 kg)     Height 09/14/21 2139 5\' 7"  (1.702 m)     Head Circumference --      Peak Flow --      Pain Score 09/14/21 2139 0     Pain Loc --      Pain Edu? --      Excl. in [place]? --     Most recent vital signs: Vitals:   09/14/21 2138  BP: (!) 148/99  Pulse: (!) 103  Resp: 16  Temp: 98.6 F (37 C)  SpO2: 98%    General: Awake, no distress.  Mildly anxious appearing. CV:  Good peripheral perfusion.  Regular rate and rhythm  Resp:  Normal effort.  Equal breath sounds bilaterally.  Abd:  No distention.  Soft, nontender.  No rebound or guarding.    ED Results / Procedures / Treatments   MEDICATIONS ORDERED IN ED: Medications   sodium chloride 0.9 % bolus 1,000 mL (has no administration in time range)     IMPRESSION / MDM / ASSESSMENT AND PLAN / ED COURSE  I reviewed the triage vital signs and the nursing notes.  Patient presents to the emergency department for near syncope symptoms.  States at times he will feel lightheaded or dizzy or feel like something rush or flushed over his body.  Patient is noted to be hyperglycemic tonight 351 but states he just ate prior to arrival and doses on insulin prior to arrival.  Patient denies any chest pain.  No fever.  Largely negative review of systems otherwise.  We will check labs including a troponin as well as a TSH.  Given the patient's hyperglycemia we will check a chemistry, dose IV fluids.  Given the patient's anxiety type symptoms we will also dose 50 mg of hydroxyzine while awaiting results.  Patient agreeable to plan of care.  Patient's work-up is overall reassuring.  Patient's CBC is normal.  Chemistry shows continued hyperglycemia however patient states he took insulin prior to arrival and patient  has now received a liter of normal saline as well, anion gap is normal.  TSH is normal.  Troponin negative.  Highly suspect anxiety could be causing patient's symptoms.  We will discharge with short course of hydroxyzine and have the patient follow-up with his doctor.  Patient agreeable to plan of care.  FINAL CLINICAL IMPRESSION(S) / ED DIAGNOSES   Near syncope Anxiety  Rx / DC Orders   Hydroxyzine  Note:  This document was prepared using Dragon voice recognition software and may include unintentional dictation errors.   Minna Antis, MD 09/14/21 2252

## 2021-09-14 NOTE — ED Notes (Signed)
[id number]

## 2021-09-14 NOTE — ED Triage Notes (Signed)
Pt states he was eating dinner and states he felt an "an instant rush and felt weird". Pt states, he feels like his eyes are rolling back and still has this weird sensation. Pt states hes had this feeling before and was told it was anxiety. Denies any chest pain or shortness of breathe. Hx gerd and type 1 diabetes.

## 2021-09-17 ENCOUNTER — Emergency Department: Payer: Medicaid Other

## 2021-09-17 ENCOUNTER — Encounter: Payer: Self-pay | Admitting: Emergency Medicine

## 2021-09-17 ENCOUNTER — Emergency Department
Admission: EM | Admit: 2021-09-17 | Discharge: 2021-09-17 | Disposition: A | Discharge status: Home / Self Care | Payer: Medicaid Other | Attending: Emergency Medicine | Admitting: Emergency Medicine

## 2021-09-17 DIAGNOSIS — E109 Type 1 diabetes mellitus without complications: Secondary | ICD-10-CM | POA: Diagnosis not present

## 2021-09-17 DIAGNOSIS — R519 Headache, unspecified: Secondary | ICD-10-CM | POA: Diagnosis present

## 2021-09-17 DIAGNOSIS — G43801 Other migraine, not intractable, with status migrainosus: Secondary | ICD-10-CM

## 2021-09-17 LAB — COMPREHENSIVE METABOLIC PANEL
ALT: 16 U/L (ref 0–44)
AST: 18 U/L (ref 15–41)
Albumin: 4.1 g/dL (ref 3.5–5.0)
Alkaline Phosphatase: 66 U/L (ref 38–126)
Anion gap: 9 (ref 5–15)
BUN: 11 mg/dL (ref 6–20)
CO2: 27 mmol/L (ref 22–32)
Calcium: 9.2 mg/dL (ref 8.9–10.3)
Chloride: 102 mmol/L (ref 98–111)
Creatinine, Ser: 0.79 mg/dL (ref 0.61–1.24)
GFR, Estimated: >60 mL/min (ref >60)
Glucose, Bld: 233 mg/dL — ABNORMAL HIGH (ref 70–99)
Potassium: 3.8 mmol/L (ref 3.5–5.1)
Sodium: 138 mmol/L (ref 135–145)
Total Bilirubin: 0.6 mg/dL (ref 0.3–1.2)
Total Protein: 8 g/dL (ref 6.5–8.1)

## 2021-09-17 LAB — URINALYSIS, ROUTINE W REFLEX MICROSCOPIC
Bacteria, UA: NONE SEEN
Bilirubin Urine: NEGATIVE
Glucose, UA: >=500 mg/dL — AB
Hgb urine dipstick: NEGATIVE
Ketones, ur: NEGATIVE mg/dL
Leukocytes,Ua: NEGATIVE
Nitrite: NEGATIVE
Protein, ur: NEGATIVE mg/dL
Specific Gravity, Urine: 1.001 — ABNORMAL LOW (ref 1.005–1.030)
pH: 7 (ref 5.0–8.0)

## 2021-09-17 LAB — CBC WITH DIFFERENTIAL/PLATELET
Abs Immature Granulocytes: 0 10*3/uL (ref 0.00–0.07)
Basophils Absolute: 0 10*3/uL (ref 0.0–0.1)
Basophils Relative: 1 %
Eosinophils Absolute: 0.1 10*3/uL (ref 0.0–0.5)
Eosinophils Relative: 1 %
HCT: 44.6 % (ref 39.0–52.0)
Hemoglobin: 14.5 g/dL (ref 13.0–17.0)
Immature Granulocytes: 0 %
Lymphocytes Relative: 52 %
Lymphs Abs: 2.5 10*3/uL (ref 0.7–4.0)
MCH: 26.4 pg (ref 26.0–34.0)
MCHC: 32.5 g/dL (ref 30.0–36.0)
MCV: 81.1 fL (ref 80.0–100.0)
Monocytes Absolute: 0.6 10*3/uL (ref 0.1–1.0)
Monocytes Relative: 13 %
Neutro Abs: 1.6 10*3/uL — ABNORMAL LOW (ref 1.7–7.7)
Neutrophils Relative %: 33 %
Platelets: 217 10*3/uL (ref 150–400)
RBC: 5.5 MIL/uL (ref 4.22–5.81)
RDW: 12.8 % (ref 11.5–15.5)
WBC: 4.8 10*3/uL (ref 4.0–10.5)
nRBC: 0 % (ref 0.0–0.2)

## 2021-09-17 LAB — TROPONIN I (HIGH SENSITIVITY)
Troponin I (High Sensitivity): <2 ng/L (ref <18)
Troponin I (High Sensitivity): 3 ng/L (ref <18)

## 2021-09-17 IMAGING — MR MR HEAD W/O CM
2 series · 40 of 48 positions shown · non-contrast
Comparison: None Available.

CLINICAL DATA: Dizziness, persistent/recurrent, cardiac or vascular
cause suspected

EXAM:
MRI HEAD WITHOUT CONTRAST
MRA HEAD WITHOUT CONTRAST
TECHNIQUE: Multiplanar, multi-echo pulse sequences of the brain and surrounding
structures were acquired without intravenous contrast. Angiographic
images of the Circle of Willis were acquired using MRA technique
without intravenous contrast.

[Series 5: ax dwi_tracew · axial · 3.0mm · 0.65mm/px · z∈[-106,+49]mm · 24 of 48 slices shown]
[im 1/48]
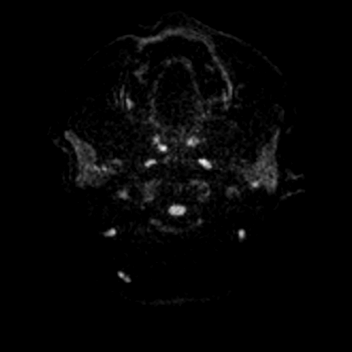
[im 3/48]
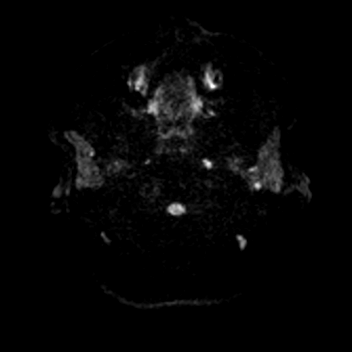
[im 5/48]
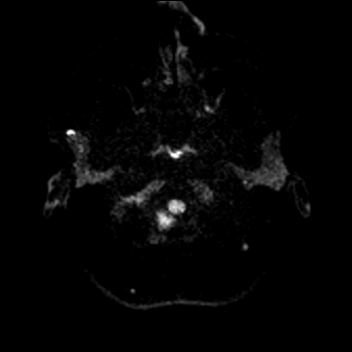
[im 7/48]
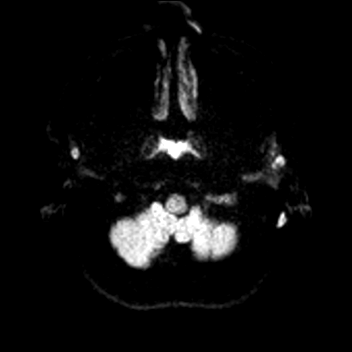
[im 9/48]
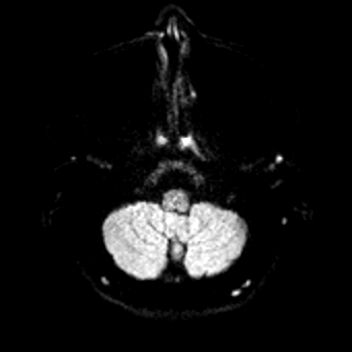
[im 11/48]
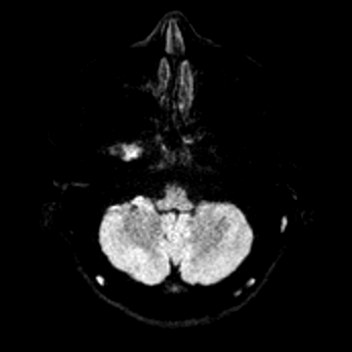
[im 13/48]
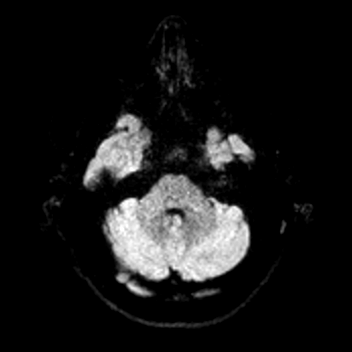
[im 15/48]
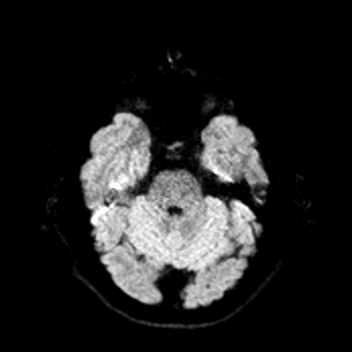
[im 17/48]
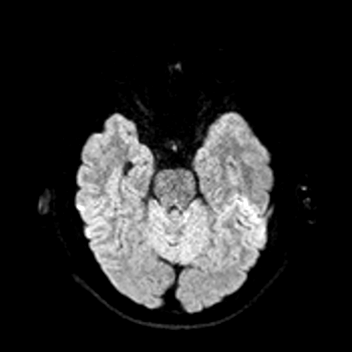
[im 19/48]
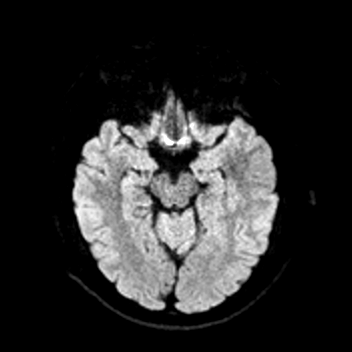
[im 21/48]
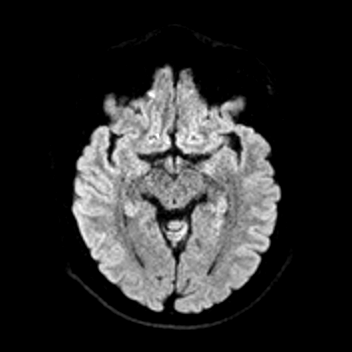
[im 23/48]
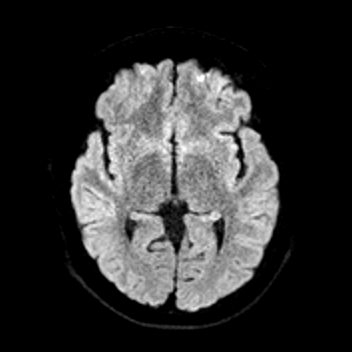
[im 25/48]
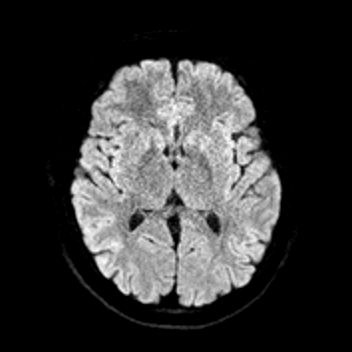
[im 27/48]
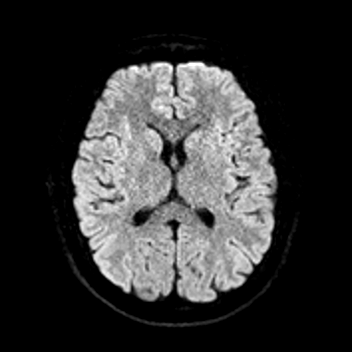
[im 29/48]
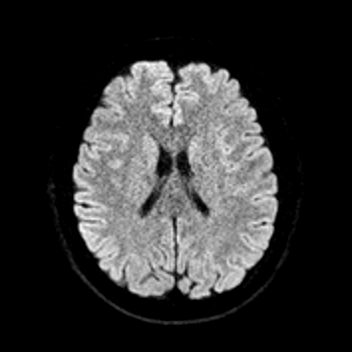
[im 31/48]
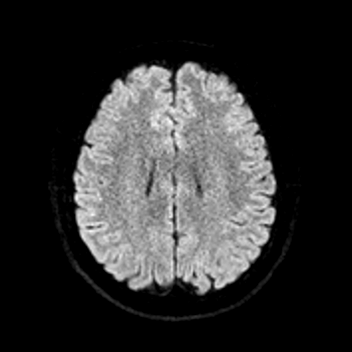
[im 33/48]
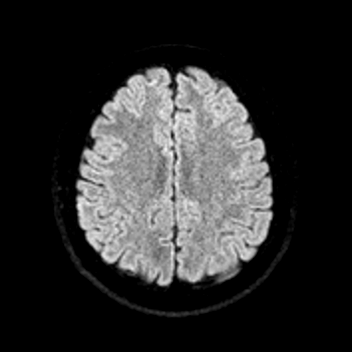
[im 35/48]
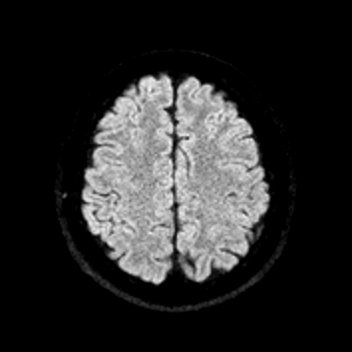
[im 37/48]
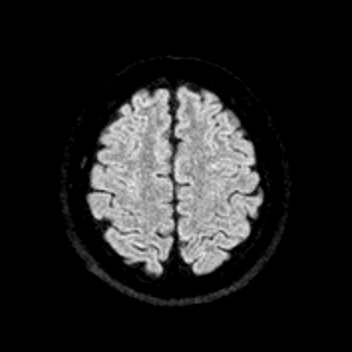
[im 39/48]
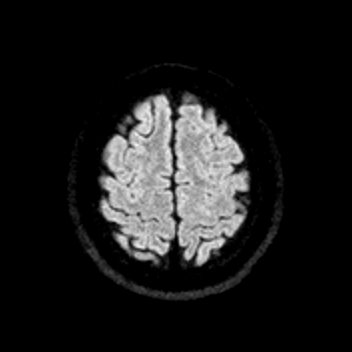
[im 41/48]
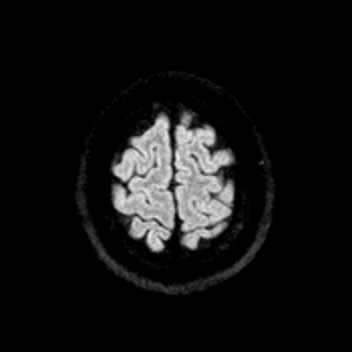
[im 43/48]
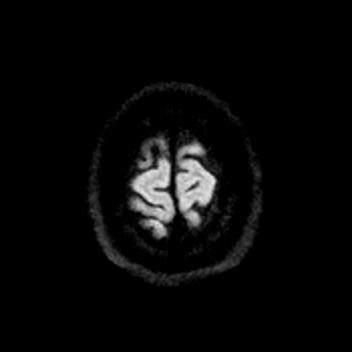
[im 45/48]
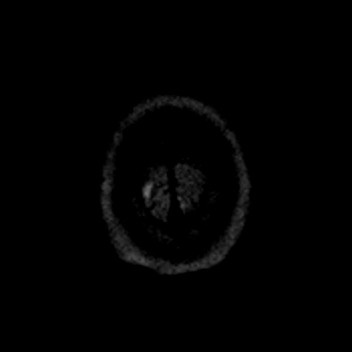
[im 48/48]
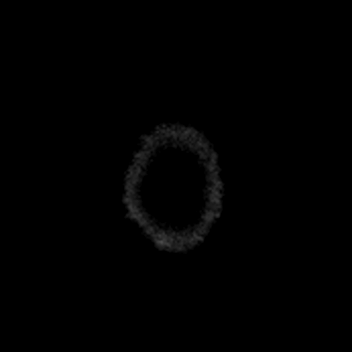

[Series 6: ax dwi_adc · axial · 3.0mm · 0.65mm/px · z∈[-106,+39]mm · 16 of 48 slices shown]
[im 1/48]
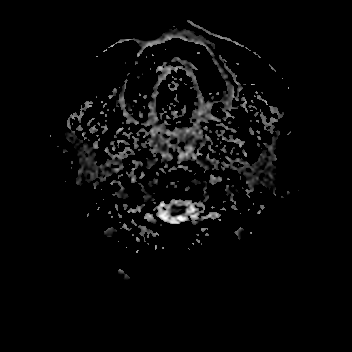
[im 3/48]
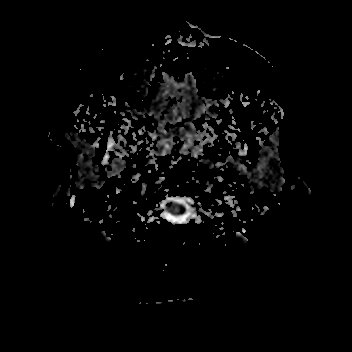
[im 5/48]
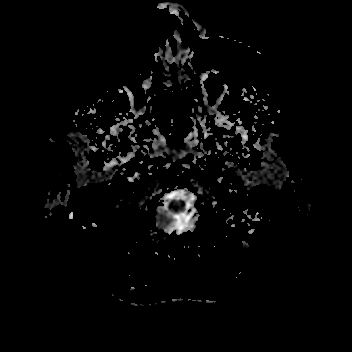
[im 7/48]
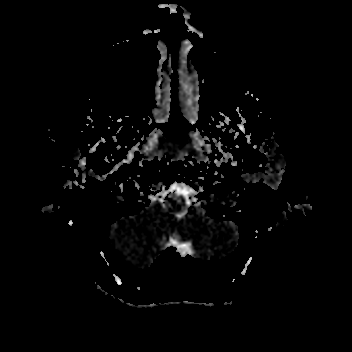
[im 9/48]
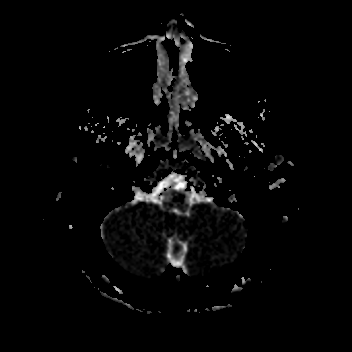
[im 11/48]
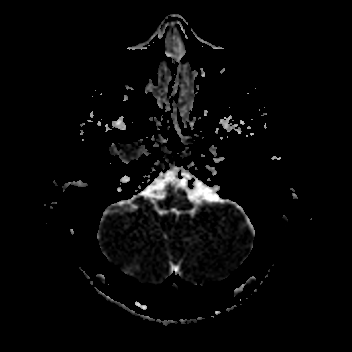
[im 13/48]
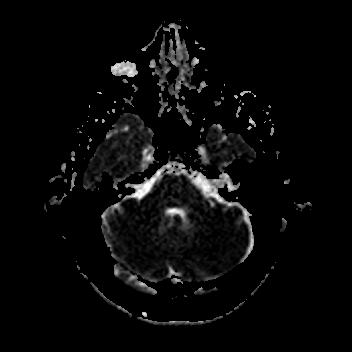
[im 15/48]
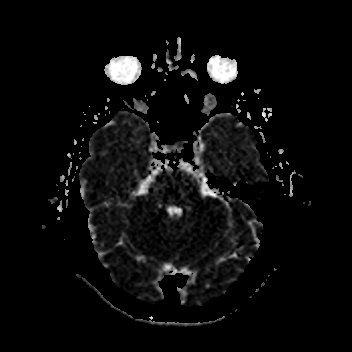
[im 17/48]
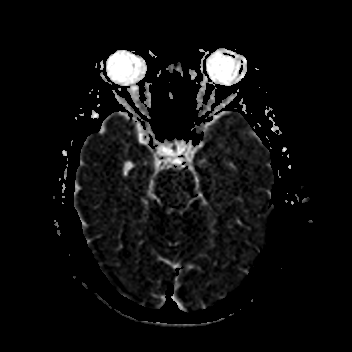
[im 21/48]
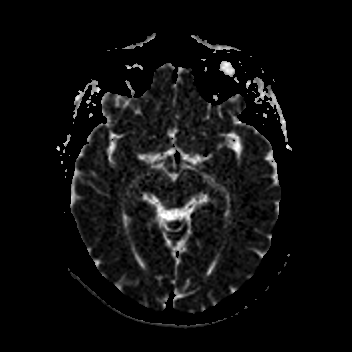
[im 25/48]
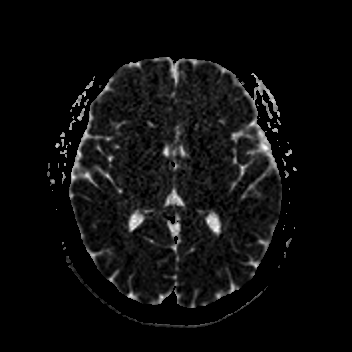
[im 27/48]
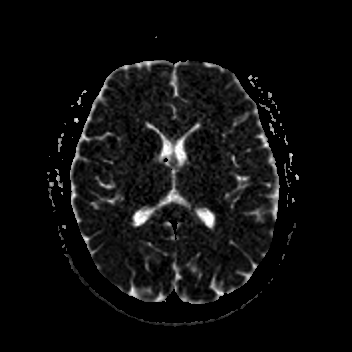
[im 33/48]
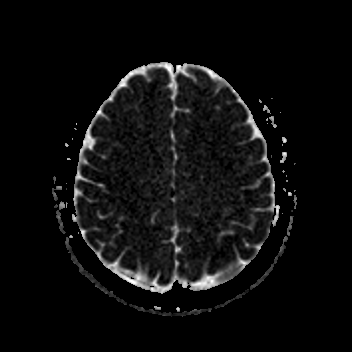
[im 39/48]
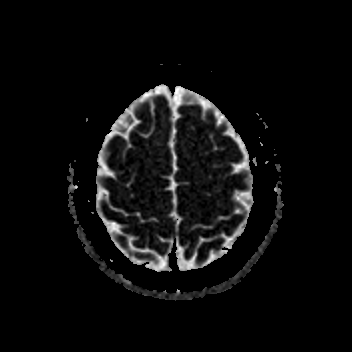
[im 41/48]
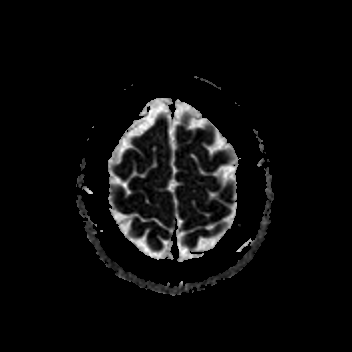
[im 45/48]
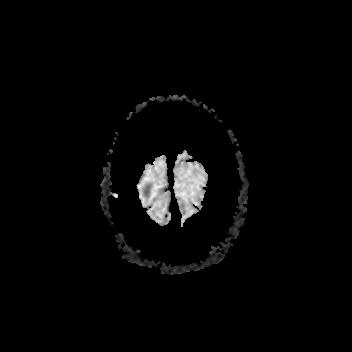

[40 of 48 positions shown; findings below may reference images not displayed]

FINDINGS: MRI HEAD FINDINGS

Only the axial DWI sequence was obtained. Patient declined further
imaging. There is no acute infarction. Ventricles and sulci are
normal in size and configuration. No mass effect.

MRA HEAD FINDINGS

Anterior circulation: Intracranial internal carotid arteries are
patent. Anterior and middle cerebral arteries are patent.

Posterior circulation: Intracranial vertebral arteries, basilar
artery, and posterior cerebral arteries are patent.

Other: No aneurysm.
IMPRESSION: No acute infarction. Patient declined to continue after only a
single sequence for MRI brain.

Unremarkable vascular imaging.

## 2021-09-17 IMAGING — MR MR MRA HEAD W/O CM
1 series · 27 of 48 positions shown · non-contrast
Comparison: None Available.

CLINICAL DATA: Dizziness, persistent/recurrent, cardiac or vascular
cause suspected

EXAM:
MRI HEAD WITHOUT CONTRAST
MRA HEAD WITHOUT CONTRAST
TECHNIQUE: Multiplanar, multi-echo pulse sequences of the brain and surrounding
structures were acquired without intravenous contrast. Angiographic
images of the Circle of Willis were acquired using MRA technique
without intravenous contrast.

[Series 7: TOF · axial · 0.5mm · 0.48mm/px · z∈[-104,-7]mm · 27 of 217 slices shown]
[im 1/217]
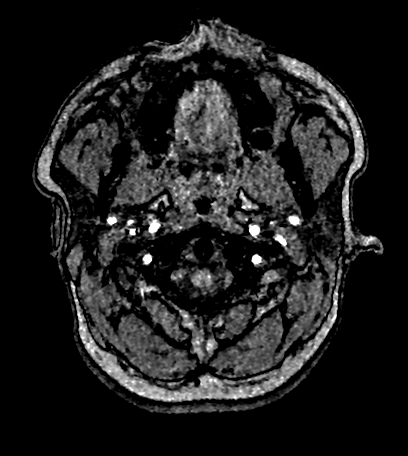
[im 5/217]
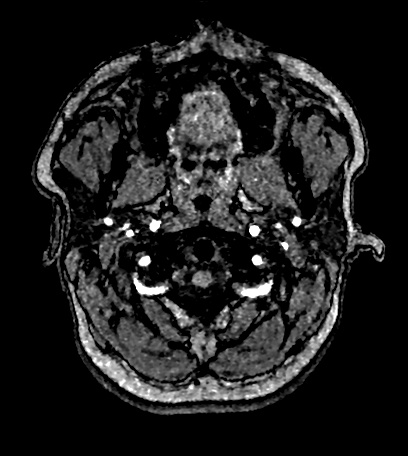
[im 10/217]
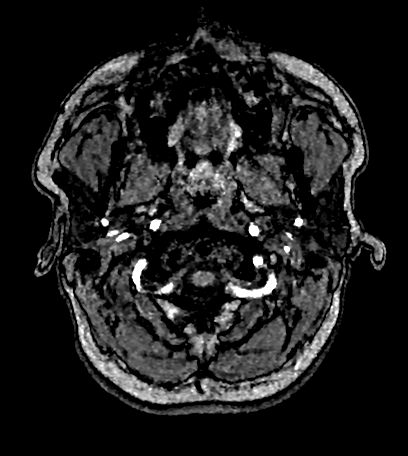
[im 14/217]
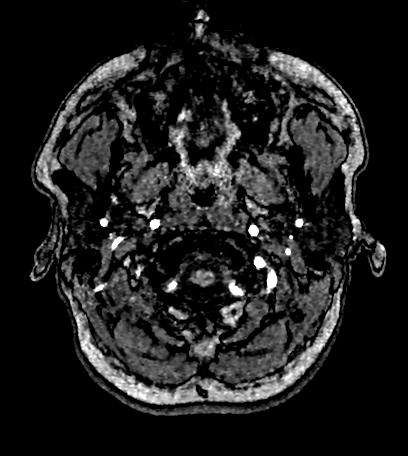
[im 19/217]
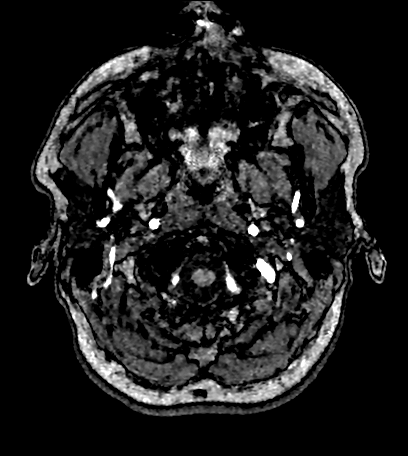
[im 23/217]
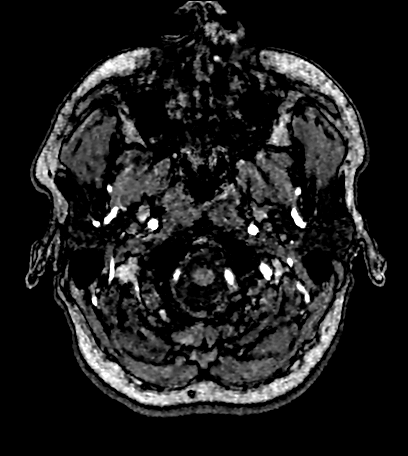
[im 28/217]
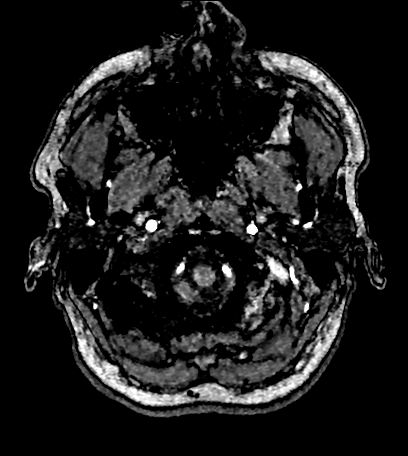
[im 33/217]
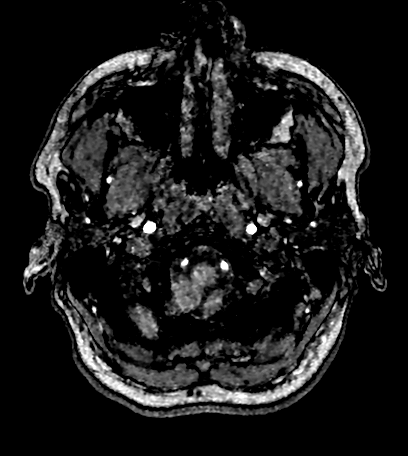
[im 37/217]
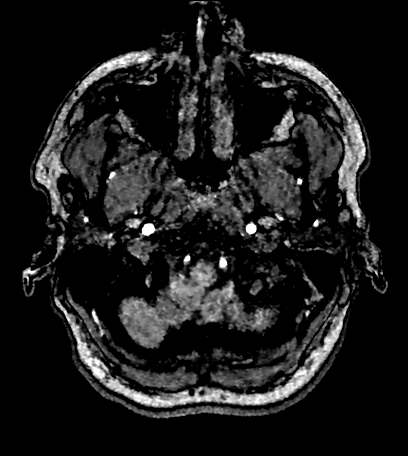
[im 42/217]
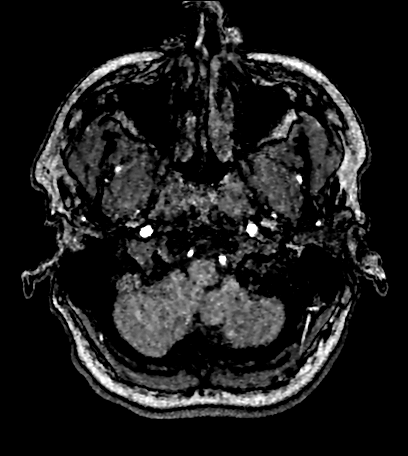
[im 46/217]
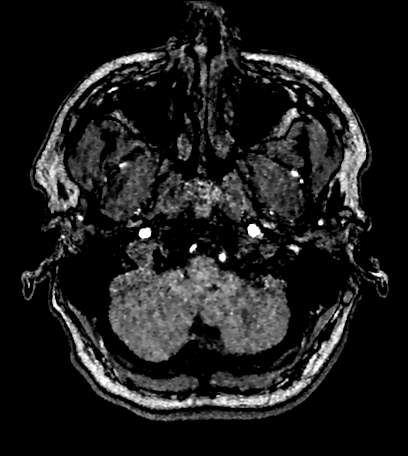
[im 51/217]
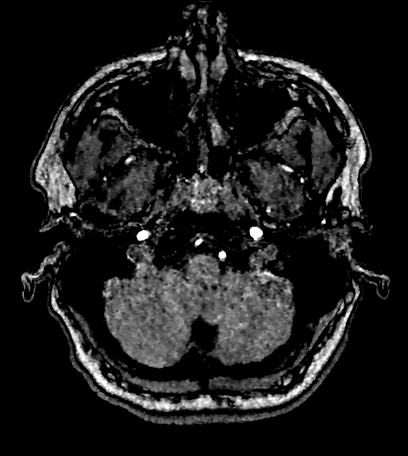
[im 56/217]
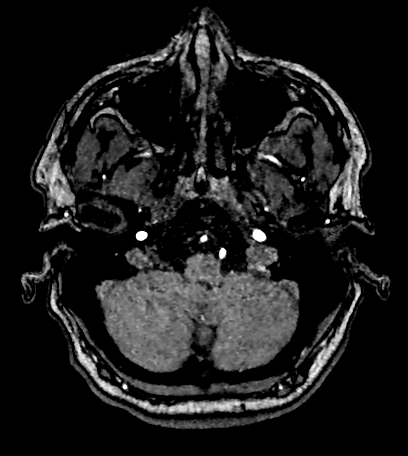
[im 60/217]
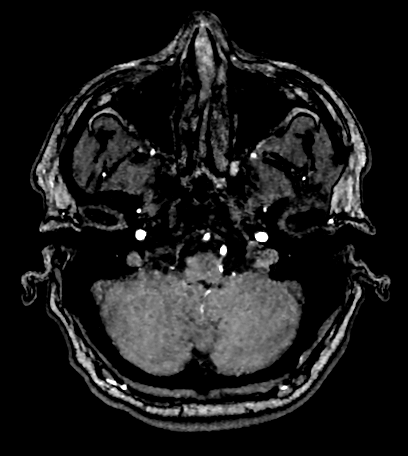
[im 65/217]
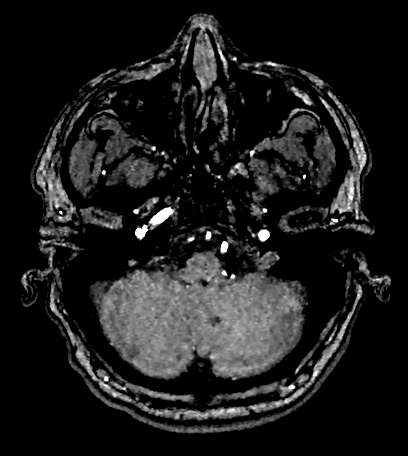
[im 69/217]
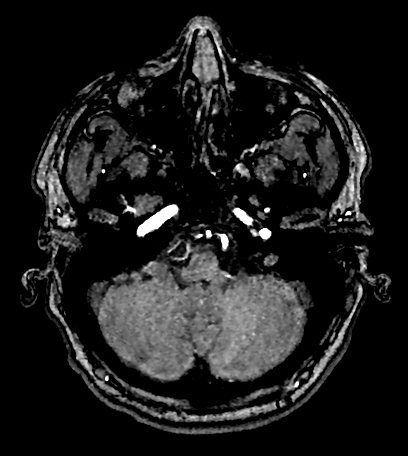
[im 74/217]
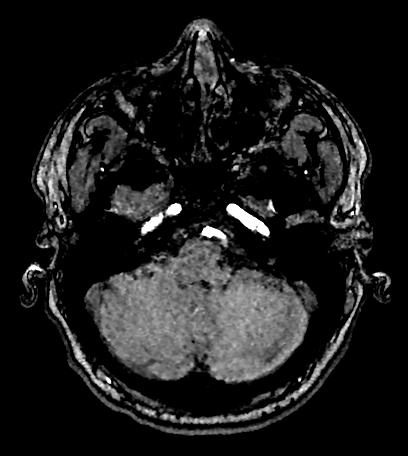
[im 79/217]
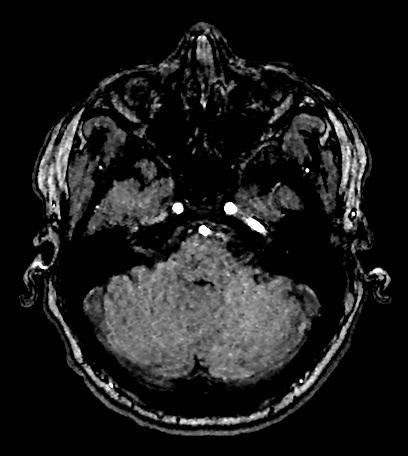
[im 83/217]
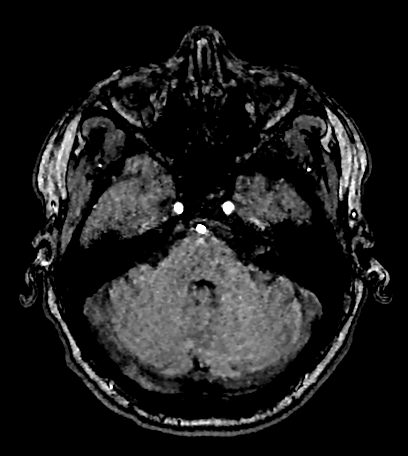
[im 88/217]
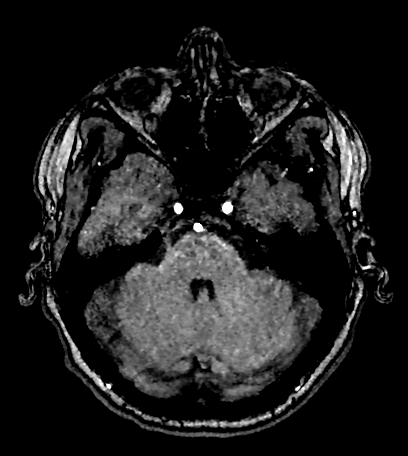
[im 97/217]
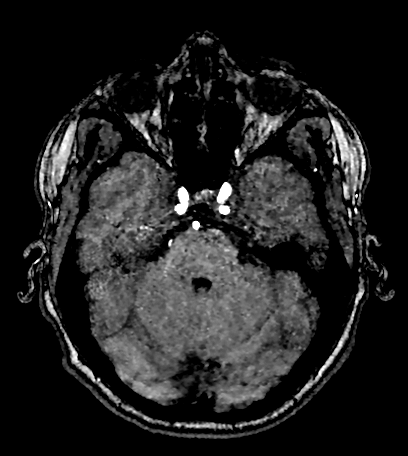
[im 111/217]
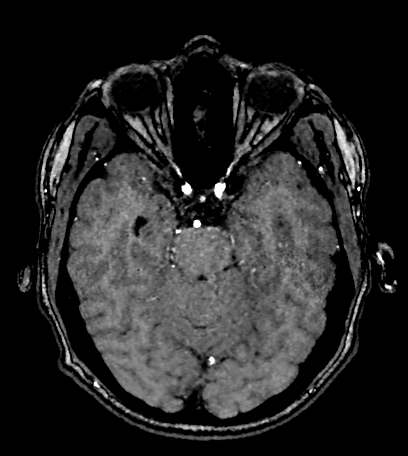
[im 125/217]
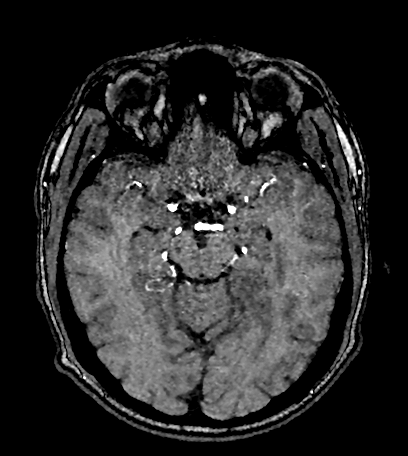
[im 152/217]
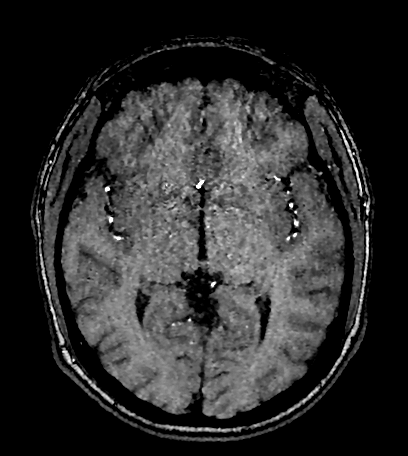
[im 180/217]
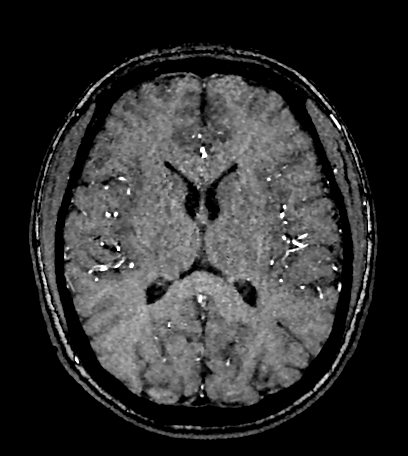
[im 184/217]
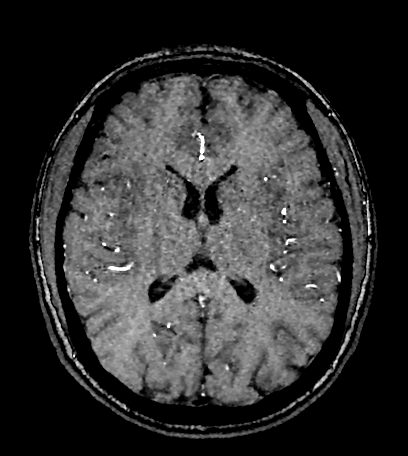
[im 207/217]
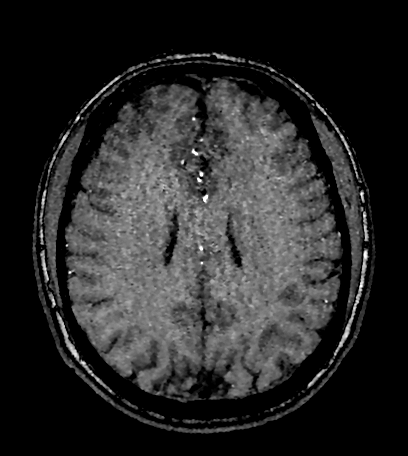

[27 of 48 positions shown; findings below may reference images not displayed]

FINDINGS: MRI HEAD FINDINGS

Only the axial DWI sequence was obtained. Patient declined further
imaging. There is no acute infarction. Ventricles and sulci are
normal in size and configuration. No mass effect.

MRA HEAD FINDINGS

Anterior circulation: Intracranial internal carotid arteries are
patent. Anterior and middle cerebral arteries are patent.

Posterior circulation: Intracranial vertebral arteries, basilar
artery, and posterior cerebral arteries are patent.

Other: No aneurysm.
IMPRESSION: No acute infarction. Patient declined to continue after only a
single sequence for MRI brain.

Unremarkable vascular imaging.

## 2021-09-17 MED ORDER — NAPROXEN 500 MG PO TABS: 500.0000 mg | ORAL_TABLET | Freq: Two times a day (BID) | ORAL | 0 refills | Status: DC

## 2021-09-17 MED ORDER — KETOROLAC TROMETHAMINE 10 MG PO TABS
10.0000 mg | ORAL_TABLET | Freq: Once | ORAL | Status: AC
  Administered 2021-09-17: 10 mg via ORAL
  Filled 2021-09-17: qty 1

## 2021-09-17 MED ORDER — METOCLOPRAMIDE HCL 10 MG PO TABS
10.0000 mg | ORAL_TABLET | Freq: Once | ORAL | Status: AC
  Administered 2021-09-17: 10 mg via ORAL
  Filled 2021-09-17: qty 1

## 2021-09-17 MED ORDER — DIPHENHYDRAMINE HCL 25 MG PO CAPS
50.0000 mg | ORAL_CAPSULE | Freq: Once | ORAL | Status: AC
  Administered 2021-09-17: 50 mg via ORAL
  Filled 2021-09-17: qty 2

## 2021-09-17 MED ORDER — FAMOTIDINE 20 MG PO TABS: 20.0000 mg | ORAL_TABLET | Freq: Two times a day (BID) | ORAL | 0 refills | Status: DC

## 2021-09-17 MED ORDER — METOCLOPRAMIDE HCL 10 MG PO TABS: 10.0000 mg | ORAL_TABLET | Freq: Four times a day (QID) | ORAL | 0 refills | Status: DC | PRN

## 2021-09-17 NOTE — ED Triage Notes (Signed)
Pt presents via POV with complaints of dizziness that started several days ago and he was seen here and treated for anxiety. He states that the sx have gotten worse and he discussed his sx with an on call physician tonight from [place] who recommend he come to the closest ED to get an an MRI to rule out neurological issues. He states his sx include nausea without emesis and heaviness in his head which is causing him to be anxious. Denies CP or SOB.

## 2021-09-17 NOTE — ED Provider Notes (Signed)
[facility] Provider Note    Event Date/Time   First MD Initiated Contact with Patient 09/17/21 [phone]     (approximate)   History   Chief Complaint: Dizziness   HPI  Thomas Maddox. is a 21 y.o. male with a history of adjustment disorder with anxiety, GERD, type 1 diabetes who comes ED complaining of dizziness and anxiety for the past several days, associated with a left-sided retro-orbital headache.  No recent head trauma.  He does report some blurry vision with the symptoms which are intermittent, occurring up to 4 times a day lasting an hour or 2.  No aggravating or alleviating factors.  Nonradiating.  No vomiting or fever or neck stiffness.  No temporal pattern to headaches.  Eating and drinking okay.     Physical Exam   Triage Vital Signs: ED Triage Vitals  Enc Vitals Group     BP 09/17/21 0043 (!) 133/92     Pulse Rate 09/17/21 0043 92     Resp 09/17/21 0043 18     Temp 09/17/21 0043 98.4 F (36.9 C)     Temp Source 09/17/21 0043 Oral     SpO2 09/17/21 0043 98 %     Weight 09/17/21 0044 163 lb (73.9 kg)     Height 09/17/21 0044 5\' 7"  (1.702 m)     Head Circumference --      Peak Flow --      Pain Score 09/17/21 0043 0     Pain Loc --      Pain Edu? --      Excl. in [place]? --     Most recent vital signs: Vitals:   09/17/21 0530 09/17/21 0721  BP: 130/84 121/85  Pulse: 80 70  Resp: 18 16  Temp:  98.5 F (36.9 C)  SpO2: 99% 100%    General: Awake, no distress.  CV:  Good peripheral perfusion.  Regular rate and rhythm Resp:  Normal effort.  Clear to auscultation bilaterally Abd:  No distention.  Other:  Moist oral mucosa.  PERRL, EOMI, no nystagmus   ED Results / Procedures / Treatments   Labs (all labs ordered are listed, but only abnormal results are displayed) Labs Reviewed  CBC WITH DIFFERENTIAL/PLATELET - Abnormal; Notable for the following components:      Result Value   Neutro Abs 1.6 (*)    All other components  within normal limits  COMPREHENSIVE METABOLIC PANEL - Abnormal; Notable for the following components:   Glucose, Bld 233 (*)    All other components within normal limits  URINALYSIS, ROUTINE W REFLEX MICROSCOPIC - Abnormal; Notable for the following components:   Color, Urine COLORLESS (*)    APPearance CLEAR (*)    Specific Gravity, Urine 1.001 (*)    Glucose, UA >=500 (*)    All other components within normal limits  TROPONIN I (HIGH SENSITIVITY)  TROPONIN I (HIGH SENSITIVITY)     EKG Interpreted by me Normal sinus rhythm rate of 82.  Right axis, normal intervals.  Normal QRS ST segments and T waves.  No ischemic changes.   RADIOLOGY MRI brain viewed and interpreted by me, no aneurysm or intracranial mass.  Radiology report reviewed.   PROCEDURES:  Procedures   MEDICATIONS ORDERED IN ED: Medications  ketorolac (TORADOL) tablet 10 mg (10 mg Oral Given 09/17/21 0724)  metoCLOPramide (REGLAN) tablet 10 mg (10 mg Oral Given 09/17/21 0724)  diphenhydrAMINE (BENADRYL) capsule 50 mg (50 mg Oral Given 09/17/21  [email or web address])     IMPRESSION / MDM / ASSESSMENT AND PLAN / ED COURSE  I reviewed the triage vital signs and the nursing notes.                              Differential diagnosis includes, but is not limited to, migraine headache, anxiety, intracranial mass, cerebral aneurysm  Patient's presentation is most consistent with acute presentation with potential threat to life or bodily function.  Patient presents with persistent headache and dizziness symptoms associated with some blurry vision.  Patient reports that after recent ED evaluation, he discussed his symptoms with his primary care and neurologist who both recommended he come back to the ED for neuroimaging.-The patient to be calm, nontoxic, neuro exam is nonfocal.  I recommended MRI/MRA which patient agreed to.  However, he did not allow the full sequence of MRI images to be obtained, but MRA and DWI sequence are normal.  He  is feeling better after medications.  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   Considering the patient's symptoms, medical history, and physical examination today, I have low suspicion for ischemic stroke, intracranial hemorrhage, meningitis, encephalitis, carotid or vertebral dissection, venous sinus thrombosis, MS, intracranial hypertension, glaucoma, CRAO, CRVO, or temporal arteritis.        FINAL CLINICAL IMPRESSION(S) / ED DIAGNOSES   Final diagnoses:  Other migraine with status migrainosus, not intractable     Rx / DC Orders   ED Discharge Orders          Ordered    naproxen (NAPROSYN) 500 MG tablet  2 times daily with meals        09/17/21 0916    metoCLOPramide (REGLAN) 10 MG tablet  Every 6 hours PRN        09/17/21 0916    famotidine (PEPCID) 20 MG tablet  2 times daily        09/17/21 8786             Note:  This document was prepared using Dragon voice recognition software and may include unintentional dictation errors.   Sharman Cheek, MD 09/17/21 [phone]

## 2021-09-17 NOTE — ED Notes (Signed)
Pt offered wheelchair but pt declined. Pt attempted to sign for d/c paperwork and education but topaz froze up.

## 2021-09-17 NOTE — ED Notes (Signed)
Patient transported to MRI 

## 2021-10-17 ENCOUNTER — Other Ambulatory Visit: Payer: Self-pay

## 2021-10-17 ENCOUNTER — Emergency Department
Admission: EM | Admit: 2021-10-17 | Discharge: 2021-10-17 | Discharge status: Against Medical Advice | Payer: Medicaid Other | Attending: Emergency Medicine | Admitting: Emergency Medicine

## 2021-10-17 DIAGNOSIS — Z5321 Procedure and treatment not carried out due to patient leaving prior to being seen by health care provider: Secondary | ICD-10-CM | POA: Insufficient documentation

## 2021-10-17 DIAGNOSIS — R55 Syncope and collapse: Secondary | ICD-10-CM | POA: Insufficient documentation

## 2021-10-17 DIAGNOSIS — F419 Anxiety disorder, unspecified: Secondary | ICD-10-CM | POA: Insufficient documentation

## 2021-10-17 LAB — URINALYSIS, ROUTINE W REFLEX MICROSCOPIC
Bacteria, UA: NONE SEEN
Bilirubin Urine: NEGATIVE
Glucose, UA: >=500 mg/dL — AB
Hgb urine dipstick: NEGATIVE
Ketones, ur: 20 mg/dL — AB
Leukocytes,Ua: NEGATIVE
Nitrite: NEGATIVE
Protein, ur: NEGATIVE mg/dL
Specific Gravity, Urine: 1.028 (ref 1.005–1.030)
pH: 5 (ref 5.0–8.0)

## 2021-10-17 LAB — COMPREHENSIVE METABOLIC PANEL
ALT: 15 U/L (ref 0–44)
AST: 16 U/L (ref 15–41)
Albumin: 4.6 g/dL (ref 3.5–5.0)
Alkaline Phosphatase: 70 U/L (ref 38–126)
Anion gap: 8 (ref 5–15)
BUN: 16 mg/dL (ref 6–20)
CO2: 27 mmol/L (ref 22–32)
Calcium: 9.9 mg/dL (ref 8.9–10.3)
Chloride: 102 mmol/L (ref 98–111)
Creatinine, Ser: 0.9 mg/dL (ref 0.61–1.24)
GFR, Estimated: >60 mL/min (ref >60)
Glucose, Bld: 237 mg/dL — ABNORMAL HIGH (ref 70–99)
Potassium: 4.3 mmol/L (ref 3.5–5.1)
Sodium: 137 mmol/L (ref 135–145)
Total Bilirubin: 0.9 mg/dL (ref 0.3–1.2)
Total Protein: 8.4 g/dL — ABNORMAL HIGH (ref 6.5–8.1)

## 2021-10-17 LAB — CBC WITH DIFFERENTIAL/PLATELET
Abs Immature Granulocytes: 0.01 10*3/uL (ref 0.00–0.07)
Basophils Absolute: 0 10*3/uL (ref 0.0–0.1)
Basophils Relative: 1 %
Eosinophils Absolute: 0.1 10*3/uL (ref 0.0–0.5)
Eosinophils Relative: 2 %
HCT: 47.4 % (ref 39.0–52.0)
Hemoglobin: 16 g/dL (ref 13.0–17.0)
Immature Granulocytes: 0 %
Lymphocytes Relative: 42 %
Lymphs Abs: 2.2 10*3/uL (ref 0.7–4.0)
MCH: 27 pg (ref 26.0–34.0)
MCHC: 33.8 g/dL (ref 30.0–36.0)
MCV: 79.9 fL — ABNORMAL LOW (ref 80.0–100.0)
Monocytes Absolute: 0.6 10*3/uL (ref 0.1–1.0)
Monocytes Relative: 12 %
Neutro Abs: 2.3 10*3/uL (ref 1.7–7.7)
Neutrophils Relative %: 43 %
Platelets: 200 10*3/uL (ref 150–400)
RBC: 5.93 MIL/uL — ABNORMAL HIGH (ref 4.22–5.81)
RDW: 12.5 % (ref 11.5–15.5)
WBC: 5.1 10*3/uL (ref 4.0–10.5)
nRBC: 0 % (ref 0.0–0.2)

## 2021-10-17 NOTE — ED Triage Notes (Signed)
Pt states he starts sweating, shaking, and has near syncope that has been happening for a couple weeks with the most recent being today. Pt denies history of seizures but states it almost feels like a seizure. Pt states he was told it was anxiety attacks but reports he takes antianxiety medication w/ mild relief. Pt is AOX4, NAD noted.

## 2021-10-17 NOTE — ED Notes (Signed)
Pt ambulatory up to desk. Pt updated on wait, states he no longer wishes to wait and is leaving. Pt ambulatory from lobby in no acute distress.

## 2021-10-17 NOTE — ED Notes (Signed)
Pt ambulatory form lobby approx 20 min ago, has not returned.

## 2022-02-19 ENCOUNTER — Ambulatory Visit: Payer: Medicaid Other | Admitting: Cardiology

## 2022-04-09 ENCOUNTER — Ambulatory Visit: Payer: Medicaid Other | Attending: Cardiology | Admitting: Cardiology

## 2022-04-09 ENCOUNTER — Encounter: Payer: Self-pay | Admitting: Cardiology

## 2022-11-25 ENCOUNTER — Other Ambulatory Visit: Payer: Self-pay

## 2022-11-25 ENCOUNTER — Emergency Department
Admission: EM | Admit: 2022-11-25 | Discharge: 2022-11-26 | Disposition: A | Discharge status: Home / Self Care | Payer: Medicaid Other | Attending: Emergency Medicine | Admitting: Emergency Medicine

## 2022-11-25 ENCOUNTER — Emergency Department: Payer: Medicaid Other

## 2022-11-25 DIAGNOSIS — Z794 Long term (current) use of insulin: Secondary | ICD-10-CM | POA: Insufficient documentation

## 2022-11-25 DIAGNOSIS — Z1152 Encounter for screening for COVID-19: Secondary | ICD-10-CM | POA: Insufficient documentation

## 2022-11-25 DIAGNOSIS — J189 Pneumonia, unspecified organism: Secondary | ICD-10-CM | POA: Insufficient documentation

## 2022-11-25 DIAGNOSIS — E109 Type 1 diabetes mellitus without complications: Secondary | ICD-10-CM | POA: Insufficient documentation

## 2022-11-25 DIAGNOSIS — R0602 Shortness of breath: Secondary | ICD-10-CM | POA: Diagnosis present

## 2022-11-25 LAB — CBC WITH DIFFERENTIAL/PLATELET
Abs Immature Granulocytes: 0.01 10*3/uL (ref 0.00–0.07)
Basophils Absolute: 0 10*3/uL (ref 0.0–0.1)
Basophils Relative: 0 %
Eosinophils Absolute: 0.1 10*3/uL (ref 0.0–0.5)
Eosinophils Relative: 2 %
HCT: 37 % — ABNORMAL LOW (ref 39.0–52.0)
Hemoglobin: 12.2 g/dL — ABNORMAL LOW (ref 13.0–17.0)
Immature Granulocytes: 0 %
Lymphocytes Relative: 23 %
Lymphs Abs: 1.3 10*3/uL (ref 0.7–4.0)
MCH: 27 pg (ref 26.0–34.0)
MCHC: 33 g/dL (ref 30.0–36.0)
MCV: 81.9 fL (ref 80.0–100.0)
Monocytes Absolute: 0.7 10*3/uL (ref 0.1–1.0)
Monocytes Relative: 13 %
Neutro Abs: 3.6 10*3/uL (ref 1.7–7.7)
Neutrophils Relative %: 62 %
Platelets: 128 10*3/uL — ABNORMAL LOW (ref 150–400)
RBC: 4.52 MIL/uL (ref 4.22–5.81)
RDW: 12.8 % (ref 11.5–15.5)
Smear Review: NORMAL
WBC: 5.7 10*3/uL (ref 4.0–10.5)
nRBC: 0 % (ref 0.0–0.2)

## 2022-11-25 LAB — RESP PANEL BY RT-PCR (RSV, FLU A&B, COVID)  RVPGX2
Influenza A by PCR: NEGATIVE
Influenza B by PCR: NEGATIVE
Resp Syncytial Virus by PCR: NEGATIVE
SARS Coronavirus 2 by RT PCR: NEGATIVE

## 2022-11-25 LAB — COMPREHENSIVE METABOLIC PANEL
ALT: 11 U/L (ref 0–44)
AST: 18 U/L (ref 15–41)
Albumin: 2.8 g/dL — ABNORMAL LOW (ref 3.5–5.0)
Alkaline Phosphatase: 55 U/L (ref 38–126)
Anion gap: 6 (ref 5–15)
BUN: 8 mg/dL (ref 6–20)
CO2: 23 mmol/L (ref 22–32)
Calcium: 8 mg/dL — ABNORMAL LOW (ref 8.9–10.3)
Chloride: 106 mmol/L (ref 98–111)
Creatinine, Ser: 0.74 mg/dL (ref 0.61–1.24)
GFR, Estimated: >60 mL/min (ref >60)
Glucose, Bld: 160 mg/dL — ABNORMAL HIGH (ref 70–99)
Potassium: 3.7 mmol/L (ref 3.5–5.1)
Sodium: 135 mmol/L (ref 135–145)
Total Bilirubin: 0.4 mg/dL (ref 0.3–1.2)
Total Protein: 7.5 g/dL (ref 6.5–8.1)

## 2022-11-25 LAB — TROPONIN I (HIGH SENSITIVITY): Troponin I (High Sensitivity): 5 ng/L (ref <18)

## 2022-11-25 LAB — LACTIC ACID, PLASMA: Lactic Acid, Venous: 0.8 mmol/L (ref 0.5–1.9)

## 2022-11-25 MED ORDER — LACTATED RINGERS IV BOLUS (SEPSIS)
1000.0000 mL | Freq: Once | INTRAVENOUS | Status: AC
  Administered 2022-11-26: 1000 mL via INTRAVENOUS

## 2022-11-25 MED ORDER — ACETAMINOPHEN 500 MG PO TABS
1000.0000 mg | ORAL_TABLET | Freq: Once | ORAL | Status: AC
  Administered 2022-11-25: 1000 mg via ORAL
  Filled 2022-11-25: qty 2

## 2022-11-25 MED ORDER — SODIUM CHLORIDE 0.9 % IV SOLN
500.0000 mg | INTRAVENOUS | Status: DC
  Administered 2022-11-26: 500 mg via INTRAVENOUS
  Filled 2022-11-25: qty 5

## 2022-11-25 MED ORDER — ONDANSETRON HCL 4 MG/2ML IJ SOLN
4.0000 mg | Freq: Once | INTRAMUSCULAR | Status: AC
  Administered 2022-11-25: 4 mg via INTRAVENOUS
  Filled 2022-11-25: qty 2

## 2022-11-25 MED ORDER — ALBUTEROL SULFATE HFA 108 (90 BASE) MCG/ACT IN AERS
2.0000 | INHALATION_SPRAY | RESPIRATORY_TRACT | Status: DC | PRN
  Filled 2022-11-25: qty 6.7

## 2022-11-25 MED ORDER — IBUPROFEN 800 MG PO TABS
800.0000 mg | ORAL_TABLET | Freq: Once | ORAL | Status: AC
  Administered 2022-11-25: 800 mg via ORAL
  Filled 2022-11-25: qty 1

## 2022-11-25 MED ORDER — ACETAMINOPHEN 500 MG PO TABS
1000.0000 mg | ORAL_TABLET | Freq: Once | ORAL | Status: DC
  Filled 2022-11-25: qty 2

## 2022-11-25 MED ORDER — SODIUM CHLORIDE 0.9 % IV SOLN
2.0000 g | INTRAVENOUS | Status: DC
  Administered 2022-11-25: 2 g via INTRAVENOUS
  Filled 2022-11-25: qty 20

## 2022-11-25 NOTE — ED Triage Notes (Signed)
Pt reports cough and SOB x 2 days. Reports seen earlier this week and dx with bronchitis. Reports cough and SOB have worsened since then. Pt reports was given abx and steroids and was taking them until today when the outpatient doctor told pt to hold them. Pt to triage via wheelchair. Alert and oriented. Breathing unlabored with symmetric chest rise and fall and speaking in full sentences.

## 2022-11-25 NOTE — ED Provider Notes (Signed)
[facility] Provider Note    Event Date/Time   First MD Initiated Contact with Patient 11/25/22 2310     (approximate)   History   Shortness of Breath   HPI  Thomas Canuto. is a 22 y.o. male who presents to the ED from home with a chief complaint of cough and shortness of breath x 2 days.  Seen earlier this week and diagnosed with bronchitis, started on Z-Pak and prednisone.  Reports worsening symptoms since.  Fever started today.  Denies chest pain, abdominal pain, nausea, vomiting or diarrhea.     Past Medical History   Past Medical History:  Diagnosis Date   ADHD (attention deficit hyperactivity disorder)    AKI (acute kidney injury) (HCC) 02/09/2016   Diabetes mellitus without complication (HCC)    DKA (diabetic ketoacidoses) 03/17/2015   Tachycardia 07/04/2019     Active Problem List   Patient Active Problem List   Diagnosis Date Noted   Intractable vomiting with nausea 07/04/2019   Type 1 diabetes mellitus without complication (HCC) 07/04/2019   Adjustment disorder with anxiety 01/20/2017   Dysphagia 03/12/2014   Gastro-esophageal reflux disease without esophagitis 03/12/2014     Past Surgical History   Past Surgical History:  Procedure Laterality Date   ESOPHAGOGASTRODUODENOSCOPY (EGD) WITH PROPOFOL N/A 02/25/2021   Procedure: ESOPHAGOGASTRODUODENOSCOPY (EGD) WITH PROPOFOL;  Surgeon: Toney Reil, MD;  Location: [facility] ENDOSCOPY;  Service: Gastroenterology;  Laterality: N/A;   NO PAST SURGERIES       Home Medications   Prior to Admission medications   Medication Sig Start Date End Date Taking? Authorizing Provider  amphetamine-dextroamphetamine (ADDERALL XR) 10 MG 24 hr capsule Take 10 mg by mouth every morning.     [provider]  amphetamine-dextroamphetamine (ADDERALL) 10 MG tablet Take 10 mg by mouth daily at 2 PM.     [provider]  blood glucose meter kit and supplies KIT Dispense based on  patient and insurance preference. Use up to four times daily as directed. (FOR ICD-9 250.00, 250.01). 07/04/19   Zigmund Daniel., MD  cetirizine (ZYRTEC) 10 MG tablet Take by mouth.    [provider]  famotidine (PEPCID) 20 MG tablet Take 1 tablet (20 mg total) by mouth 2 (two) times daily. 09/17/21   Sharman Cheek, MD  hydrOXYzine (ATARAX) 25 MG tablet Take 1 tablet (25 mg total) by mouth 3 (three) times daily as needed for anxiety. 09/14/21   Minna Antis, MD  hydrOXYzine (VISTARIL) 25 MG capsule Take 25 mg by mouth 3 (three) times daily as needed. 01/21/21   [provider]  insulin aspart (NOVOLOG) 100 UNIT/ML FlexPen Inject 8 Units into the skin 3 (three) times daily with meals. 07/04/19   Zigmund Daniel., MD  insulin degludec (TRESIBA) 100 UNIT/ML FlexTouch Pen Inject into the skin. Patient not taking: Reported on 02/25/2021 02/03/16   [provider]  Insulin Pen Needle 32G X 4 MM MISC Use with insulin 07/04/19   Zigmund Daniel., MD  metoCLOPramide (REGLAN) 10 MG tablet Take 1 tablet (10 mg total) by mouth every 6 (six) hours as needed. 09/17/21   Sharman Cheek, MD  naproxen (NAPROSYN) 500 MG tablet Take 1 tablet (500 mg total) by mouth 2 (two) times daily with a meal. 09/17/21   Sharman Cheek, MD  NOVOLOG MIX 70/30 FLEXPEN (70-30) 100 UNIT/ML FlexPen Inject into the skin. 01/24/21   [provider]  omeprazole (PRILOSEC) 40 MG  capsule Take 1 capsule (40 mg total) by mouth 2 (two) times daily before a meal. 02/25/21 03/27/21  Vanga, Loel Dubonnet, MD  promethazine (PHENERGAN) 25 MG tablet Take 1 tablet (25 mg total) by mouth every 6 (six) hours as needed for nausea or vomiting. 07/30/21   Willy Eddy, MD     Allergies  Patient has no known allergies.   Family History   Family History  Problem Relation Age of Onset   Hypertension Other      Physical Exam  Triage Vital Signs: ED Triage Vitals  Encounter Vitals  Group     BP 11/25/22 2202 (!) 140/93     Systolic BP Percentile --      Diastolic BP Percentile --      Pulse Rate 11/25/22 2202 (!) 139     Resp 11/25/22 2202 20     Temp 11/25/22 2202 (!) 100.5 F (38.1 C)     Temp Source 11/25/22 2202 Oral     SpO2 11/25/22 2202 100 %     Weight 11/25/22 2203 142 lb (64.4 kg)     Height 11/25/22 2203 5\' 6"  (1.676 m)     Head Circumference --      Peak Flow --      Pain Score 11/25/22 2202 5     Pain Loc --      Pain Education --      Exclude from Growth Chart --     Updated Vital Signs: BP (!) 140/93 (BP Location: Left Arm)   Pulse (!) 139   Temp (!) 100.5 F (38.1 C) (Oral)   Resp 20   Ht 5\' 6"  (1.676 m)   Wt 64.4 kg   SpO2 100%   BMI 22.92 kg/m    General: Awake, mild distress.  CV:  Tachycardic. Good peripheral perfusion.  Resp:  Normal effort. Scattered rhonchi. Abd:  Nontender. No distention.  Other:  Bilateral calves supple and nontender.   ED Results / Procedures / Treatments  Labs (all labs ordered are listed, but only abnormal results are displayed) Labs Reviewed  COMPREHENSIVE METABOLIC PANEL - Abnormal; Notable for the following components:      Result Value   Glucose, Bld 160 (*)    Calcium 8.0 (*)    Albumin 2.8 (*)    All other components within normal limits  RESP PANEL BY RT-PCR (RSV, FLU A&B, COVID)  RVPGX2  CULTURE, BLOOD (ROUTINE X 2)  CULTURE, BLOOD (ROUTINE X 2)  CBC WITH DIFFERENTIAL/PLATELET  LACTIC ACID, PLASMA  LACTIC ACID, PLASMA  TROPONIN I (HIGH SENSITIVITY)  TROPONIN I (HIGH SENSITIVITY)     EKG  ED ECG REPORT I, , J, the attending physician, personally viewed and interpreted this ECG.   Date: 11/25/2022  EKG Time: 2204  Rate: 136  Rhythm: sinus tachycardia  Axis: Normal  Intervals:none  ST&T Change: Nonspecific    RADIOLOGY I have independently visualized and interpreted patient's xray as well as noted the radiology interpretation:  CXR: patchy opacities  bilaterally  Official radiology report(s): DG Chest 2 View  Result Date: 11/25/2022 CLINICAL DATA:  Short S of breath EXAM: CHEST - 2 VIEW COMPARISON:  03/03/2021 FINDINGS: Heart mediastinal contours are within normal limits. Low lung volumes. Patchy opacity in the left mid and lower lung may reflect early pneumonia. No effusions or pneumothorax. No acute bony abnormality. IMPRESSION: Low lung volumes. Patchy opacities in the left mid and lower lung concerning for pneumonia. Electronically Signed   By: Caryn Bee  Dover M.D.   On: 11/25/2022 22:29     PROCEDURES:  Critical Care performed: Yes, see critical care procedure note(s)  CRITICAL CARE Performed by: Irean Hong   Total critical care time: *** minutes  Critical care time was exclusive of separately billable procedures and treating other patients.  Critical care was necessary to treat or prevent imminent or life-threatening deterioration.  Critical care was time spent personally by me on the following activities: development of treatment plan with patient and/or surrogate as well as nursing, discussions with consultants, evaluation of patient's response to treatment, examination of patient, obtaining history from patient or surrogate, ordering and performing treatments and interventions, ordering and review of laboratory studies, ordering and review of radiographic studies, pulse oximetry and re-evaluation of patient's condition.   Marland Kitchen1-3 Lead EKG Interpretation  Performed by: Irean Hong, MD Authorized by: Irean Hong, MD     Interpretation: normal     ECG rate:  136   ECG rate assessment: tachycardic     Rhythm: sinus tachycardia     Ectopy: none     Conduction: normal   Comments:     Patient placed on cardiac monitor to evaluate for arrhythmias    MEDICATIONS ORDERED IN ED: Medications  albuterol (VENTOLIN HFA) 108 (90 Base) MCG/ACT inhaler 2 puff (has no administration in time range)  lactated ringers bolus 1,000 mL  (has no administration in time range)    And  lactated ringers bolus 1,000 mL (has no administration in time range)  cefTRIAXone (ROCEPHIN) 2 g in sodium chloride 0.9 % 100 mL IVPB (has no administration in time range)  azithromycin (ZITHROMAX) 500 mg in sodium chloride 0.9 % 250 mL IVPB (has no administration in time range)  acetaminophen (TYLENOL) tablet 1,000 mg (has no administration in time range)  ondansetron (ZOFRAN) injection 4 mg (has no administration in time range)  acetaminophen (TYLENOL) tablet 1,000 mg (1,000 mg Oral Given 11/25/22 2302)     IMPRESSION / MDM / ASSESSMENT AND PLAN / ED COURSE  I reviewed the triage vital signs and the nursing notes.                             22 year old male presenting with cough, fever and shortness of breath. Differential includes, but is not limited to, viral syndrome, bronchitis including COPD exacerbation, pneumonia, reactive airway disease including asthma, CHF including exacerbation with or without pulmonary/interstitial edema, pneumothorax, ACS, thoracic trauma, and pulmonary embolism.  I have personally reviewed patient's records and note and endocrinology office visit for type 1 diabetes on 07/31/2022.  Patient's presentation is most consistent with acute presentation with potential threat to life or bodily function.  The patient is on the cardiac monitor to evaluate for evidence of arrhythmia and/or significant heart rate changes.  Laboratory results demonstrate glucose 160 without elevation of anion gap, respiratory panel negative, troponin negative.  Awaiting lactic acid and CBC.  Patient meets sepsis criteria based on temperature, pulse rate and source.  Will initiate 30 cc/kilo lactated Ringer's, IV ceftriaxone and azithromycin for antibiotic coverage.  Will reassess.      FINAL CLINICAL IMPRESSION(S) / ED DIAGNOSES   Final diagnoses:  Community acquired pneumonia, unspecified laterality     Rx / DC Orders   ED Discharge  Orders     None        Note:  This document was prepared using Dragon voice recognition software and may include unintentional dictation  errors.

## 2022-11-26 MED ORDER — AMOXICILLIN-POT CLAVULANATE 875-125 MG PO TABS: 1.0000 | ORAL_TABLET | Freq: Two times a day (BID) | ORAL | 0 refills | Status: DC

## 2022-11-26 MED ORDER — HYDROCOD POLI-CHLORPHE POLI ER 10-8 MG/5ML PO SUER: 5.0000 mL | Freq: Two times a day (BID) | ORAL | 0 refills | Status: DC | PRN

## 2022-11-26 MED ORDER — ALBUTEROL SULFATE HFA 108 (90 BASE) MCG/ACT IN AERS: 2.0000 | INHALATION_SPRAY | RESPIRATORY_TRACT | 0 refills | Status: AC | PRN

## 2022-11-26 NOTE — Discharge Instructions (Addendum)
Complete and finish the antibiotic and steroid previously prescribed to you.  Add Augmentin twice daily x 7 days, use albuterol inhaler 2 puffs every 4 hours as needed for breathing difficulty and you may take Tussionex as needed for cough.  Return to the ER for worsening symptoms

## 2022-11-28 ENCOUNTER — Other Ambulatory Visit: Payer: Self-pay

## 2022-11-28 DIAGNOSIS — Z794 Long term (current) use of insulin: Secondary | ICD-10-CM

## 2022-11-28 DIAGNOSIS — F909 Attention-deficit hyperactivity disorder, unspecified type: Secondary | ICD-10-CM | POA: Diagnosis present

## 2022-11-28 DIAGNOSIS — K3184 Gastroparesis: Secondary | ICD-10-CM | POA: Diagnosis present

## 2022-11-28 DIAGNOSIS — A419 Sepsis, unspecified organism: Principal | ICD-10-CM | POA: Diagnosis present

## 2022-11-28 DIAGNOSIS — B589 Toxoplasmosis, unspecified: Secondary | ICD-10-CM | POA: Diagnosis present

## 2022-11-28 DIAGNOSIS — Z9641 Presence of insulin pump (external) (internal): Secondary | ICD-10-CM | POA: Diagnosis present

## 2022-11-28 DIAGNOSIS — E785 Hyperlipidemia, unspecified: Secondary | ICD-10-CM | POA: Diagnosis present

## 2022-11-28 DIAGNOSIS — Z8249 Family history of ischemic heart disease and other diseases of the circulatory system: Secondary | ICD-10-CM

## 2022-11-28 DIAGNOSIS — I2510 Atherosclerotic heart disease of native coronary artery without angina pectoris: Secondary | ICD-10-CM | POA: Diagnosis present

## 2022-11-28 DIAGNOSIS — B20 Human immunodeficiency virus [HIV] disease: Secondary | ICD-10-CM | POA: Diagnosis present

## 2022-11-28 DIAGNOSIS — C469 Kaposi's sarcoma, unspecified: Secondary | ICD-10-CM | POA: Diagnosis present

## 2022-11-28 DIAGNOSIS — J440 Chronic obstructive pulmonary disease with acute lower respiratory infection: Secondary | ICD-10-CM | POA: Diagnosis present

## 2022-11-28 DIAGNOSIS — J180 Bronchopneumonia, unspecified organism: Secondary | ICD-10-CM | POA: Diagnosis present

## 2022-11-28 DIAGNOSIS — E538 Deficiency of other specified B group vitamins: Secondary | ICD-10-CM | POA: Diagnosis present

## 2022-11-28 DIAGNOSIS — F419 Anxiety disorder, unspecified: Secondary | ICD-10-CM | POA: Diagnosis present

## 2022-11-28 DIAGNOSIS — F17290 Nicotine dependence, other tobacco product, uncomplicated: Secondary | ICD-10-CM | POA: Diagnosis present

## 2022-11-28 DIAGNOSIS — B59 Pneumocystosis: Secondary | ICD-10-CM | POA: Diagnosis present

## 2022-11-28 DIAGNOSIS — Z79899 Other long term (current) drug therapy: Secondary | ICD-10-CM

## 2022-11-28 LAB — CBG MONITORING, ED: Glucose-Capillary: 157 mg/dL — ABNORMAL HIGH (ref 70–99)

## 2022-11-28 NOTE — ED Triage Notes (Signed)
Pt to ed from home via POV for emesis. Pt was seen  here 3 days ago and was diagnosed with pneumonia and he isnt feeling better. Pt is caox4, in no acute distress and ambulatory in triage.

## 2022-11-29 ENCOUNTER — Emergency Department: Payer: Medicaid Other

## 2022-11-29 ENCOUNTER — Other Ambulatory Visit: Payer: Self-pay

## 2022-11-29 ENCOUNTER — Inpatient Hospital Stay
Admission: EM | Admit: 2022-11-29 | Discharge: 2022-12-03 | DRG: 974 | Disposition: A | Discharge status: Home / Self Care | Payer: Medicaid Other | Attending: Internal Medicine | Admitting: Internal Medicine

## 2022-11-29 DIAGNOSIS — Z8249 Family history of ischemic heart disease and other diseases of the circulatory system: Secondary | ICD-10-CM | POA: Diagnosis not present

## 2022-11-29 DIAGNOSIS — F909 Attention-deficit hyperactivity disorder, unspecified type: Secondary | ICD-10-CM | POA: Diagnosis present

## 2022-11-29 DIAGNOSIS — E1143 Type 2 diabetes mellitus with diabetic autonomic (poly)neuropathy: Secondary | ICD-10-CM | POA: Diagnosis present

## 2022-11-29 DIAGNOSIS — E109 Type 1 diabetes mellitus without complications: Secondary | ICD-10-CM | POA: Diagnosis not present

## 2022-11-29 DIAGNOSIS — K3184 Gastroparesis: Secondary | ICD-10-CM | POA: Diagnosis present

## 2022-11-29 DIAGNOSIS — E538 Deficiency of other specified B group vitamins: Secondary | ICD-10-CM | POA: Diagnosis present

## 2022-11-29 DIAGNOSIS — B589 Toxoplasmosis, unspecified: Secondary | ICD-10-CM | POA: Diagnosis present

## 2022-11-29 DIAGNOSIS — I2510 Atherosclerotic heart disease of native coronary artery without angina pectoris: Secondary | ICD-10-CM | POA: Diagnosis present

## 2022-11-29 DIAGNOSIS — R112 Nausea with vomiting, unspecified: Secondary | ICD-10-CM

## 2022-11-29 DIAGNOSIS — Z794 Long term (current) use of insulin: Secondary | ICD-10-CM | POA: Diagnosis not present

## 2022-11-29 DIAGNOSIS — J189 Pneumonia, unspecified organism: Secondary | ICD-10-CM | POA: Diagnosis not present

## 2022-11-29 DIAGNOSIS — A419 Sepsis, unspecified organism: Secondary | ICD-10-CM | POA: Diagnosis present

## 2022-11-29 DIAGNOSIS — J690 Pneumonitis due to inhalation of food and vomit: Secondary | ICD-10-CM | POA: Diagnosis present

## 2022-11-29 DIAGNOSIS — Z21 Asymptomatic human immunodeficiency virus [HIV] infection status: Secondary | ICD-10-CM | POA: Diagnosis present

## 2022-11-29 DIAGNOSIS — B20 Human immunodeficiency virus [HIV] disease: Secondary | ICD-10-CM | POA: Diagnosis present

## 2022-11-29 DIAGNOSIS — R0609 Other forms of dyspnea: Secondary | ICD-10-CM | POA: Diagnosis not present

## 2022-11-29 DIAGNOSIS — F419 Anxiety disorder, unspecified: Secondary | ICD-10-CM | POA: Diagnosis present

## 2022-11-29 DIAGNOSIS — Z79899 Other long term (current) drug therapy: Secondary | ICD-10-CM | POA: Diagnosis not present

## 2022-11-29 DIAGNOSIS — E1043 Type 1 diabetes mellitus with diabetic autonomic (poly)neuropathy: Secondary | ICD-10-CM | POA: Diagnosis not present

## 2022-11-29 DIAGNOSIS — E785 Hyperlipidemia, unspecified: Secondary | ICD-10-CM | POA: Diagnosis present

## 2022-11-29 DIAGNOSIS — B59 Pneumocystosis: Secondary | ICD-10-CM | POA: Diagnosis present

## 2022-11-29 DIAGNOSIS — J180 Bronchopneumonia, unspecified organism: Secondary | ICD-10-CM | POA: Diagnosis present

## 2022-11-29 DIAGNOSIS — C469 Kaposi's sarcoma, unspecified: Secondary | ICD-10-CM | POA: Diagnosis present

## 2022-11-29 DIAGNOSIS — J440 Chronic obstructive pulmonary disease with acute lower respiratory infection: Secondary | ICD-10-CM | POA: Diagnosis present

## 2022-11-29 DIAGNOSIS — J181 Lobar pneumonia, unspecified organism: Secondary | ICD-10-CM | POA: Diagnosis not present

## 2022-11-29 DIAGNOSIS — F17290 Nicotine dependence, other tobacco product, uncomplicated: Secondary | ICD-10-CM | POA: Diagnosis present

## 2022-11-29 DIAGNOSIS — Z9641 Presence of insulin pump (external) (internal): Secondary | ICD-10-CM | POA: Diagnosis present

## 2022-11-29 LAB — CBC
HCT: 34 % — ABNORMAL LOW (ref 39.0–52.0)
Hemoglobin: 11.1 g/dL — ABNORMAL LOW (ref 13.0–17.0)
MCH: 26.4 pg (ref 26.0–34.0)
MCHC: 32.6 g/dL (ref 30.0–36.0)
MCV: 81 fL (ref 80.0–100.0)
Platelets: 166 10*3/uL (ref 150–400)
RBC: 4.2 MIL/uL — ABNORMAL LOW (ref 4.22–5.81)
RDW: 12.5 % (ref 11.5–15.5)
WBC: 2.5 10*3/uL — ABNORMAL LOW (ref 4.0–10.5)
nRBC: 0 % (ref 0.0–0.2)

## 2022-11-29 LAB — COMPREHENSIVE METABOLIC PANEL WITH GFR
ALT: 13 U/L (ref 0–44)
AST: 19 U/L (ref 15–41)
Albumin: 2.6 g/dL — ABNORMAL LOW (ref 3.5–5.0)
Alkaline Phosphatase: 53 U/L (ref 38–126)
Anion gap: 9 (ref 5–15)
BUN: <5 mg/dL — ABNORMAL LOW (ref 6–20)
CO2: 24 mmol/L (ref 22–32)
Calcium: 7.8 mg/dL — ABNORMAL LOW (ref 8.9–10.3)
Chloride: 102 mmol/L (ref 98–111)
Creatinine, Ser: 0.79 mg/dL (ref 0.61–1.24)
GFR, Estimated: >60 mL/min (ref >60)
Glucose, Bld: 162 mg/dL — ABNORMAL HIGH (ref 70–99)
Potassium: 3.5 mmol/L (ref 3.5–5.1)
Sodium: 135 mmol/L (ref 135–145)
Total Bilirubin: 0.6 mg/dL (ref 0.3–1.2)
Total Protein: 7.3 g/dL (ref 6.5–8.1)

## 2022-11-29 LAB — GLUCOSE, CAPILLARY
Glucose-Capillary: 165 mg/dL — ABNORMAL HIGH (ref 70–99)
Glucose-Capillary: 200 mg/dL — ABNORMAL HIGH (ref 70–99)
Glucose-Capillary: 213 mg/dL — ABNORMAL HIGH (ref 70–99)

## 2022-11-29 LAB — URINALYSIS, ROUTINE W REFLEX MICROSCOPIC
Bilirubin Urine: NEGATIVE
Glucose, UA: NEGATIVE mg/dL
Hgb urine dipstick: NEGATIVE
Ketones, ur: NEGATIVE mg/dL
Leukocytes,Ua: NEGATIVE
Nitrite: NEGATIVE
Protein, ur: 100 mg/dL — AB
Specific Gravity, Urine: 1.009 (ref 1.005–1.030)
pH: 7 (ref 5.0–8.0)

## 2022-11-29 LAB — LACTIC ACID, PLASMA
Lactic Acid, Venous: 0.7 mmol/L (ref 0.5–1.9)
Lactic Acid, Venous: 0.7 mmol/L (ref 0.5–1.9)

## 2022-11-29 LAB — LIPASE, BLOOD: Lipase: 26 U/L (ref 11–51)

## 2022-11-29 LAB — HIV ANTIBODY (ROUTINE TESTING W REFLEX): HIV Screen 4th Generation wRfx: REACTIVE — AB

## 2022-11-29 MED ORDER — PIPERACILLIN-TAZOBACTAM 3.375 G IVPB
3.3750 g | Freq: Three times a day (TID) | INTRAVENOUS | Status: DC
  Administered 2022-11-29 – 2022-11-30 (×5): 3.375 g via INTRAVENOUS
  Filled 2022-11-29 (×5): qty 50

## 2022-11-29 MED ORDER — IPRATROPIUM-ALBUTEROL 0.5-2.5 (3) MG/3ML IN SOLN: 3.0000 mL | Freq: Four times a day (QID) | RESPIRATORY_TRACT | Status: DC | PRN

## 2022-11-29 MED ORDER — ALBUTEROL SULFATE (2.5 MG/3ML) 0.083% IN NEBU: 3.0000 mL | INHALATION_SOLUTION | RESPIRATORY_TRACT | Status: DC | PRN

## 2022-11-29 MED ORDER — PROMETHAZINE HCL 25 MG PO TABS
25.0000 mg | ORAL_TABLET | Freq: Four times a day (QID) | ORAL | Status: DC | PRN
  Administered 2022-11-29: 25 mg via ORAL
  Filled 2022-11-29 (×2): qty 1

## 2022-11-29 MED ORDER — AMPHETAMINE-DEXTROAMPHET ER 30 MG PO CP24
30.0000 mg | ORAL_CAPSULE | Freq: Every day | ORAL | Status: DC
  Filled 2022-11-29: qty 1

## 2022-11-29 MED ORDER — NAPROXEN 500 MG PO TABS: 500.0000 mg | ORAL_TABLET | Freq: Two times a day (BID) | ORAL | Status: DC | PRN

## 2022-11-29 MED ORDER — AZITHROMYCIN 500 MG PO TABS
500.0000 mg | ORAL_TABLET | Freq: Every day | ORAL | Status: DC
  Administered 2022-11-30 – 2022-12-02 (×3): 500 mg via ORAL
  Filled 2022-11-29 (×3): qty 1

## 2022-11-29 MED ORDER — LACTATED RINGERS IV BOLUS (SEPSIS)
1000.0000 mL | Freq: Once | INTRAVENOUS | Status: AC
  Administered 2022-11-29: 1000 mL via INTRAVENOUS

## 2022-11-29 MED ORDER — ONDANSETRON HCL 4 MG/2ML IJ SOLN
4.0000 mg | Freq: Once | INTRAMUSCULAR | Status: AC
  Administered 2022-11-29: 4 mg via INTRAVENOUS
  Filled 2022-11-29: qty 2

## 2022-11-29 MED ORDER — HYDROCOD POLI-CHLORPHE POLI ER 10-8 MG/5ML PO SUER
5.0000 mL | Freq: Two times a day (BID) | ORAL | Status: DC | PRN
  Administered 2022-11-29 – 2022-12-01 (×3): 5 mL via ORAL
  Filled 2022-11-29 (×3): qty 5

## 2022-11-29 MED ORDER — HYDROXYZINE HCL 25 MG PO TABS: 25.0000 mg | ORAL_TABLET | Freq: Three times a day (TID) | ORAL | Status: DC | PRN

## 2022-11-29 MED ORDER — SODIUM CHLORIDE 0.9 % IV SOLN
500.0000 mg | Freq: Once | INTRAVENOUS | Status: AC
  Administered 2022-11-29: 500 mg via INTRAVENOUS
  Filled 2022-11-29: qty 5

## 2022-11-29 MED ORDER — AMPHETAMINE-DEXTROAMPHET ER 5 MG PO CP24: 10.0000 mg | ORAL_CAPSULE | Freq: Every day | ORAL | Status: DC

## 2022-11-29 MED ORDER — INSULIN PUMP
Freq: Three times a day (TID) | SUBCUTANEOUS | Status: DC
  Administered 2022-11-30: 6.5 via SUBCUTANEOUS
  Administered 2022-11-30: 3.5 via SUBCUTANEOUS
  Administered 2022-11-30: 1.1 via SUBCUTANEOUS
  Administered 2022-11-30: 7.5 via SUBCUTANEOUS
  Administered 2022-12-01: 6.75 via SUBCUTANEOUS
  Administered 2022-12-01: 7.3 via SUBCUTANEOUS
  Administered 2022-12-01: 4 via SUBCUTANEOUS
  Administered 2022-12-01: 5.2 via SUBCUTANEOUS
  Administered 2022-12-02: 4.6 via SUBCUTANEOUS
  Administered 2022-12-02: 3.2 via SUBCUTANEOUS
  Filled 2022-11-29: qty 1

## 2022-11-29 MED ORDER — LACTATED RINGERS IV SOLN: INTRAVENOUS | Status: DC

## 2022-11-29 MED ORDER — LORATADINE 10 MG PO TABS
10.0000 mg | ORAL_TABLET | Freq: Every day | ORAL | Status: DC
  Administered 2022-11-29 – 2022-12-02 (×4): 10 mg via ORAL
  Filled 2022-11-29 (×5): qty 1

## 2022-11-29 MED ORDER — FAMOTIDINE 20 MG PO TABS
20.0000 mg | ORAL_TABLET | Freq: Two times a day (BID) | ORAL | Status: DC
  Administered 2022-11-29 – 2022-12-02 (×8): 20 mg via ORAL
  Filled 2022-11-29 (×9): qty 1

## 2022-11-29 MED ORDER — SODIUM CHLORIDE 0.9 % IV SOLN
2.0000 g | Freq: Once | INTRAVENOUS | Status: AC
  Administered 2022-11-29: 2 g via INTRAVENOUS
  Filled 2022-11-29: qty 20

## 2022-11-29 MED ORDER — ENOXAPARIN SODIUM 40 MG/0.4ML IJ SOSY: 40.0000 mg | PREFILLED_SYRINGE | INTRAMUSCULAR | Status: DC

## 2022-11-29 MED ORDER — KETOROLAC TROMETHAMINE 30 MG/ML IJ SOLN
30.0000 mg | Freq: Once | INTRAMUSCULAR | Status: AC
  Administered 2022-11-29: 30 mg via INTRAVENOUS
  Filled 2022-11-29: qty 1

## 2022-11-29 NOTE — Plan of Care (Signed)
°  Problem: Education: °Goal: Knowledge of General Education information will improve °Description: Including pain rating scale, medication(s)/side effects and non-pharmacologic comfort measures °Outcome: Progressing °  °Problem: Clinical Measurements: °Goal: Diagnostic test results will improve °Outcome: Progressing °  °Problem: Clinical Measurements: °Goal: Respiratory complications will improve °Outcome: Progressing °  °

## 2022-11-29 NOTE — Plan of Care (Signed)
°  Problem: Clinical Measurements: °Goal: Diagnostic test results will improve °Outcome: Progressing °Goal: Respiratory complications will improve °Outcome: Progressing °Goal: Cardiovascular complication will be avoided °Outcome: Progressing °  °Problem: Activity: °Goal: Risk for activity intolerance will decrease °Outcome: Progressing °  °Problem: Nutrition: °Goal: Adequate nutrition will be maintained °Outcome: Progressing °  °Problem: Coping: °Goal: Level of anxiety will decrease °Outcome: Progressing °  °Problem: Elimination: °Goal: Will not experience complications related to bowel motility °Outcome: Progressing °Goal: Will not experience complications related to urinary retention °Outcome: Progressing °  °Problem: Pain Managment: °Goal: General experience of comfort will improve °Outcome: Progressing °  °Problem: Safety: °Goal: Ability to remain free from injury will improve °Outcome: Progressing °  °Problem: Skin Integrity: °Goal: Risk for impaired skin integrity will decrease °Outcome: Progressing °  °

## 2022-11-29 NOTE — ED Notes (Signed)
Advised nurse that patient has ready bed 

## 2022-11-29 NOTE — Sepsis Progress Note (Signed)
Elink following for sepsis protocol. 

## 2022-11-29 NOTE — Consult Note (Signed)
Pharmacy Antibiotic Note  Thomas Maddox. is a 22 y.o. male admitted on 11/29/2022 with worsening of cough shortness of breath and fever. Patient seen at urgent care 11/24/22  for cough/SOB started on PO antibiotics and steroids. On 11/26/22 returned to ED and was discharged on Augmentin and azithromycin  Pharmacy has been consulted for Zosyn dosing for PNA  Plan: Zosyn 3.375g IV q8h (4 hour infusion).  Height: 5\' 6"  (167.6 cm) Weight: 64 kg (141 lb 1.5 oz) IBW/kg (Calculated) : 63.8  Temp (24hrs), Avg:100 F (37.8 C), Min:100 F (37.8 C), Max:100 F (37.8 C)  Recent Labs  Lab 11/25/22 2205 11/25/22 2333 11/28/22 2346 11/29/22 0515 11/29/22 0807  WBC 5.7  --  2.5*  --   --   CREATININE 0.74  --  0.79  --   --   LATICACIDVEN  --  0.8  --  0.7 0.7    Estimated Creatinine Clearance: 128.5 mL/min (by C-G formula based on SCr of 0.79 mg/dL).    No Known Allergies  Antimicrobials this admission: 8/11 azithromycin >>  8/11 ceftriaxone x 1  8/11 Zosyn >>   Dose adjustments this admission:   Microbiology results:  8/11 MRSA PCR: ordered  Thank you for allowing pharmacy to be a part of this patient's care.  Sharen Hones, PharmD, BCPS Clinical Pharmacist   11/29/2022 8:32 AM

## 2022-11-29 NOTE — Progress Notes (Signed)
CODE SEPSIS - PHARMACY COMMUNICATION  **Broad Spectrum Antibiotics should be administered within 1 hour of Sepsis diagnosis**  Time Code Sepsis Called/Page Received: 1610  Antibiotics Ordered: Azithromycin & Ceftriaxone  Time of 1st antibiotic administration: 0531  Otelia Sergeant, PharmD, [facility] 11/29/2022 5:02 AM

## 2022-11-29 NOTE — H&P (Signed)
History and Physical    Thomas Maddox. AOZ:[id number] DOB: [birth year] DOA: 11/29/2022  PCP: Emogene Morgan, MD (Confirm with patient/family/NH records and if not entered, this has to be entered at [facility] point of entry) Patient coming from: Home  I have personally briefly reviewed patient's old medical records in [facility]  Chief Complaint: Cough, SOB, fever  HPI: Thomas Maddox. is a 22 y.o. male with medical history significant of type 1 diabetes, diabetic gastroparesis, ADHD, presented with worsening of cough shortness of breath and fever.  Patient went down a beach trip last weekend and then came back Tuesday and started to have dry cough associated with shortness of breath, went to see urgent care was diagnosed with bronchitis and sent home with p.o. antibiotics and steroid.  Thursday, patient came back to ED for worsening of dry cough and shortness of breath x-ray found LLL infiltrate suspicious for pneumonia and patient was discharged on Augmentin and azithromycin.  Over the last 3 days however patient symptoms getting worse and he also developed frequent nauseous vomiting and his suspect at least some of antibiotics have been vomited off his stomach, denies any abdominal pain, no diarrhea.  Yesterday he started to develop fever Tmax 101.0  ED Course: Temperature 100.0, borderline tachycardia pulm and tachypneic O2 saturation 98% on room air.  Chest x-ray showed worsening of LLL infiltrates suspicious for worsening of pneumonia.  WBC 2.7, creatinine 0.9, K3.5, glucose 162.  Lactic acid 0.9  Code sepsis was called, patient was given vancomycin and cefepime in the ED.  Review of Systems: As per HPI otherwise 14 point review of systems negative.    Past Medical History:  Diagnosis Date   ADHD (attention deficit hyperactivity disorder)    AKI (acute kidney injury) (HCC) 02/09/2016   Diabetes mellitus without complication (HCC)    DKA (diabetic ketoacidoses) 03/17/2015    Tachycardia 07/04/2019    Past Surgical History:  Procedure Laterality Date   ESOPHAGOGASTRODUODENOSCOPY (EGD) WITH PROPOFOL N/A 02/25/2021   Procedure: ESOPHAGOGASTRODUODENOSCOPY (EGD) WITH PROPOFOL;  Surgeon: Toney Reil, MD;  Location: [facility] ENDOSCOPY;  Service: Gastroenterology;  Laterality: N/A;   NO PAST SURGERIES       reports that he has never smoked. He has never used smokeless tobacco. He reports that he does not drink alcohol and does not use drugs.  No Known Allergies  Family History  Problem Relation Age of Onset   Hypertension Other      Prior to Admission medications   Medication Sig Start Date End Date Taking? Authorizing Provider  albuterol (VENTOLIN HFA) 108 (90 Base) MCG/ACT inhaler Inhale 2 puffs into the lungs every 4 (four) hours as needed for wheezing or shortness of breath. 11/26/22  Yes Irean Hong, MD  amoxicillin-clavulanate (AUGMENTIN) 875-125 MG tablet Take 1 tablet by mouth 2 (two) times daily. 11/26/22  Yes Irean Hong, MD  amphetamine-dextroamphetamine (ADDERALL XR) 10 MG 24 hr capsule Take 10 mg by mouth every morning.    Yes [provider]  amphetamine-dextroamphetamine (ADDERALL) 10 MG tablet Take 10 mg by mouth daily at 2 PM.    Yes [provider]  cetirizine (ZYRTEC) 10 MG tablet Take 10 mg by mouth daily as needed.   Yes [provider]  chlorpheniramine-HYDROcodone (TUSSIONEX) 10-8 MG/5ML Take 5 mLs by mouth every 12 (twelve) hours as needed for cough. 11/26/22  Yes Irean Hong, MD  famotidine (PEPCID) 20 MG tablet Take 1 tablet (20 mg total)  by mouth 2 (two) times daily. 09/17/21  Yes Sharman Cheek, MD  HUMALOG 100 UNIT/ML injection 100 units daily to be used in in the patients insulin pump 07/03/22  Yes [provider]  hydrOXYzine (ATARAX) 25 MG tablet Take 1 tablet (25 mg total) by mouth 3 (three) times daily as needed for anxiety. 09/14/21  Yes Minna Antis, MD  metoCLOPramide (REGLAN) 10 MG  tablet Take 1 tablet (10 mg total) by mouth every 6 (six) hours as needed. 09/17/21  Yes Sharman Cheek, MD  naproxen (NAPROSYN) 500 MG tablet Take 1 tablet (500 mg total) by mouth 2 (two) times daily with a meal. Patient taking differently: Take 500 mg by mouth 2 (two) times daily as needed. 09/17/21  Yes Sharman Cheek, MD  promethazine (PHENERGAN) 25 MG tablet Take 1 tablet (25 mg total) by mouth every 6 (six) hours as needed for nausea or vomiting. 07/30/21  Yes Willy Eddy, MD  azithromycin (ZITHROMAX) 250 MG tablet Take by mouth. Patient not taking: Reported on 11/29/2022 11/25/22   [provider]  blood glucose meter kit and supplies KIT Dispense based on patient and insurance preference. Use up to four times daily as directed. (FOR ICD-9 250.00, 250.01). 07/04/19   Zigmund Daniel., MD  insulin aspart (NOVOLOG) 100 UNIT/ML FlexPen Inject 8 Units into the skin 3 (three) times daily with meals. Patient not taking: Reported on 11/29/2022 07/04/19   Zigmund Daniel., MD  insulin degludec (TRESIBA) 100 UNIT/ML FlexTouch Pen Inject into the skin. Patient not taking: Reported on 02/25/2021 02/03/16   [provider]  Insulin Pen Needle 32G X 4 MM MISC Use with insulin 07/04/19   Zigmund Daniel., MD  NOVOLOG MIX 70/30 FLEXPEN (70-30) 100 UNIT/ML FlexPen Inject into the skin. Patient not taking: Reported on 11/29/2022 01/24/21   [provider]    Physical Exam: Vitals:   11/29/22 0630 11/29/22 0645 11/29/22 0700 11/29/22 0901  BP: 100/63  100/72 109/75  Pulse: 90 88 87 83  Resp: 17 (!) 24 (!) 21 18  Temp:    98.3 F (36.8 C)  TempSrc:      SpO2:    100%  Weight:      Height:        Constitutional: NAD, calm, comfortable Vitals:   11/29/22 0630 11/29/22 0645 11/29/22 0700 11/29/22 0901  BP: 100/63  100/72 109/75  Pulse: 90 88 87 83  Resp: 17 (!) 24 (!) 21 18  Temp:    98.3 F (36.8 C)  TempSrc:      SpO2:    100%  Weight:       Height:       Eyes: PERRL, lids and conjunctivae normal ENMT: Mucous membranes are moist. Posterior pharynx clear of any exudate or lesions.Normal dentition.  Neck: normal, supple, no masses, no thyromegaly Respiratory: clear to auscultation bilaterally, no wheezing, coarse crackles on the left lower field with increasing respiratory effort. No accessory muscle use.  Cardiovascular: Regular rate and rhythm, no murmurs / rubs / gallops. No extremity edema. 2+ pedal pulses. No carotid bruits.  Abdomen: no tenderness, no masses palpated. No hepatosplenomegaly. Bowel sounds positive.  Musculoskeletal: no clubbing / cyanosis. No joint deformity upper and lower extremities. Good ROM, no contractures. Normal muscle tone.  Skin: no rashes, lesions, ulcers. No induration Neurologic: CN 2-12 grossly intact. Sensation intact, DTR normal. Strength 5/5 in all 4.  Psychiatric: Normal judgment and insight. Alert and oriented x 3. Normal mood.  Labs on Admission: I have personally reviewed following labs and imaging studies  CBC: Recent Labs  Lab 11/25/22 2205 11/28/22 2346  WBC 5.7 2.5*  NEUTROABS 3.6  --   HGB 12.2* 11.1*  HCT 37.0* 34.0*  MCV 81.9 81.0  PLT 128* 166   Basic Metabolic Panel: Recent Labs  Lab 11/25/22 2205 11/28/22 2346  NA 135 135  K 3.7 3.5  CL 106 102  CO2 23 24  GLUCOSE 160* 162*  BUN 8 <5*  CREATININE 0.74 0.79  CALCIUM 8.0* 7.8*   GFR: Estimated Creatinine Clearance: 128.5 mL/min (by C-G formula based on SCr of 0.79 mg/dL). Liver Function Tests: Recent Labs  Lab 11/25/22 2205 11/28/22 2346  AST 18 19  ALT 11 13  ALKPHOS 55 53  BILITOT 0.4 0.6  PROT 7.5 7.3  ALBUMIN 2.8* 2.6*   Recent Labs  Lab 11/28/22 2346  LIPASE 26   No results for input(s): "AMMONIA" in the last 168 hours. Coagulation Profile: No results for input(s): "INR", "PROTIME" in the last 168 hours. Cardiac Enzymes: No results for input(s): "CKTOTAL", "CKMB", "CKMBINDEX",  "TROPONINI" in the last 168 hours. BNP (last 3 results) No results for input(s): "PROBNP" in the last 8760 hours. HbA1C: No results for input(s): "HGBA1C" in the last 72 hours. CBG: Recent Labs  Lab 11/28/22 2344  GLUCAP 157*   Lipid Profile: No results for input(s): "CHOL", "HDL", "LDLCALC", "TRIG", "CHOLHDL", "LDLDIRECT" in the last 72 hours. Thyroid Function Tests: No results for input(s): "TSH", "T4TOTAL", "FREET4", "T3FREE", "THYROIDAB" in the last 72 hours. Anemia Panel: No results for input(s): "VITAMINB12", "FOLATE", "FERRITIN", "TIBC", "IRON", "RETICCTPCT" in the last 72 hours. Urine analysis:    Component Value Date/Time   COLORURINE YELLOW (A) 11/28/2022 2345   APPEARANCEUR CLEAR (A) 11/28/2022 2345   APPEARANCEUR Clear 05/06/2013 1847   LABSPEC 1.009 11/28/2022 2345   LABSPEC 1.024 05/06/2013 1847   PHURINE 7.0 11/28/2022 2345   GLUCOSEU NEGATIVE 11/28/2022 2345   GLUCOSEU >=500 05/06/2013 1847   HGBUR NEGATIVE 11/28/2022 2345   BILIRUBINUR NEGATIVE 11/28/2022 2345   BILIRUBINUR Negative 05/06/2013 1847   KETONESUR NEGATIVE 11/28/2022 2345   PROTEINUR 100 (A) 11/28/2022 2345   NITRITE NEGATIVE 11/28/2022 2345   LEUKOCYTESUR NEGATIVE 11/28/2022 2345   LEUKOCYTESUR Negative 05/06/2013 1847    Radiological Exams on Admission: DG Chest Portable 1 View  Result Date: 11/29/2022 CLINICAL DATA:  22 year old male with history of shortness of breath. Recent diagnosis of pneumonia. Worsening symptoms. EXAM: PORTABLE CHEST 1 VIEW COMPARISON:  Chest x-ray 11/25/2022. FINDINGS: Low lung volumes are again noted. Increasing opacity in the left base which may reflect worsening atelectasis and/or consolidation. Mild generalized interstitial prominence and some patchy peribronchial cuffing, most evident in the left mid to lower lung. No definite no definite pleural effusions. No pneumothorax. No evidence of pulmonary edema. Heart size is normal. Upper mediastinal contours are within  normal limits. IMPRESSION: 1. Worsening aeration in the left base, compatible with increasing areas of atelectasis and/or consolidation. Overall appearance suggests progressive bronchopneumonia in this region. Followup PA and lateral chest X-ray is recommended in 3-4 weeks following trial of antibiotic therapy to ensure resolution and exclude underlying malignancy. Electronically Signed   By: Trudie Reed M.D.   On: 11/29/2022 06:00    EKG: Independently reviewed.  Sinus rhythm, no acute ST changes.  Assessment/Plan Principal Problem:   Sepsis due to pneumonia [facility]) Active Problems:   Aspiration pneumonia (HCC)  (please populate well all problems here in Problem List. (  For example, if patient is on BP meds at home and you resume or decide to hold them, it is a problem that needs to be her. Same for CAD, COPD, HLD and so on)  Sepsis -Evidenced by fever, leukopenia, source of infection is worsening of LLL bacterial pneumonia failed outpatient management versus possibility of element of aspiration pneumonia -Escalate antibiotics to Zosyn and continue azithromycin -Check atypical pneumonia study Legionella and mycoplasma, check strep antigen -Breathing treatment -Check HIV  IIDM -Continue insulin pump as at home  Diabetic gastroparesis -As needed Zofran  ADHD -Continue Adderall  DVT prophylaxis: Lovenox Code Status: Full code Family Communication: Significant other at bedside Disposition Plan: Patient is sick with sepsis pneumonia failed outpatient management requiring IV antibiotics and sepsis workup, expect more than 2 midnight hospital stay Consults called: None Admission status: Telemetry admission   Emeline General MD [facility] Pager [phone]  11/29/2022, 10:09 AM

## 2022-11-29 NOTE — ED Provider Notes (Signed)
[facility] Provider Note    Event Date/Time   First MD Initiated Contact with Patient 11/29/22 [phone]     (approximate)   History   Emesis   HPI  Thomas Sharrett. is a 22 y.o. male with type 1 diabetes, ADHD who presents to the emergency department with worsening shortness of breath, nausea and vomiting.  Was diagnosed with pneumonia on 8/7 and has been taking Augmentin in addition to prednisone and azithromycin that he was taking prior to this date.  States symptoms of shortness of breath, cough, chest tightness are getting worse and he is now vomiting and unable to keep down his medications.  No diarrhea.  No abdominal pain.   History provided by patient.    Past Medical History:  Diagnosis Date   ADHD (attention deficit hyperactivity disorder)    AKI (acute kidney injury) (HCC) 02/09/2016   Diabetes mellitus without complication (HCC)    DKA (diabetic ketoacidoses) 03/17/2015   Tachycardia 07/04/2019    Past Surgical History:  Procedure Laterality Date   ESOPHAGOGASTRODUODENOSCOPY (EGD) WITH PROPOFOL N/A 02/25/2021   Procedure: ESOPHAGOGASTRODUODENOSCOPY (EGD) WITH PROPOFOL;  Surgeon: Toney Reil, MD;  Location: [facility] ENDOSCOPY;  Service: Gastroenterology;  Laterality: N/A;   NO PAST SURGERIES      MEDICATIONS:  Prior to Admission medications   Medication Sig Start Date End Date Taking? Authorizing Provider  albuterol (VENTOLIN HFA) 108 (90 Base) MCG/ACT inhaler Inhale 2 puffs into the lungs every 4 (four) hours as needed for wheezing or shortness of breath. 11/26/22   Irean Hong, MD  amoxicillin-clavulanate (AUGMENTIN) 875-125 MG tablet Take 1 tablet by mouth 2 (two) times daily. 11/26/22   Irean Hong, MD  amphetamine-dextroamphetamine (ADDERALL XR) 10 MG 24 hr capsule Take 10 mg by mouth every morning.     [provider]  amphetamine-dextroamphetamine (ADDERALL) 10 MG tablet Take 10 mg by mouth daily at 2 PM.     [provider]  blood glucose meter kit and supplies KIT Dispense based on patient and insurance preference. Use up to four times daily as directed. (FOR ICD-9 250.00, 250.01). 07/04/19   Zigmund Daniel., MD  cetirizine (ZYRTEC) 10 MG tablet Take by mouth.    [provider]  chlorpheniramine-HYDROcodone (TUSSIONEX) 10-8 MG/5ML Take 5 mLs by mouth every 12 (twelve) hours as needed for cough. 11/26/22   Irean Hong, MD  famotidine (PEPCID) 20 MG tablet Take 1 tablet (20 mg total) by mouth 2 (two) times daily. 09/17/21   Sharman Cheek, MD  hydrOXYzine (ATARAX) 25 MG tablet Take 1 tablet (25 mg total) by mouth 3 (three) times daily as needed for anxiety. 09/14/21   Minna Antis, MD  hydrOXYzine (VISTARIL) 25 MG capsule Take 25 mg by mouth 3 (three) times daily as needed. 01/21/21   [provider]  insulin aspart (NOVOLOG) 100 UNIT/ML FlexPen Inject 8 Units into the skin 3 (three) times daily with meals. 07/04/19   Zigmund Daniel., MD  insulin degludec (TRESIBA) 100 UNIT/ML FlexTouch Pen Inject into the skin. Patient not taking: Reported on 02/25/2021 02/03/16   [provider]  Insulin Pen Needle 32G X 4 MM MISC Use with insulin 07/04/19   Zigmund Daniel., MD  metoCLOPramide (REGLAN) 10 MG tablet Take 1 tablet (10 mg total) by mouth every 6 (six) hours as needed. 09/17/21   Sharman Cheek, MD  naproxen (NAPROSYN) 500 MG tablet Take 1 tablet (500 mg  total) by mouth 2 (two) times daily with a meal. 09/17/21   Sharman Cheek, MD  NOVOLOG MIX 70/30 FLEXPEN (70-30) 100 UNIT/ML FlexPen Inject into the skin. 01/24/21   [provider]  omeprazole (PRILOSEC) 40 MG capsule Take 1 capsule (40 mg total) by mouth 2 (two) times daily before a meal. 02/25/21 03/27/21  Vanga, Loel Dubonnet, MD  promethazine (PHENERGAN) 25 MG tablet Take 1 tablet (25 mg total) by mouth every 6 (six) hours as needed for nausea or vomiting. 07/30/21   Willy Eddy, MD     Physical Exam   Triage Vital Signs: ED Triage Vitals  Encounter Vitals Group     BP 11/28/22 2341 121/83     Systolic BP Percentile --      Diastolic BP Percentile --      Pulse Rate 11/28/22 2341 (!) 118     Resp 11/28/22 2345 18     Temp 11/28/22 2341 100 F (37.8 C)     Temp Source 11/28/22 2341 Oral     SpO2 11/28/22 2341 93 %     Weight 11/28/22 2344 141 lb 1.5 oz (64 kg)     Height 11/28/22 2344 5\' 6"  (1.676 m)     Head Circumference --      Peak Flow --      Pain Score 11/28/22 2344 0     Pain Loc --      Pain Education --      Exclude from Growth Chart --     Most recent vital signs: Vitals:   11/29/22 0645 11/29/22 0700  BP:  100/72  Pulse: 88 87  Resp: (!) 24 (!) 21  Temp:    SpO2:      CONSTITUTIONAL: Alert, responds appropriately to questions. Well-appearing; well-nourished HEAD: Normocephalic, atraumatic EYES: Conjunctivae clear, pupils appear equal, sclera nonicteric ENT: normal nose; moist mucous membranes NECK: Supple, normal ROM CARD: Regular and tachycardic; S1 and S2 appreciated RESP: Normal chest excursion without splinting or tachypnea; breath sounds clear and equal bilaterally; no wheezes, no rhonchi, no rales, no hypoxia or respiratory distress, speaking full sentences ABD/GI: Non-distended; soft, non-tender, no rebound, no guarding, no peritoneal signs BACK: The back appears normal EXT: Normal ROM in all joints; no deformity noted, no edema SKIN: Normal color for age and race; warm; no rash on exposed skin NEURO: Moves all extremities equally, normal speech PSYCH: The patient's mood and manner are appropriate.   ED Results / Procedures / Treatments   LABS: (all labs ordered are listed, but only abnormal results are displayed) Labs Reviewed  COMPREHENSIVE METABOLIC PANEL - Abnormal; Notable for the following components:      Result Value   Glucose, Bld 162 (*)    BUN <5 (*)    Calcium 7.8 (*)    Albumin 2.6 (*)    All other  components within normal limits  CBC - Abnormal; Notable for the following components:   WBC 2.5 (*)    RBC 4.20 (*)    Hemoglobin 11.1 (*)    HCT 34.0 (*)    All other components within normal limits  URINALYSIS, ROUTINE W REFLEX MICROSCOPIC - Abnormal; Notable for the following components:   Color, Urine YELLOW (*)    APPearance CLEAR (*)    Protein, ur 100 (*)    Bacteria, UA RARE (*)    All other components within normal limits  CBG MONITORING, ED - Abnormal; Notable for the following components:   Glucose-Capillary 157 (*)  All other components within normal limits  CULTURE, BLOOD (ROUTINE X 2)  CULTURE, BLOOD (ROUTINE X 2)  LIPASE, BLOOD  LACTIC ACID, PLASMA  LACTIC ACID, PLASMA  CBG MONITORING, ED     EKG:   Date: 11/29/2022  Rate: 105  Rhythm: Sinus tachycardia  QRS Axis: normal  Intervals: normal  ST/T Wave abnormalities: normal  Conduction Disutrbances: none  Narrative Interpretation: Sinus tachycardia     RADIOLOGY: My personal review and interpretation of imaging: Chest x-ray shows worsening pneumonia.  I have personally reviewed all radiology reports.   DG Chest Portable 1 View  Result Date: 11/29/2022 CLINICAL DATA:  22 year old male with history of shortness of breath. Recent diagnosis of pneumonia. Worsening symptoms. EXAM: PORTABLE CHEST 1 VIEW COMPARISON:  Chest x-ray 11/25/2022. FINDINGS: Low lung volumes are again noted. Increasing opacity in the left base which may reflect worsening atelectasis and/or consolidation. Mild generalized interstitial prominence and some patchy peribronchial cuffing, most evident in the left mid to lower lung. No definite no definite pleural effusions. No pneumothorax. No evidence of pulmonary edema. Heart size is normal. Upper mediastinal contours are within normal limits. IMPRESSION: 1. Worsening aeration in the left base, compatible with increasing areas of atelectasis and/or consolidation. Overall appearance suggests  progressive bronchopneumonia in this region. Followup PA and lateral chest X-ray is recommended in 3-4 weeks following trial of antibiotic therapy to ensure resolution and exclude underlying malignancy. Electronically Signed   By: Trudie Reed M.D.   On: 11/29/2022 06:00     PROCEDURES:  Critical Care performed: Yes, see critical care procedure note(s)   CRITICAL CARE Performed by: Baxter Hire    Total critical care time: 40 minutes  Critical care time was exclusive of separately billable procedures and treating other patients.  Critical care was necessary to treat or prevent imminent or life-threatening deterioration.  Critical care was time spent personally by me on the following activities: development of treatment plan with patient and/or surrogate as well as nursing, discussions with consultants, evaluation of patient's response to treatment, examination of patient, obtaining history from patient or surrogate, ordering and performing treatments and interventions, ordering and review of laboratory studies, ordering and review of radiographic studies, pulse oximetry and re-evaluation of patient's condition.   Marland Kitchen1-3 Lead EKG Interpretation  Performed by: , Layla Maw, DO Authorized by: , Layla Maw, DO     Interpretation: abnormal     ECG rate:  118   ECG rate assessment: tachycardic     Rhythm: sinus tachycardia     Ectopy: none     Conduction: normal       IMPRESSION / MDM / ASSESSMENT AND PLAN / ED COURSE  I reviewed the triage vital signs and the nursing notes.    Patient here with worsening chest pain, shortness of breath, cough and vomiting.  Recently diagnosed with pneumonia and has been on antibiotics.  The patient is on the cardiac monitor to evaluate for evidence of arrhythmia and/or significant heart rate changes.   DIFFERENTIAL DIAGNOSIS (includes but not limited to):   Worsening pneumonia, sepsis, viral illness, PE, DKA   Patient's presentation  is most consistent with acute presentation with potential threat to life or bodily function.   PLAN: Workup initiated from triage.  Patient has a leukopenia of 2.5.  Normal lactic.  Urine shows no infection.  Blood glucose 157 without DKA.  Chest x-ray reviewed and interpreted by myself and the radiologist and shows worsening aeration at the left lung base consistent with increasing  pneumonia.  Given patient is failing outpatient antibiotics, will initiate IV antibiotics and give 30 mL/kg IV fluid bolus.  Patient will need admission to the hospital.   Sepsis - Repeat Assessment  Performed at:    7:30 AM  Vitals     Blood pressure 100/72, pulse 87, temperature 100 F (37.8 C), temperature source Oral, resp. rate (!) 21, height 5\' 6"  (1.676 m), weight 64 kg, SpO2 98%.  Heart:     Regular rate and rhythm  Lungs:    CTA  Capillary Refill:   <2 sec  Peripheral Pulse:   Radial pulse palpable  Skin:     Normal Color     MEDICATIONS GIVEN IN ED: Medications  lactated ringers infusion (has no administration in time range)  cefTRIAXone (ROCEPHIN) 2 g in sodium chloride 0.9 % 100 mL IVPB (0 g Intravenous Stopped 11/29/22 0607)  azithromycin (ZITHROMAX) 500 mg in sodium chloride 0.9 % 250 mL IVPB (500 mg Intravenous New Bag/Given 11/29/22 0612)  lactated ringers bolus 1,000 mL (1,000 mLs Intravenous New Bag/Given 11/29/22 0519)    And  lactated ringers bolus 1,000 mL (1,000 mLs Intravenous New Bag/Given 11/29/22 0521)  ondansetron (ZOFRAN) injection 4 mg (4 mg Intravenous Given 11/29/22 0532)  ketorolac (TORADOL) 30 MG/ML injection 30 mg (30 mg Intravenous Given 11/29/22 0532)     ED COURSE:  Consulted and discussed patient's case with hospitalist, Dr. Arville Care.  I have recommended admission and consulting physician agrees and will place admission orders.  Patient (and family if present) agree with this plan.   I reviewed all nursing notes, vitals, pertinent previous records.  All labs, EKGs,  imaging ordered have been independently reviewed and interpreted by myself.    CONSULTS: Admitted to the hospitalist service.   OUTSIDE RECORDS REVIEWED: Reviewed previous endocrinology notes at [facility].       FINAL CLINICAL IMPRESSION(S) / ED DIAGNOSES   Final diagnoses:  Bronchopneumonia  Nausea and vomiting in adult     Rx / DC Orders   ED Discharge Orders     None        Note:  This document was prepared using Dragon voice recognition software and may include unintentional dictation errors.   , Layla Maw, DO 11/29/22 [id number]

## 2022-11-29 NOTE — ED Notes (Signed)
Patient c/o dizziness, lightheadedness, chest discomfort, and shortness of breath. Patient states he begin vomiting around 2100 last night.

## 2022-11-30 DIAGNOSIS — J189 Pneumonia, unspecified organism: Secondary | ICD-10-CM | POA: Diagnosis not present

## 2022-11-30 DIAGNOSIS — A419 Sepsis, unspecified organism: Secondary | ICD-10-CM | POA: Diagnosis not present

## 2022-11-30 DIAGNOSIS — J181 Lobar pneumonia, unspecified organism: Secondary | ICD-10-CM | POA: Diagnosis not present

## 2022-11-30 DIAGNOSIS — E1143 Type 2 diabetes mellitus with diabetic autonomic (poly)neuropathy: Secondary | ICD-10-CM

## 2022-11-30 DIAGNOSIS — E109 Type 1 diabetes mellitus without complications: Secondary | ICD-10-CM

## 2022-11-30 DIAGNOSIS — K3184 Gastroparesis: Secondary | ICD-10-CM | POA: Diagnosis not present

## 2022-11-30 DIAGNOSIS — E1043 Type 1 diabetes mellitus with diabetic autonomic (poly)neuropathy: Secondary | ICD-10-CM

## 2022-11-30 DIAGNOSIS — Z21 Asymptomatic human immunodeficiency virus [HIV] infection status: Secondary | ICD-10-CM | POA: Diagnosis present

## 2022-11-30 LAB — RESPIRATORY PANEL BY PCR

## 2022-11-30 LAB — GLUCOSE, CAPILLARY
Glucose-Capillary: 130 mg/dL — ABNORMAL HIGH (ref 70–99)
Glucose-Capillary: 134 mg/dL — ABNORMAL HIGH (ref 70–99)
Glucose-Capillary: 276 mg/dL — ABNORMAL HIGH (ref 70–99)
Glucose-Capillary: 304 mg/dL — ABNORMAL HIGH (ref 70–99)
Glucose-Capillary: 242 mg/dL — ABNORMAL HIGH (ref 70–99)

## 2022-11-30 LAB — LACTATE DEHYDROGENASE: LDH: 159 U/L (ref 98–192)

## 2022-11-30 LAB — PROCALCITONIN: Procalcitonin: <0.1 ng/mL

## 2022-11-30 MED ORDER — DM-GUAIFENESIN ER 30-600 MG PO TB12
1.0000 | ORAL_TABLET | Freq: Two times a day (BID) | ORAL | Status: DC
  Administered 2022-11-30 – 2022-12-02 (×6): 1 via ORAL
  Filled 2022-11-30 (×8): qty 1

## 2022-11-30 MED ORDER — ONDANSETRON HCL 4 MG/2ML IJ SOLN
4.0000 mg | Freq: Four times a day (QID) | INTRAMUSCULAR | Status: DC | PRN
  Administered 2022-11-30 – 2022-12-01 (×2): 4 mg via INTRAVENOUS
  Filled 2022-11-30 (×2): qty 2

## 2022-11-30 MED ORDER — ADULT MULTIVITAMIN W/MINERALS CH
1.0000 | ORAL_TABLET | Freq: Every day | ORAL | Status: DC
  Administered 2022-11-30 – 2022-12-02 (×3): 1 via ORAL
  Filled 2022-11-30 (×4): qty 1

## 2022-11-30 MED ORDER — GLUCERNA SHAKE PO LIQD
237.0000 mL | Freq: Three times a day (TID) | ORAL | Status: DC
  Administered 2022-12-01: 237 mL via ORAL

## 2022-11-30 NOTE — Assessment & Plan Note (Addendum)
Sepsis POA as evidenced by fever, leukopenia, source of infection is worsening of LLL pneumonia failed outpatient management with PO abx. --Initially on IV Zosyn, Zithromax --Zosyn dc'd per ID, low suspicion for bacterial process --Attempt sputum culture if able to collct --CT chest w/o contrast to further assess CXR findings, given prolonged course of illness at this point --Mucinex scheduled, PRN Tussionex --Flutter valve --Supplement O2 if sats < 90% on room air

## 2022-11-30 NOTE — TOC CM/SW Note (Signed)
Transition of Care [facility]) - Inpatient Brief Assessment   Patient Details  Name: Thomas Maddox. MRN: [id number] Date of Birth: 2000/12/12  Transition of Care [facility]) CM/SW Contact:    Garret Reddish, RN Phone Number: 11/30/2022, 1:58 PM   Clinical Narrative:  Chart reviewed.  Noted that patient was admitted with Sepsis due to Pneumonia.  Patient is currently on IV  Zosyn and azithromycin.  Noted TOC needs at this time. Will follow for any discharge needs.    Transition of Care Asessment: Insurance and Status: Insurance coverage has been reviewed Patient has primary care physician: Yes (Aycock, Ngwe A) Home environment has been reviewed: reviewed Prior level of function:: Independent Prior/Current Home Services: No current home services Social Determinants of Health Reivew: SDOH reviewed no interventions necessary Readmission risk has been reviewed: Yes Transition of care needs: no transition of care needs at this time

## 2022-11-30 NOTE — Consult Note (Signed)
Pharmacy Antibiotic Note  Thomas Maddox. is a 22 y.o. male admitted on 11/29/2022 with worsening of cough shortness of breath and fever. Patient seen at urgent care 11/24/22 for cough/SOB started on PO antibiotics and steroids. On 11/26/22 returned to ED and was discharged on Augmentin and azithromycin. Pharmacy has been consulted for Zosyn dosing for PNA.   Plan: Continue Zosyn 3.375 g IV Q8H Pharmacy will continue to monitor and dose adjust accordingly   Height: 5\' 6"  (167.6 cm) Weight: 64 kg (141 lb 1.5 oz) IBW/kg (Calculated) : 63.8  Temp (24hrs), Avg:98.1 F (36.7 C), Min:98 F (36.7 C), Max:98.3 F (36.8 C)  Recent Labs  Lab 11/25/22 2205 11/25/22 2333 11/28/22 2346 11/29/22 0515 11/29/22 0807 11/30/22 0350  WBC 5.7  --  2.5*  --   --  2.7*  CREATININE 0.74  --  0.79  --   --   --   LATICACIDVEN  --  0.8  --  0.7 0.7  --     Estimated Creatinine Clearance: 128.5 mL/min (by C-G formula based on SCr of 0.79 mg/dL).    No Known Allergies  Antimicrobials this admission: 8/11 ceftriaxone x 1  8/11 Zosyn >> 8/11 azithromycin >>   Dose adjustments this admission:  Microbiology results: 8/11 MRSA PCR: ordered 8/11 BCx: NGTD  Thank you for allowing pharmacy to be a part of this patient's care.  Littie Deeds, PharmD PGY1 Pharmacy Resident   11/30/2022 10:27 AM

## 2022-11-30 NOTE — Progress Notes (Signed)
Progress Note   Patient: Thomas Maddox. ZOX:[id number] DOB: 2001/02/25 DOA: 11/29/2022     1 DOS: the patient was seen and examined on 11/30/2022   Brief hospital course: HPI on admission 11/29/22 by Dr. Chipper Herb: "Derinda Sis Bergen Giampapa. is a 22 y.o. male with medical history significant of type 1 diabetes, diabetic gastroparesis, ADHD, presented with worsening of cough shortness of breath and fever.   Patient went down a beach trip last weekend and then came back Tuesday and started to have dry cough associated with shortness of breath, went to see urgent care was diagnosed with bronchitis and sent home with p.o. antibiotics and steroid.  Thursday, patient came back to ED for worsening of dry cough and shortness of breath x-ray found LLL infiltrate suspicious for pneumonia and patient was discharged on Augmentin and azithromycin.  Over the last 3 days however patient symptoms getting worse and he also developed frequent nauseous vomiting and his suspect at least some of antibiotics have been vomited off his stomach, denies any abdominal pain, no diarrhea.  Yesterday he started to develop fever Tmax 101.0   ED Course: Temperature 100.0, borderline tachycardia pulm and tachypneic O2 saturation 98% on room air.  Chest x-ray showed worsening of LLL infiltrates suspicious for worsening of pneumonia.  WBC 2.7, creatinine 0.9, K3.5, glucose 162.  Lactic acid 0.9   Code sepsis was called, patient was given vancomycin and cefepime in the ED."  Patient was admitted to the hospital and continued on IV antibiotics with Zosyn and Zithromax.  Further hospital course and management as outlined below.  Assessment and Plan: * Sepsis due to pneumonia (HCC) Sepsis POA as evidenced by fever, leukopenia, source of infection is worsening of LLL pneumonia failed outpatient management with PO abx. 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 --Infectious disease consulted, appreciate input --This raises possibility for opportunistic infection as cause of pneumonia.  Further evaluation as per ID --Follow pending HIV 1/2 Ab differentiation --Outpatient ID follow up  Type 1 diabetes mellitus without complication (HCC) Continue home insulin pump  ADHD Continue home Adderall        Subjective: Pt awake sitting up in bed having breakfast when seen this AM.  He reports ongoing cough, difficulty producing sputum.  No chest pain or fever/chills.  Reports intermittent nausea chronically, developed nausea after breakfast later this AM.     Physical Exam: Vitals:   11/29/22 0901 11/29/22 1632 11/29/22 2350 11/30/22 0748  BP: 109/75 (!) 125/91 112/76 106/77  Pulse: 83 70 86 77  Resp: 18 18 17 18   Temp: 98.3 F (36.8 C) 98.3 F (36.8 C) 98.1 F (36.7 C) 98 F (36.7 C)  TempSrc:      SpO2: 100% 99% 98% 98%  Weight:      Height:       General exam: awake, alert, no acute distress HEENT: moist mucus membranes, hearing grossly normal  Respiratory system: diminished breath sounds worse on left, no wheezes or rhonchi, normal respiratory effort at rest, on room air, deep inspirations trigger coughing. Cardiovascular system: normal S1/S2, RRR, no JVD, murmurs, rubs, gallops, no pedal edema.   Gastrointestinal system: soft, NT, ND, no HSM felt, +bowel sounds. Central nervous  system: A&O x 4. no gross focal neurologic deficits, normal speech Extremities: moves all, no edema, normal tone Skin: dry, intact, normal temperature, normal color, No rashes, lesions or ulcers Psychiatry: normal mood, congruent affect, judgement and insight appear normal  Data Reviewed:  Notable labs --- WBC  2.7, Hbg 9.5, platelets 120k HIV screen - reactive  Family Communication: None present on rounds. Pt is able to update.  Disposition: Status is: Inpatient Remains inpatient appropriate because: Severity of illness, remains on IV antibiotics pending further improvement. Ongoing evaluation   Planned Discharge Destination: Home    Time spent: 46 minutes  Author: Pennie Banter, DO 11/30/2022 12:57 PM  For on call review [email or web address].

## 2022-11-30 NOTE — Assessment & Plan Note (Signed)
See sepsis 

## 2022-11-30 NOTE — Consult Note (Signed)
NAME: Thomas Maddox.  DOB: [birth year]  MRN: [id number]  Date/Time: 11/30/2022 2:12 PM  REQUESTING PROVIDER: Dr.Griffith Subjective:  REASON FOR CONSULT: Preliminary HIV test positive ? Thomas Maddox. is a 22 y.o. with a history of DM TYPE 1, gastroparesis presenting with sob, cough for 2 weeks duration with couple of visits to ED/urgicare before  11/28/22  BP 121/83  Temp 100 F (37.8 C)  Pulse Rate 118 !  Resp 18  SpO2 93 %  Weight 141 lb 1.5 oz    Latest Reference Range & Units 11/28/22  WBC 4.0 - 10.5 K/uL 2.5 (L)  Hemoglobin 13.0 - 17.0 g/dL 02.7 (L)  HCT 25.3 - 66.4 % 34.0 (L)  Platelets 150 - 400 K/uL 166  Creatinine 0.61 - 1.24 mg/dL 4.03  CXR revealed left lower lobe infitrate- pt had already taken azithromycin and amoxicillin as OP and he was started on Iv ceftriaxone and azithro and the former switched to zosyn I am asked to see him as preliminary Hiv test is positive He has a male partner In feb 2024 he was treated for GC/Chl infection His partner was in the room- They both tested negative for Hiv in feb 2024. Since then been in a monogamous relationship Has DM , which was not well controlled and now has insulin pump and CGM He has lost 10-15 pounds He vapes   Past Medical History:  Diagnosis Date   ADHD (attention deficit hyperactivity disorder)    AKI (acute kidney injury) (HCC) 02/09/2016   Diabetes mellitus without complication (HCC)    DKA (diabetic ketoacidoses) 03/17/2015   Tachycardia 07/04/2019    Past Surgical History:  Procedure Laterality Date   ESOPHAGOGASTRODUODENOSCOPY (EGD) WITH PROPOFOL N/A 02/25/2021   Procedure: ESOPHAGOGASTRODUODENOSCOPY (EGD) WITH PROPOFOL;  Surgeon: Toney Reil, MD;  Location: [facility] ENDOSCOPY;  Service: Gastroenterology;  Laterality: N/A;   NO PAST SURGERIES      Social History   Socioeconomic History   Marital status: Single    Spouse name: Not on file   Number of children: Not on file   Years of  education: Not on file   Highest education level: Not on file  Occupational History   Not on file  Tobacco Use   Smoking status: Never   Smokeless tobacco: Never  Vaping Use   Vaping status: Never Used  Substance and Sexual Activity   Alcohol use: No   Drug use: No   Sexual activity: Not on file  Other Topics Concern   Not on file  Social History Narrative   Not on file   Social Determinants of Health   Financial Resource Strain: Not on file  Food Insecurity: No Food Insecurity (11/29/2022)   Hunger Vital Sign    Worried About Running Out of Food in the Last Year: Never true    Ran Out of Food in the Last Year: Never true  Transportation Needs: No Transportation Needs (11/29/2022)   PRAPARE - Administrator, Civil Service (Medical): No    Lack of Transportation (Non-Medical): No  Physical Activity: Not on file  Stress: Not on file  Social Connections: Not on file  Intimate Partner Violence: Not At Risk (11/29/2022)   Humiliation, Afraid, Rape, and Kick questionnaire    Fear of Current or Ex-Partner: No    Emotionally Abused: No    Physically Abused: No    Sexually Abused: No    Family History  Problem Relation Age of Onset  Hypertension Other    No Known Allergies I? Current Facility-Administered Medications  Medication Dose Route Frequency Provider Last Rate Last Admin   albuterol (PROVENTIL) (2.5 MG/3ML) 0.083% nebulizer solution 3 mL  3 mL Inhalation Q4H PRN Mikey College T, MD       amphetamine-dextroamphetamine (ADDERALL XR) 24 hr capsule 10 mg  10 mg Oral q1600 Mikey College T, MD       amphetamine-dextroamphetamine (ADDERALL XR) 24 hr capsule 30 mg  30 mg Oral QAC breakfast Drusilla Kanner, RPH       azithromycin [facility]) tablet 500 mg  500 mg Oral Daily Mikey College T, MD   500 mg at 11/30/22 0911   chlorpheniramine-HYDROcodone (TUSSIONEX) 10-8 MG/5ML suspension 5 mL  5 mL Oral Q12H PRN Mikey College T, MD   5 mL at 11/29/22 2026    dextromethorphan-guaiFENesin (MUCINEX DM) 30-600 MG per 12 hr tablet 1 tablet  1 tablet Oral BID Esaw Grandchild A, DO   1 tablet at 11/30/22 1047   famotidine (PEPCID) tablet 20 mg  20 mg Oral BID Mikey College T, MD   20 mg at 11/30/22 0911   feeding supplement (GLUCERNA SHAKE) (GLUCERNA SHAKE) liquid 237 mL  237 mL Oral TID BM Esaw Grandchild A, DO       hydrOXYzine (ATARAX) tablet 25 mg  25 mg Oral TID PRN Mikey College T, MD       insulin pump   Subcutaneous TID WC, HS, 0200 Mikey College T, MD   3.5 each at 11/30/22 0915   ipratropium-albuterol (DUONEB) 0.5-2.5 (3) MG/3ML nebulizer solution 3 mL  3 mL Nebulization Q6H PRN Mikey College T, MD       loratadine (CLARITIN) tablet 10 mg  10 mg Oral Daily Mikey College T, MD   10 mg at 11/30/22 0911   multivitamin with minerals tablet 1 tablet  1 tablet Oral Daily Esaw Grandchild A, DO   1 tablet at 11/30/22 1047   naproxen (NAPROSYN) tablet 500 mg  500 mg Oral BID PRN Mikey College T, MD       ondansetron [facility]) injection 4 mg  4 mg Intravenous Q6H PRN Esaw Grandchild A, DO   4 mg at 11/30/22 1236   piperacillin-tazobactam (ZOSYN) IVPB 3.375 g  3.375 g Intravenous Q8H Cheron Every E, RPH 12.5 mL/hr at 11/30/22 1238 3.375 g at 11/30/22 1238   promethazine (PHENERGAN) tablet 25 mg  25 mg Oral Q6H PRN Emeline General, MD   25 mg at 11/29/22 2021     Abtx:  Anti-infectives (From admission, onward)    Start     Dose/Rate Route Frequency Ordered Stop   11/30/22 1000  azithromycin (ZITHROMAX) tablet 500 mg        500 mg Oral Daily 11/29/22 0830     11/29/22 0845  piperacillin-tazobactam (ZOSYN) IVPB 3.375 g        3.375 g 12.5 mL/hr over 240 Minutes Intravenous Every 8 hours 11/29/22 0832     11/29/22 0500  cefTRIAXone (ROCEPHIN) 2 g in sodium chloride 0.9 % 100 mL IVPB        2 g 200 mL/hr over 30 Minutes Intravenous  Once 11/29/22 0459 11/29/22 0607   11/29/22 0500  azithromycin (ZITHROMAX) 500 mg in sodium chloride 0.9 % 250 mL IVPB        500 mg 250  mL/hr over 60 Minutes Intravenous  Once 11/29/22 0459 11/29/22 1936       REVIEW OF SYSTEMS:  Const: negative fever,  negative chills, positive weight loss Eyes: negative diplopia or visual changes, negative eye pain ENT: negative coryza, negative sore throat Resp:  cough, , dyspnea no hemoptysis Cards: negative for chest pain, palpitations, lower extremity edema GU: negative for frequency, dysuria and hematuria GI: Negative for abdominal pain, diarrhea, bleeding, constipation Skin: negative for rash and pruritus Heme: negative for easy bruising and gum/nose bleeding MS: weakness Neurolo:negative for headaches, dizziness, vertigo, memory problems  Psych: ADHD Endocrine:  diabetes Allergy/Immunology- negative for any medication or food allergies ? Objective:  VITALS:  BP 106/77 (BP Location: Left Arm)   Pulse 77   Temp 98 F (36.7 C)   Resp 18   Ht 5\' 6"  (1.676 m)   Wt 64 kg   SpO2 98%   BMI 22.77 kg/m   PHYSICAL EXAM:  General: Alert, cooperative, no distress, appears stated age.  Head: Normocephalic, without obvious abnormality, atraumatic. Eyes: Conjunctivae clear, anicteric sclerae. Pupils are equal ENT Nares normal. No drainage or sinus tenderness. Lips, mucosa, and tongue normal. No Thrush Neck: Supple, symmetrical, no adenopathy, thyroid: non tender no carotid bruit and no JVD. Back: No CVA tenderness. Lungs: decreased air entry left base- a few rhonchi Heart: Regular rate and rhythm, no murmur, rub or gallop. Abdomen: Soft, non-tender,not distended. Bowel sounds normal. No masses Extremities: atraumatic, no cyanosis. No edema. No clubbing Skin: No rashes or lesions. Or bruising Lymph: Cervical, supraclavicular normal. Neurologic: Grossly non-focal Pertinent Labs Lab Results CBC    Component Value Date/Time   WBC 2.7 (L) 11/30/2022 0350   RBC 3.67 (L) 11/30/2022 0350   HGB 9.5 (L) 11/30/2022 0350   HGB 17.9 05/06/2013 1821   HCT 29.4 (L) 11/30/2022 0350    HCT 53.6 (H) 05/06/2013 1821   PLT 120 (L) 11/30/2022 0350   PLT 253 05/06/2013 1821   MCV 80.1 11/30/2022 0350   MCV 85 05/06/2013 1821   MCH 25.9 (L) 11/30/2022 0350   MCHC 32.3 11/30/2022 0350   RDW 12.7 11/30/2022 0350   RDW 13.8 05/06/2013 1821   LYMPHSABS 1.3 11/25/2022 2205   LYMPHSABS 0.9 (L) 05/06/2013 1821   MONOABS 0.7 11/25/2022 2205   MONOABS 1.1 (H) 05/06/2013 1821   EOSABS 0.1 11/25/2022 2205   EOSABS 0.0 05/06/2013 1821   BASOSABS 0.0 11/25/2022 2205   BASOSABS 0.0 05/06/2013 1821       Latest Ref Rng & Units 11/28/2022   11:46 PM 11/25/2022   10:05 PM 10/17/2021    9:38 PM  CMP  Glucose 70 - 99 mg/dL 865  784  696   BUN 6 - 20 mg/dL 5  8  16    Creatinine 0.61 - 1.24 mg/dL 2.95  2.84  1.32   Sodium 135 - 145 mmol/L 135  135  137   Potassium 3.5 - 5.1 mmol/L 3.5  3.7  4.3   Chloride 98 - 111 mmol/L 102  106  102   CO2 22 - 32 mmol/L 24  23  27    Calcium 8.9 - 10.3 mg/dL 7.8  8.0  9.9   Total Protein 6.5 - 8.1 g/dL 7.3  7.5  8.4   Total Bilirubin 0.3 - 1.2 mg/dL 0.6  0.4  0.9   Alkaline Phos 38 - 126 U/L 53  55  70   AST 15 - 41 U/L 19  18  16    ALT 0 - 44 U/L 13  11  15        Microbiology: Recent Results (from the past 240 hour(s))  Resp panel by RT-PCR (RSV, Flu A&B, Covid) Anterior Nasal Swab     Status: None   Collection Time: 11/25/22 10:06 PM   Specimen: Anterior Nasal Swab  Result Value Ref Range Status   SARS Coronavirus 2 by RT PCR NEGATIVE NEGATIVE Final    Comment: (NOTE) SARS-CoV-2 target nucleic acids are NOT DETECTED.  The SARS-CoV-2 RNA is generally detectable in upper respiratory specimens during the acute phase of infection. The lowest concentration of SARS-CoV-2 viral copies this assay can detect is 138 copies/mL. A negative result does not preclude SARS-Cov-2 infection and should not be used as the sole basis for treatment or other patient management decisions. A negative result may occur with  improper specimen  collection/handling, submission of specimen other than nasopharyngeal swab, presence of viral mutation(s) within the areas targeted by this assay, and inadequate number of viral copies(<138 copies/mL). A negative result must be combined with clinical observations, patient history, and epidemiological information. The expected result is Negative.  Fact Sheet for Patients:  BloggerCourse.com  Fact Sheet for Healthcare Providers:  SeriousBroker.it  This test is no t yet approved or cleared by the [place] FDA and  has been authorized for detection and/or diagnosis of SARS-CoV-2 by FDA under an Emergency Use Authorization (EUA). This EUA will remain  in effect (meaning this test can be used) for the duration of the COVID-19 declaration under Section 564(b)(1) of the Act, 21 U.S.C.section 360bbb-3(b)(1), unless the authorization is terminated  or revoked sooner.       Influenza A by PCR NEGATIVE NEGATIVE Final   Influenza B by PCR NEGATIVE NEGATIVE Final    Comment: (NOTE) The Xpert Xpress SARS-CoV-2/FLU/RSV plus assay is intended as an aid in the diagnosis of influenza from Nasopharyngeal swab specimens and should not be used as a sole basis for treatment. Nasal washings and aspirates are unacceptable for Xpert Xpress SARS-CoV-2/FLU/RSV testing.  Fact Sheet for Patients: BloggerCourse.com  Fact Sheet for Healthcare Providers: SeriousBroker.it  This test is not yet approved or cleared by the [place] FDA and has been authorized for detection and/or diagnosis of SARS-CoV-2 by FDA under an Emergency Use Authorization (EUA). This EUA will remain in effect (meaning this test can be used) for the duration of the COVID-19 declaration under Section 564(b)(1) of the Act, 21 U.S.C. section 360bbb-3(b)(1), unless the authorization is terminated or revoked.     Resp Syncytial  Virus by PCR NEGATIVE NEGATIVE Final    Comment: (NOTE) Fact Sheet for Patients: BloggerCourse.com  Fact Sheet for Healthcare Providers: SeriousBroker.it  This test is not yet approved or cleared by the [place] FDA and has been authorized for detection and/or diagnosis of SARS-CoV-2 by FDA under an Emergency Use Authorization (EUA). This EUA will remain in effect (meaning this test can be used) for the duration of the COVID-19 declaration under Section 564(b)(1) of the Act, 21 U.S.C. section 360bbb-3(b)(1), unless the authorization is terminated or revoked.  Performed at [facility], [address] [place], [place] [address]   Culture, blood (routine x 2)     Status: None   Collection Time: 11/25/22 11:33 PM   Specimen: BLOOD  Result Value Ref Range Status   Specimen Description BLOOD BLOOD LEFT ARM  Final   Special Requests   Final    BOTTLES DRAWN AEROBIC AND ANAEROBIC Blood Culture adequate volume   Culture   Final    NO GROWTH 5 DAYS Performed at [facility], [address], [place], [place]  [address]    Report Status 11/30/2022 FINAL  Final  Culture, blood (routine x 2)     Status: None   Collection Time: 11/25/22 11:33 PM   Specimen: BLOOD  Result Value Ref Range Status   Specimen Description BLOOD BLOOD RIGHT ARM  Final   Special Requests   Final    BOTTLES DRAWN AEROBIC AND ANAEROBIC Blood Culture adequate volume   Culture   Final    NO GROWTH 5 DAYS Performed at [facility], [address] [place], [place] [address]    Report Status 11/30/2022 FINAL  Final  Blood Culture (routine x 2)     Status: None (Preliminary result)   Collection Time: 11/29/22  5:14 AM   Specimen: BLOOD  Result Value Ref Range Status   Specimen Description BLOOD BLOOD LEFT WRIST  Final   Special Requests   Final    BOTTLES DRAWN AEROBIC AND ANAEROBIC Blood Culture results may not be optimal due to  an excessive volume of blood received in culture bottles   Culture   Final    NO GROWTH < 24 HOURS Performed at [facility], [address] [place], [place] [address]    Report Status PENDING  Incomplete  Blood Culture (routine x 2)     Status: None (Preliminary result)   Collection Time: 11/29/22  5:14 AM   Specimen: BLOOD  Result Value Ref Range Status   Specimen Description BLOOD BLOOD RIGHT FOREARM  Final   Special Requests   Final    BOTTLES DRAWN AEROBIC AND ANAEROBIC Blood Culture results may not be optimal due to an inadequate volume of blood received in culture bottles   Culture   Final    NO GROWTH < 24 HOURS Performed at [facility], [address], [place], [place] [address]    Report Status PENDING  Incomplete    IMAGING RESULTS:  I have personally reviewed the films ?left lower lobe atelectasis Impression/Recommendation ?22 yr male presenting with Cough /sob of 2 weeks duration Preliminary HIV test is positive Awaiting confirmation Will also send HIV RNA Has risk for HIVm, MSM in a monogamous relationship but has had STD in [place] - GC/Chl.  As per his partner last HIV test was in  feb 2024 and was negative Await confirmatory test- will also send HIV RNA  Pancytopenia could be from above  Sob, left lower lobe infiltrate vs atelectasis Incentive spirometry Procal tested and < 0.10. doubt he has bacterial infection DC zosyn  Will check Ldh (n)) , beta  Dglucan  Will also check fungal antibodies  DM- type 1 on insulin  ? ? ___________________________________________________ Discussed with patient, partner and requesting provider Note:  This document was prepared using Dragon voice recognition software and may include unintentional dictation errors.

## 2022-11-30 NOTE — Inpatient Diabetes Management (Addendum)
Inpatient Diabetes Program Recommendations  AACE/ADA: New Consensus Statement on Inpatient Glycemic Control (2015)  Target Ranges:  Prepandial:   less than 140 mg/dL      Peak postprandial:   less than 180 mg/dL (1-2 hours)      Critically ill patients:  140 - 180 mg/dL    Latest Reference Range & Units 11/28/22 23:44 11/29/22 11:25 11/29/22 18:57 11/29/22 21:28 11/30/22 03:10  Glucose-Capillary 70 - 99 mg/dL 761 (H) 607 (H) 371 (H) 213 (H)  3 units Novolog  130 (H)  (H): Data is abnormally high  Latest Reference Range & Units 11/30/22 09:14  Glucose-Capillary 70 - 99 mg/dL 062 (H)  3.5 units per pump  (H): Data is abnormally high  Admit with: Sepsis due to pneumonia   History: Type 1 Diabetes  Home DM Meds: Insulin Pump  Current Orders: Insulin Pump   Should be using the Dexcom G6 CGM at home per ENDO notes    ENDO: Dr. March Rummage Last Seen 08/12/2022 Omnipod Insulin Pump Carb Ratio increased to 6.5 at that visit Omnipod 5 Basal 1.1 CR: 1:7  Sensitivity: 25 Target 150     Addendum 11:40am--Met w/ pt at bedside.  Pt A&O and able to independently manage his home insulin pump.  Omnipod Pod 5 with Humalog insulin.   Confirmed settings below with pt: Basal 1.1 CR: 1:6 Sensitivity: 25 Target 150 Pt due to change Pod tomorrow at 12pm.  Pt told me his roommate will be bringing his insulin pump supplies today.  Wearing Dexcom G6 CGM on his R upper arm.  Pt allowing RNs to check CBGs with hospital mater and pt telling RN how much insulin he is bolusing self with.     --Will follow patient during hospitalization--  Ambrose Finland RN, MSN, CDCES Diabetes Coordinator Inpatient Glycemic Control Team Team Pager: [phone] (8a-5p)     --Will follow patient during hospitalization--  Ambrose Finland RN, MSN, CDCES Diabetes Coordinator Inpatient Glycemic Control Team Team Pager: [phone] (8a-5p)

## 2022-11-30 NOTE — Assessment & Plan Note (Signed)
-  Continue home Adderall

## 2022-11-30 NOTE — Progress Notes (Signed)
Initial Nutrition Assessment  DOCUMENTATION CODES:   Not applicable  INTERVENTION:   -Glucerna Shake po TID, each supplement provides 220 kcal and 10 grams of protein  -MVI with minerals daily  NUTRITION DIAGNOSIS:   Increased nutrient needs related to acute illness as evidenced by estimated needs.  GOAL:   Patient will meet greater than or equal to 90% of their needs  MONITOR:   PO intake, Supplement acceptance  REASON FOR ASSESSMENT:   Malnutrition Screening Tool    ASSESSMENT:   Thomas Maddox with medical history significant of type 1 diabetes, diabetic gastroparesis, ADHD, presented with worsening of cough shortness of breath and fever.  Thomas Maddox admitted with sepsis secondary to LLL bacterial pneumonia vs aspiration pneumonia.   Reviewed I/O's: +1.8 L x 24 hours   Thomas Maddox unavailable at time of visit. Attempted to speak with Thomas Maddox via call to hospital room phone, however, unable to reach. RD unable to obtain further nutrition-related history or complete nutrition-focused physical exam at this time.    Thomas Maddox currently on a carb modified diet. Noted meal completions 75%.  Reviewed wt hx; noted distant history of weight loss over the past 15 months.   Given Thomas Maddox's history of gastroparesis, would benefit from addition of oral nutrition supplements.  Medications reviewed.  Lab Results  Component Value Date   HGBA1C 11.8 (H) 07/04/2019   PTA DM medications are Thomas Maddox managed by insulin pump (up to 100 units humalog daily).   Labs reviewed: CBGS: 134 (inpatient orders for glycemic control are insulin pump).    Diet Order:   Diet Order             Diet Carb Modified Fluid consistency: Thin; Room service appropriate? Yes  Diet effective now                   EDUCATION NEEDS:   No education needs have been identified at this time  Skin:  Skin Assessment: Reviewed RN Assessment  Last BM:  11/27/22  Height:   Ht Readings from Last 1 Encounters:  11/28/22 5\' 6"  (1.676 m)     Weight:   Wt Readings from Last 1 Encounters:  11/28/22 64 kg    Ideal Body Weight:  64.5 kg  BMI:  Body mass index is 22.77 kg/m.  Estimated Nutritional Needs:   Kcal:  1900-2100  Protein:  100-115 grams  Fluid:  > 1.9 L    Levada Schilling, RD, LDN, CDCES Registered Dietitian II Certified Diabetes Care and Education Specialist Please refer to [facility] for RD and/or RD on-call/weekend/after hours pager

## 2022-11-30 NOTE — Assessment & Plan Note (Signed)
Continue home insulin pump ?

## 2022-11-30 NOTE — Hospital Course (Signed)
HPI on admission 11/29/22 by Dr. Chipper Herb: "Thomas Maddox. is a 22 y.o. male with medical history significant of type 1 diabetes, diabetic gastroparesis, ADHD, presented with worsening of cough shortness of breath and fever.   Patient went down a beach trip last weekend and then came back Tuesday and started to have dry cough associated with shortness of breath, went to see urgent care was diagnosed with bronchitis and sent home with p.o. antibiotics and steroid.  Thursday, patient came back to ED for worsening of dry cough and shortness of breath x-ray found LLL infiltrate suspicious for pneumonia and patient was discharged on Augmentin and azithromycin.  Over the last 3 days however patient symptoms getting worse and he also developed frequent nauseous vomiting and his suspect at least some of antibiotics have been vomited off his stomach, denies any abdominal pain, no diarrhea.  Yesterday he started to develop fever Tmax 101.0   ED Course: Temperature 100.0, borderline tachycardia pulm and tachypneic O2 saturation 98% on room air.  Chest x-ray showed worsening of LLL infiltrates suspicious for worsening of pneumonia.  WBC 2.7, creatinine 0.9, K3.5, glucose 162.  Lactic acid 0.9   Code sepsis was called, patient was given vancomycin and cefepime in the ED."  Patient was admitted to the hospital and continued on IV antibiotics with Zosyn and Zithromax.  Further hospital course and management as outlined below.

## 2022-11-30 NOTE — Assessment & Plan Note (Signed)
Zofran PRN for N/V

## 2022-11-30 NOTE — Plan of Care (Signed)
  Problem: Clinical Measurements: Goal: Ability to maintain clinical measurements within normal limits will improve Outcome: Progressing   Problem: Clinical Measurements: Goal: Respiratory complications will improve Outcome: Progressing   Problem: Clinical Measurements: Goal: Cardiovascular complication will be avoided Outcome: Progressing   Problem: Activity: Goal: Risk for activity intolerance will decrease Outcome: Progressing   

## 2022-11-30 NOTE — Assessment & Plan Note (Addendum)
Pt's admission HIV screen is reactive. Pt and his male partner have been informed.  CD-4 count very low at 22. --Infectious disease consulted, appreciate input --This raises possibility for opportunistic infection as cause of pneumonia.  Further evaluation as per ID - see recommendations --Follow pending HIV 1/2 Ab differentiation / confirmation --Outpatient ID follow up

## 2022-12-01 ENCOUNTER — Other Ambulatory Visit (HOSPITAL_COMMUNITY): Payer: Self-pay

## 2022-12-01 ENCOUNTER — Inpatient Hospital Stay: Payer: Medicaid Other

## 2022-12-01 DIAGNOSIS — B20 Human immunodeficiency virus [HIV] disease: Secondary | ICD-10-CM | POA: Diagnosis not present

## 2022-12-01 DIAGNOSIS — A419 Sepsis, unspecified organism: Secondary | ICD-10-CM | POA: Diagnosis not present

## 2022-12-01 DIAGNOSIS — J189 Pneumonia, unspecified organism: Secondary | ICD-10-CM | POA: Diagnosis not present

## 2022-12-01 LAB — GLUCOSE, CAPILLARY
Glucose-Capillary: 148 mg/dL — ABNORMAL HIGH (ref 70–99)
Glucose-Capillary: 164 mg/dL — ABNORMAL HIGH (ref 70–99)
Glucose-Capillary: 171 mg/dL — ABNORMAL HIGH (ref 70–99)
Glucose-Capillary: 307 mg/dL — ABNORMAL HIGH (ref 70–99)
Glucose-Capillary: 324 mg/dL — ABNORMAL HIGH (ref 70–99)

## 2022-12-01 LAB — FOLATE: Folate: 7.5 ng/mL (ref >5.9)

## 2022-12-01 LAB — VITAMIN B12: Vitamin B-12: 179 pg/mL — ABNORMAL LOW (ref 180–914)

## 2022-12-01 NOTE — Progress Notes (Signed)
SATURATION QUALIFICATIONS: (This note is used to comply with regulatory documentation for home oxygen)  Patient Saturations on Room Air at Rest = 97%  Patient Saturations on Room Air while Ambulating = 98%  Patient Saturations on 0 Liters of oxygen while Ambulating = 97%  Please briefly explain why patient needs home oxygen:

## 2022-12-01 NOTE — Plan of Care (Signed)

## 2022-12-01 NOTE — TOC Benefit Eligibility Note (Signed)
Patient Product/process development scientist completed.    The patient is insured through [facility] MEDICAID.     Ran test claim for Biktarvy and the current 30 day co-pay is $0.00.   This test claim was processed through [facility]- copay amounts may vary at other pharmacies due to pharmacy/plan contracts, or as the patient moves through the different stages of their insurance plan.     Roland Earl, CPHT Pharmacy Patient Advocate Specialist [facility] Patient Advocate Team Direct Number: [phone]  Fax: [phone]

## 2022-12-01 NOTE — Progress Notes (Signed)
Progress Note   Patient: Thomas Maddox. IOE:[id number] DOB: 05/15/2000 DOA: 11/29/2022     2 DOS: the patient was seen and examined on 12/01/2022   Brief hospital course: HPI on admission 11/29/22 by Dr. Chipper Herb: "Thomas Maddox. is a 22 y.o. male with medical history significant of type 1 diabetes, diabetic gastroparesis, ADHD, presented with worsening of cough shortness of breath and fever.   Patient went down a beach trip last weekend and then came back Tuesday and started to have dry cough associated with shortness of breath, went to see urgent care was diagnosed with bronchitis and sent home with p.o. antibiotics and steroid.  Thursday, patient came back to ED for worsening of dry cough and shortness of breath x-ray found LLL infiltrate suspicious for pneumonia and patient was discharged on Augmentin and azithromycin.  Over the last 3 days however patient symptoms getting worse and he also developed frequent nauseous vomiting and his suspect at least some of antibiotics have been vomited off his stomach, denies any abdominal pain, no diarrhea.  Yesterday he started to develop fever Tmax 101.0   ED Course: Temperature 100.0, borderline tachycardia pulm and tachypneic O2 saturation 98% on room air.  Chest x-ray showed worsening of LLL infiltrates suspicious for worsening of pneumonia.  WBC 2.7, creatinine 0.9, K3.5, glucose 162.  Lactic acid 0.9   Code sepsis was called, patient was given vancomycin and cefepime in the ED."  Patient was admitted to the hospital and continued on IV antibiotics with Zosyn and Zithromax.  Further hospital course and management as outlined below.  Assessment and Plan: * Sepsis due to pneumonia (HCC) Sepsis POA as evidenced by fever, leukopenia, source of infection is worsening of LLL pneumonia failed outpatient management with PO abx. 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 Pt and his male partner have been informed.  CD-4 count very low at 22. --Infectious disease consulted, appreciate input --This raises possibility for opportunistic infection as cause of pneumonia.  Further evaluation as per ID - see recommendations --Follow pending HIV 1/2 Ab differentiation / confirmation --Outpatient ID follow up  Type 1 diabetes mellitus without complication (HCC) Continue home insulin pump  ADHD Continue home Adderall        Subjective: Pt awake sitting up in bed this AM.  Asks for breakfast to be heated up, unable to eat cold food.  He reports feeling somewhat better, but has not been up walking around much, no sure if short of breath on exertion.  No fever/chills.     Physical Exam: Vitals:   12/01/22 0009 12/01/22 0448 12/01/22 0739 12/01/22 1535  BP: 112/77  118/79 116/80  Pulse: 93  77 90  Resp: 18  17 20   Temp: 97.7 F (36.5 C)  (!) 97.5 F (36.4 C) 98.4 F (36.9 C)  TempSrc:    Oral  SpO2: 100%  100% 100%  Weight:  66.5 kg    Height:       General exam: awake, alert, no acute distress HEENT: moist mucus membranes, hearing grossly normal  Respiratory system: improved  aeration today, clear b/l, no wheezes or rhonchi, normal respiratory effort at rest, on room air, deep inspirations trigger coughing. Cardiovascular system: normal S1/S2, RRR, no edema Central nervous system: A&O x 4. no gross focal neurologic deficits, normal speech Extremities: moves all, no edema, normal tone Skin: dry, intact, normal temperature, normal color, No rashes, lesions or ulcers Psychiatry: normal mood,  congruent affect, judgement and insight appear normal  Data Reviewed:  Notable labs --- WBC 2.3, Hbg 10.4, platelets 143k Glucose 131, Ca 8.4 CBG's at goal Vitamin B12 179 (ref 180-914) Negative respiratory viral panel  HIV screen - reactive CD 4 count low 22 LDH normal RPR non-reactive  Pending -- CT chest wo contrast --HIV 1/2 differentiation --HIV RNA / viral load --Send outs per ID - fungal antibodies  Family Communication: None present on rounds. Pt is able to update.  Disposition: Status is: Inpatient Remains inpatient appropriate because: Severity of illness, ongoing evaluation   Planned Discharge Destination: Home    Time spent: 38 minutes  Author: Pennie Banter, DO 12/01/2022 3:36 PM  For on call review [email or web address].

## 2022-12-01 NOTE — Progress Notes (Signed)
Date of Admission:  11/29/2022      ID: Thomas Maddox. is a 22 y.o. 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)   Diabetic gastroparesis (HCC)    Subjective: Doing okay No SOB  Medications:   amphetamine-dextroamphetamine  10 mg Oral q1600   amphetamine-dextroamphetamine  30 mg Oral QAC breakfast   azithromycin  500 mg Oral Daily   dextromethorphan-guaiFENesin  1 tablet Oral BID   famotidine  20 mg Oral BID   feeding supplement (GLUCERNA SHAKE)  237 mL Oral TID BM   insulin pump   Subcutaneous TID WC, HS, 0200   loratadine  10 mg Oral Daily   multivitamin with minerals  1 tablet Oral Daily    Objective: Vital signs in last 24 hours: Patient Vitals for the past 24 hrs:  BP Temp Pulse Resp SpO2 Weight  12/01/22 0739 118/79 (!) 97.5 F (36.4 C) 77 17 100 % --  12/01/22 0448 -- -- -- -- -- 66.5 kg  12/01/22 0009 112/77 97.7 F (36.5 C) 93 18 100 % --     PHYSICAL EXAM:  General: Alert, cooperative, no distress, appears stated age.  Head: Normocephalic, without obvious abnormality, atraumatic. Eyes: Conjunctivae clear, anicteric sclerae. Pupils are equal ENT Nares normal. No drainage or sinus tenderness. Lips, mucosa, and tongue normal. No Thrush Neck: Supple, symmetrical, no adenopathy, thyroid: non tender no carotid bruit and no JVD. Back: No CVA tenderness. Lungs: Clear to auscultation bilaterally. No Wheezing or Rhonchi. No rales. Heart: Regular rate and rhythm, no murmur, rub or gallop. Abdomen: Soft, non-tender,not distended. Bowel sounds normal. No masses Extremities: atraumatic, no cyanosis. No edema. No clubbing Skin: No rashes or lesions. Or bruising Lymph: Cervical, supraclavicular normal. Neurologic: Grossly non-focal  Lab Results    Latest Ref Rng & Units 12/01/2022    4:30 AM 11/30/2022    3:31 PM  11/30/2022    3:50 AM  CBC  WBC 4.0 - 10.5 K/uL 2.3  3.1  2.7   Hemoglobin 13.0 - 17.0 g/dL 16.1  09.6  9.5   Hematocrit 39.0 - 52.0 % 30.9  36.7  29.4   Platelets 150 - 400 K/uL 143  148  120        Latest Ref Rng & Units 12/01/2022    4:30 AM 11/28/2022   11:46 PM 11/25/2022   10:05 PM  CMP  Glucose 70 - 99 mg/dL 045  409  811   BUN 6 - 20 mg/dL 8  <5  8   Creatinine 9.14 - 1.24 mg/dL 7.82  9.56  2.13   Sodium 135 - 145 mmol/L 140  135  135   Potassium 3.5 - 5.1 mmol/L 3.6  3.5  3.7   Chloride 98 - 111 mmol/L 105  102  106   CO2 22 - 32 mmol/L 28  24  23    Calcium 8.9 - 10.3 mg/dL 8.4  7.8  8.0   Total Protein 6.5 - 8.1 g/dL  7.3  7.5   Total Bilirubin 0.3 - 1.2 mg/dL  0.6  0.4   Alkaline Phos 38 - 126 U/L  53  55   AST 15 - 41 U/L  19  18   ALT 0 - 44 U/L  13  11       Microbiology: Bc ng Hiv  reactive Studies/Results: No results found.   Assessment/Plan: AIDS- newly diagnosed-  Cd4 22  VL 316K Will start Biktarvy Explained to patient about the positive confirmatory result and low cd4  Sob and infiltrate lung Would need bronch as Ct chest shows GGO To r/o PCP Ldh N  Pancytopenia due to above- also checking histoplasma, CMV, MAI  Dsicussed the management with the patient He will discuss with his partner

## 2022-12-02 ENCOUNTER — Telehealth: Payer: Self-pay

## 2022-12-02 DIAGNOSIS — J189 Pneumonia, unspecified organism: Secondary | ICD-10-CM | POA: Diagnosis not present

## 2022-12-02 DIAGNOSIS — E109 Type 1 diabetes mellitus without complications: Secondary | ICD-10-CM | POA: Diagnosis not present

## 2022-12-02 DIAGNOSIS — A419 Sepsis, unspecified organism: Secondary | ICD-10-CM | POA: Diagnosis not present

## 2022-12-02 DIAGNOSIS — B20 Human immunodeficiency virus [HIV] disease: Secondary | ICD-10-CM | POA: Diagnosis not present

## 2022-12-02 DIAGNOSIS — J181 Lobar pneumonia, unspecified organism: Secondary | ICD-10-CM | POA: Diagnosis not present

## 2022-12-02 LAB — GLUCOSE, CAPILLARY
Glucose-Capillary: 190 mg/dL — ABNORMAL HIGH (ref 70–99)
Glucose-Capillary: 175 mg/dL — ABNORMAL HIGH (ref 70–99)
Glucose-Capillary: 131 mg/dL — ABNORMAL HIGH (ref 70–99)
Glucose-Capillary: 286 mg/dL — ABNORMAL HIGH (ref 70–99)

## 2022-12-02 LAB — HEPATITIS PANEL, ACUTE
HCV Ab: NONREACTIVE
Hep A IgM: NONREACTIVE
Hep B C IgM: NONREACTIVE
Hepatitis B Surface Ag: NONREACTIVE

## 2022-12-02 MED ORDER — ENSURE ENLIVE PO LIQD: 237.0000 mL | Freq: Two times a day (BID) | ORAL | Status: DC

## 2022-12-02 NOTE — Inpatient Diabetes Management (Signed)
Inpatient Diabetes Program Recommendations  AACE/ADA: New Consensus Statement on Inpatient Glycemic Control (2015)  Target Ranges:  Prepandial:   less than 140 mg/dL      Peak postprandial:   less than 180 mg/dL (1-2 hours)      Critically ill patients:  140 - 180 mg/dL   Lab Results  Component Value Date   GLUCAP 131 (H) 12/02/2022   HGBA1C 11.8 (H) 07/04/2019    Review of Glycemic Control  Latest Reference Range & Units 12/01/22 16:45 12/01/22 21:02 12/02/22 01:49 12/02/22 07:40 12/02/22 11:19  Glucose-Capillary 70 - 99 mg/dL 010 (H) 932 (H) 355 (H) 175 (H) 131 (H)   Diabetes history: Type 1 DM Outpatient Diabetes medications:  Insulin pump- Current orders for Inpatient glycemic control: Insulin pump  Inpatient Diabetes Program Recommendations:    Briefly spoke to patient regarding insulin pump.  He recently changed site.  He is fairly new to Omnipod insulin pump and states his control is much better.  Reminded him to bolus for CHO and use tickets on trays to count.  He states that he is covering CHO with meals instead of before.  May need further adjustment to insulin pump- Encouraged close F/U with endocrinology.    Thanks,  Beryl Meager, RN, BC-ADM Inpatient Diabetes Coordinator Pager [phone]  (8a-5p)

## 2022-12-02 NOTE — Telephone Encounter (Signed)
Received call from Dr. Diana Eves to schedule inpatient bronchoscopy. Flexible bronchoscopy schedule 12/03/2022 at 12:00. [id number]

## 2022-12-02 NOTE — H&P (View-Only) (Signed)
NAME:  Thomas Maddox., MRN:  [id number], DOB:  04/26/2000, LOS: 3 ADMISSION DATE:  11/29/2022, CONSULTATION DATE:  12/02/2022 REFERRING MD:  Rosezetta Schlatter, MD, CHIEF COMPLAINT:  HIV/AIDS   History of Present Illness:   The patient is a pleasant 22 year old male presenting to the hospital with increased shortness of breath and is newly diagnosed with AIDS.  Patient reports symptoms of exertional dyspnea as well as a cough that started around a week prior to presentation.  He was seen in urgent care where he was diagnosed with bronchitis/pneumonia and given antibiotics and steroids.  He was then seen in the ER and again given antibiotics.  He was discharged and continued to have worsening symptoms of cough, shortness of breath, nausea, and vomiting prompting presentation again to the ED.  Patient was admitted to the hospital on 11/29/2022 initially for management of sepsis secondary to left lower lobe pneumonia.  Workup included an HIV test that turned out positive with a CD4 count of 22 for which infectious diseases were consulted.  Patient also underwent a CT scan of the chest showing infiltrates in the left lower lobe.  Patient was seen by infectious diseases with plan to start Biktarvy and pulmonary is consulted for evaluation of his left lower lobe opacities.  Today, patient reports feeling better compared to when he was admitted.  His shortness of breath is improved and he does not have any cough.  He denies any chest pain, chest tightness, hemoptysis, sputum production, fevers, or chills.  He did have a fever prior to this admission.  Patient reports a history of vaping nicotine products but denies any cigarette smoking or illicit drug use.  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  He does not endorse any occupational exposures.  He does have a cat.  Pertinent  Medical History   -Type 1 diabetes -Diabetic gastroparesis -ADHD -HIV/AIDS  Objective   Blood pressure  118/73, pulse (!) 110, temperature 99.5 F (37.5 C), temperature source Oral, resp. rate 17, height 5\' 6"  (1.676 m), weight 66.5 kg, SpO2 100%.        Intake/Output Summary (Last 24 hours) at 12/02/2022 1702 Last data filed at 12/02/2022 1536 Gross per 24 hour  Intake 1020 ml  Output --  Net 1020 ml   Filed Weights   11/28/22 2344 12/01/22 0448  Weight: 64 kg 66.5 kg    Examination: Physical Exam Vitals reviewed.  Constitutional:      General: He is not in acute distress.    Appearance: He is not ill-appearing.  HENT:     Head: Normocephalic.     Mouth/Throat:     Mouth: Mucous membranes are moist.  Cardiovascular:     Rate and Rhythm: Normal rate and regular rhythm.     Pulses: Normal pulses.     Heart sounds: Normal heart sounds.  Pulmonary:     Effort: Pulmonary effort is normal.     Breath sounds: Normal breath sounds. No wheezing or rales.  Abdominal:     General: There is no distension.     Palpations: Abdomen is soft.  Musculoskeletal:        General: Normal range of motion.     Right lower leg: No edema.     Left lower leg: No edema.  Neurological:     General: No focal deficit present.     Mental Status: He is alert and oriented to person, place, and time. Mental status is at  baseline.      Assessment & Plan:   #[facility] Pulmonary Infiltrate #HIV/AIDS #Concern Opportunistic Infection  Patient is a 22 year old male with a past medical history of type 1 diabetes and newly diagnosed HIV/AIDS who presents to the hospital with increased shortness of breath, cough, and findings concerning for left lower lobe pneumonia.  With this new diagnosis of AIDS with a CD4 of 22, there is high suspicion for an opportunistic infection, especially PJP or toxoplasma.  Review of his chest CT from 12/01/2022 was notable for patchy consolidative opacities, more so in the left lower lobe with minimal involvement in the right lower lobe and lingula.  There is also mosaic attenuation  again most notable in the bilateral lower lobes with some involvement in the lingula.  Given patient's severe immunosuppression and AIDS, it is reasonable to proceed with fiberoptic bronchoscopy with bronchoalveolar lavage to assess and rule out opportunistic infections such as PJP, CMV, toxoplasma, TB, NTM, nocardia, actinomyces, and fungi (including Aspergillus and endemic fungi).  Other etiologies lower on the differential include lymphoma, Kaposi's sarcoma, and idiopathic interstitial pneumonias.  Patient is currently appropriate for flexible bronchoscopy with bronchoalveolar lavage.  I met with him and his partner at the bedside and all the questions were answered.  Finally, I would recommend an echocardiogram to rule out any RV dysfunction or pulmonary hypertension given newly diagnosed HIV.  -flexible bronchoscopy tomorrow -NPO after midnight -hold anti-coagulation -TTE  Raechel Chute, MD [facility] 12/02/2022 5:17 PM    Labs   CBC: Recent Labs  Lab 11/25/22 2205 11/28/22 2346 11/30/22 0350 11/30/22 1531 12/01/22 0430 12/02/22 0504  WBC 5.7 2.5* 2.7* 3.1* 2.3* 3.1*  NEUTROABS 3.6  --   --  1.5  --   --   HGB 12.2* 11.1* 9.5* 11.9* 10.4* 10.3*  HCT 37.0* 34.0* 29.4* 36.7* 30.9* 30.4*  MCV 81.9 81.0 80.1 82 79.2* 79.6*  PLT 128* 166 120* 148* 143* 129*    Basic Metabolic Panel: Recent Labs  Lab 11/25/22 2205 11/28/22 2346 12/01/22 0430  NA 135 135 140  K 3.7 3.5 3.6  CL 106 102 105  CO2 23 24 28   GLUCOSE 160* 162* 131*  BUN 8 <5* 8  CREATININE 0.74 0.79 0.65  CALCIUM 8.0* 7.8* 8.4*   GFR: Estimated Creatinine Clearance: 128.5 mL/min (by C-G formula based on SCr of 0.65 mg/dL). Recent Labs  Lab 11/25/22 2333 11/28/22 2346 11/29/22 0515 11/29/22 0807 11/30/22 0350 11/30/22 1531 12/01/22 0430 12/02/22 0504  PROCALCITON  --   --   --   --   --  <0.10  --   --   WBC  --    < >  --   --  2.7* 3.1* 2.3* 3.1*  LATICACIDVEN 0.8  --  0.7  0.7  --   --   --   --    < > = values in this interval not displayed.    Liver Function Tests: Recent Labs  Lab 11/25/22 2205 11/28/22 2346  AST 18 19  ALT 11 13  ALKPHOS 55 53  BILITOT 0.4 0.6  PROT 7.5 7.3  ALBUMIN 2.8* 2.6*   Recent Labs  Lab 11/28/22 2346  LIPASE 26   No results for input(s): "AMMONIA" in the last 168 hours.  ABG    Component Value Date/Time   PHART 7.27 (L) 05/28/2015 0221   PCO2ART 26 (L) 05/28/2015 0221   PO2ART 118 (H) 05/28/2015 0221   HCO3 28.5 (H) 10/07/2020  0040   ACIDBASEDEF 1.1 07/03/2019 1409   O2SAT 95.7 10/07/2020 0040     Coagulation Profile: No results for input(s): "INR", "PROTIME" in the last 168 hours.  Cardiac Enzymes: No results for input(s): "CKTOTAL", "CKMB", "CKMBINDEX", "TROPONINI" in the last 168 hours.  HbA1C: Hgb A1c MFr Bld  Date/Time Value Ref Range Status  07/04/2019 06:45 AM 11.8 (H) 4.8 - 5.6 % Final    Comment:    (NOTE) Pre diabetes:          5.7%-6.4% Diabetes:              >6.4% Glycemic control for   <7.0% adults with diabetes   02/10/2016 03:03 AM 11.8 (H) 4.8 - 5.6 % Final    Comment:    (NOTE)         Pre-diabetes: 5.7 - 6.4         Diabetes: >6.4         Glycemic control for adults with diabetes: <7.0     CBG: Recent Labs  Lab 12/01/22 1645 12/01/22 2102 12/02/22 0149 12/02/22 0740 12/02/22 1119  GLUCAP 307* 324* 190* 175* 131*    Review of Systems:   Review of Systems  Constitutional:  Negative for chills, fever and weight loss.  Respiratory:  Positive for cough and shortness of breath. Negative for sputum production and wheezing.   Cardiovascular:  Negative for chest pain.  Skin:  Negative for rash.     Past Medical History:  He,  has a past medical history of ADHD (attention deficit hyperactivity disorder), AKI (acute kidney injury) (HCC) (02/09/2016), Diabetes mellitus without complication (HCC), DKA (diabetic ketoacidoses) (03/17/2015), and Tachycardia (07/04/2019).    Surgical History:   Past Surgical History:  Procedure Laterality Date   ESOPHAGOGASTRODUODENOSCOPY (EGD) WITH PROPOFOL N/A 02/25/2021   Procedure: ESOPHAGOGASTRODUODENOSCOPY (EGD) WITH PROPOFOL;  Surgeon: Toney Reil, MD;  Location: [facility];  Service: Gastroenterology;  Laterality: N/A;   NO PAST SURGERIES       Social History:   reports that he has never smoked. He has never used smokeless tobacco. He reports that he does not drink alcohol and does not use drugs.   Family History:  His family history includes Hypertension in an other family member.   Allergies No Known Allergies   Home Medications  Prior to Admission medications   Medication Sig Start Date End Date Taking? Authorizing Provider  albuterol (VENTOLIN HFA) 108 (90 Base) MCG/ACT inhaler Inhale 2 puffs into the lungs every 4 (four) hours as needed for wheezing or shortness of breath. 11/26/22  Yes Irean Hong, MD  amoxicillin-clavulanate (AUGMENTIN) 875-125 MG tablet Take 1 tablet by mouth 2 (two) times daily. 11/26/22  Yes Irean Hong, MD  amphetamine-dextroamphetamine (ADDERALL XR) 10 MG 24 hr capsule Take 10 mg by mouth every morning.    Yes [provider]  amphetamine-dextroamphetamine (ADDERALL XR) 30 MG 24 hr capsule Take 30 mg by mouth daily before breakfast.   Yes [provider]  cetirizine (ZYRTEC) 10 MG tablet Take 10 mg by mouth daily as needed.   Yes [provider]  chlorpheniramine-HYDROcodone (TUSSIONEX) 10-8 MG/5ML Take 5 mLs by mouth every 12 (twelve) hours as needed for cough. 11/26/22  Yes Irean Hong, MD  famotidine (PEPCID) 20 MG tablet Take 1 tablet (20 mg total) by mouth 2 (two) times daily. 09/17/21  Yes Sharman Cheek, MD  HUMALOG 100 UNIT/ML injection 100 units daily to be used in in the patients insulin pump  07/03/22  Yes [provider]  hydrOXYzine (ATARAX) 25 MG tablet Take 1 tablet (25 mg total) by mouth 3 (three) times daily as needed for  anxiety. 09/14/21  Yes Minna Antis, MD  metoCLOPramide (REGLAN) 10 MG tablet Take 1 tablet (10 mg total) by mouth every 6 (six) hours as needed. 09/17/21  Yes Sharman Cheek, MD  naproxen (NAPROSYN) 500 MG tablet Take 1 tablet (500 mg total) by mouth 2 (two) times daily with a meal. Patient taking differently: Take 500 mg by mouth 2 (two) times daily as needed. 09/17/21  Yes Sharman Cheek, MD  promethazine (PHENERGAN) 25 MG tablet Take 1 tablet (25 mg total) by mouth every 6 (six) hours as needed for nausea or vomiting. 07/30/21  Yes Willy Eddy, MD  azithromycin (ZITHROMAX) 250 MG tablet Take by mouth. Patient not taking: Reported on 11/29/2022 11/25/22   [provider]  blood glucose meter kit and supplies KIT Dispense based on patient and insurance preference. Use up to four times daily as directed. (FOR ICD-9 250.00, 250.01). 07/04/19   Zigmund Daniel., MD  insulin aspart (NOVOLOG) 100 UNIT/ML FlexPen Inject 8 Units into the skin 3 (three) times daily with meals. Patient not taking: Reported on 11/29/2022 07/04/19   Zigmund Daniel., MD  insulin degludec (TRESIBA) 100 UNIT/ML FlexTouch Pen Inject into the skin. Patient not taking: Reported on 02/25/2021 02/03/16   [provider]  Insulin Pen Needle 32G X 4 MM MISC Use with insulin 07/04/19   Zigmund Daniel., MD  NOVOLOG MIX 70/30 FLEXPEN (70-30) 100 UNIT/ML FlexPen Inject into the skin. Patient not taking: Reported on 11/29/2022 01/24/21   [provider]      I spent 80 minutes caring for this patient today, including preparing to see the patient, obtaining a medical history , reviewing a separately obtained history, performing a medically appropriate examination and/or evaluation, counseling and educating the patient/family/caregiver, ordering medications, tests, or procedures, referring and communicating with other health care professionals (not separately reported), documenting clinical  information in the electronic health record, and independently interpreting results (not separately reported/billed) and communicating results to the patient/family/caregiver

## 2022-12-02 NOTE — Progress Notes (Addendum)
1423 Pt HR 100-117 denies SOB or chest pain. Dr Meriam Sprague made aware. No new orders at this time. Will continue to monitor.   0981 Per Dr Meriam Sprague pt ok to shower.

## 2022-12-02 NOTE — Progress Notes (Signed)
Date of Admission:  11/29/2022      ID: Thomas Maddox. is a 22 y.o. male  Principal Problem:   Sepsis due to pneumonia [facility]) Active Problems:   ADHD   Type 1 diabetes mellitus without complication (HCC)   Atypical pneumonia   HIV test positive (HCC)   Diabetic gastroparesis (HCC)   AIDS (acquired immune deficiency syndrome) (HCC)   Spoke to his partner Thomas Hua on the phone Subjective: Doing okay No SOB  Medications:   amphetamine-dextroamphetamine  10 mg Oral q1600   amphetamine-dextroamphetamine  30 mg Oral QAC breakfast   dextromethorphan-guaiFENesin  1 tablet Oral BID   famotidine  20 mg Oral BID   feeding supplement (GLUCERNA SHAKE)  237 mL Oral TID BM   insulin pump   Subcutaneous TID WC, HS, 0200   loratadine  10 mg Oral Daily   multivitamin with minerals  1 tablet Oral Daily    Objective: Vital signs in last 24 hours: Patient Vitals for the past 24 hrs:  BP Temp Temp src Pulse Resp SpO2  12/02/22 0740 109/69 98.6 F (37 C) -- (!) 103 15 98 %  12/02/22 0015 98/61 -- -- 94 17 98 %  12/01/22 1535 116/80 98.4 F (36.9 C) Oral 90 20 100 %     PHYSICAL EXAM:  General: Alert, cooperative, no distress, appears stated age.  Lungs: b/l air entry Basal crepts Heart: Regular rate and rhythm, no murmur, rub or gallop. Abdomen: Soft, non-tender,not distended. Bowel sounds normal. No masses Extremities: atraumatic, no cyanosis. No edema. No clubbing Skin: No rashes or lesions. Or bruising Lymph: Cervical, supraclavicular normal. Neurologic: Grossly non-focal  Lab Results    Latest Ref Rng & Units 12/02/2022    5:04 AM 12/01/2022    4:30 AM 11/30/2022    3:31 PM  CBC  WBC 4.0 - 10.5 K/uL 3.1  2.3  3.1   Hemoglobin 13.0 - 17.0 g/dL 82.9  56.2  13.0   Hematocrit 39.0 - 52.0 % 30.4  30.9  36.7   Platelets 150 - 400 K/uL 129  143  148        Latest Ref Rng & Units 12/01/2022    4:30 AM 11/28/2022   11:46 PM 11/25/2022   10:05 PM  CMP  Glucose 70 - 99 mg/dL  865  784  696   BUN 6 - 20 mg/dL 8  <5  8   Creatinine 2.95 - 1.24 mg/dL 2.84  1.32  4.40   Sodium 135 - 145 mmol/L 140  135  135   Potassium 3.5 - 5.1 mmol/L 3.6  3.5  3.7   Chloride 98 - 111 mmol/L 105  102  106   CO2 22 - 32 mmol/L 28  24  23    Calcium 8.9 - 10.3 mg/dL 8.4  7.8  8.0   Total Protein 6.5 - 8.1 g/dL  7.3  7.5   Total Bilirubin 0.3 - 1.2 mg/dL  0.6  0.4   Alkaline Phos 38 - 126 U/L  53  55   AST 15 - 41 U/L  19  18   ALT 0 - 44 U/L  13  11       Microbiology: Bc ng Hiv reactive Vl 314K Cd4 is 22 Toxo igG 64  Studies/Results: CT CHEST WO CONTRAST  Result Date: 12/01/2022 CLINICAL DATA:  Pneumonia EXAM: CT CHEST WITHOUT CONTRAST TECHNIQUE: Multidetector CT imaging of the chest was performed following the standard protocol without IV contrast.  RADIATION DOSE REDUCTION: This exam was performed according to the departmental dose-optimization program which includes automated exposure control, adjustment of the mA and/or kV according to patient size and/or use of iterative reconstruction technique. COMPARISON:  Chest radiograph dated 11/29/2022. CTA chest dated 10/31/2020. FINDINGS: Cardiovascular: The heart is normal in size. No pericardial effusion. No evidence of thoracic aortic aneurysm. Mediastinum/Nodes: No suspicious mediastinal lymphadenopathy. Visualized thyroid is unremarkable. Lungs/Pleura: Faint ground-glass/patchy opacities in the lingula and bilateral lower lobes, left lower lobe predominant, suggesting mild multifocal pneumonia. No suspicious pulmonary nodules. Trace bilateral pleural fluid.  No pneumothorax. Upper Abdomen: Visualized upper abdomen is grossly unremarkable. Musculoskeletal: Visualized osseous structures are within normal limits. IMPRESSION: Mild multifocal pneumonia, left lower lobe predominant. Trace bilateral pleural effusions. Electronically Signed   By: Charline Bills M.D.   On: 12/01/2022 18:48     Assessment/Plan: AIDS- newly diagnosed-   Cd4 22  VL 316K Will start Biktarvy once we know what his lung pathology is in order to not increase the risk for IRIS   Sob and infiltrate lung need bronch as Ct chest shows GGO To r/o PJP /Toxo/fungal Ldh N  Toxo IgG is high at 64, IgM neg  Pancytopenia due to above- also checked histoplasma, CMV, MAI  Discussed the management with the patient and his partner Discussed with pulmonologist Dr.Dgayli

## 2022-12-02 NOTE — Progress Notes (Signed)
Progress Note   Patient: Thomas Maddox. VWU:[id number] DOB: 2000/11/10 DOA: 11/29/2022     3 DOS: the patient was seen and examined on 12/02/2022   Subjective: Patient was on oxygen which has been weaned off and currently maintaining saturation on room air as well as on ambulation Admits to improvement in respiratory function Infectious disease planning on bronchoscopy to rule out PCP  Brief hospital course: HPI on admission 11/29/22 by Dr. Chipper Herb: "Thomas Maddox. is a 22 y.o. male with medical history significant of type 1 diabetes, diabetic gastroparesis, ADHD, presented with worsening of cough shortness of breath and fever.   Patient went down a beach trip last weekend and then came back Tuesday and started to have dry cough associated with shortness of breath, went to see urgent care was diagnosed with bronchitis and sent home with p.o. antibiotics and steroid.  Thursday, patient came back to ED for worsening of dry cough and shortness of breath x-ray found LLL infiltrate suspicious for pneumonia and patient was discharged on Augmentin and azithromycin.  Over the last 3 days however patient symptoms getting worse and he also developed frequent nauseous vomiting and his suspect at least some of antibiotics have been vomited off his stomach, denies any abdominal pain, no diarrhea.  Yesterday he started to develop fever Tmax 101.0   ED Course: Temperature 100.0, borderline tachycardia pulm and tachypneic O2 saturation 98% on room air.  Chest x-ray showed worsening of LLL infiltrates suspicious for worsening of pneumonia.  WBC 2.7, creatinine 0.9, K3.5, glucose 162.  Lactic acid 0.9   Code sepsis was called, patient was given vancomycin and cefepime in the ED."     Further hospital course and management as outlined below.   Assessment and Plan: * Sepsis due to pneumonia (HCC) Sepsis POA as evidenced by fever, leukopenia, source of infection is worsening of LLL pneumonia failed  outpatient management with PO abx. --Initially on IV Zosyn, Zithromax --Zosyn dc'd per ID, low suspicion for bacterial process We will continue to take sputum culture if able to collect I have also personally reviewed patient CT scan of the chest results showing mild multifocal pneumonia more prominent in the left lower lobe  --Mucinex scheduled, PRN Tussionex Monitor saturation closely   Atypical pneumonia I have also personally reviewed patient CT scan of the chest results showing mild multifocal pneumonia more prominent in the left lower lobe Infectious disease on board and working outpatient for toxoplasmosis as well as PCP as patient has low CD4 count Pulmonologist have been consulted for possible bronc with bronchioloalveolar lavage analysis Will continue to monitor closely Personally discussed the case with infectious disease as well as pulmonologist today   Diabetic gastroparesis (HCC) Zofran PRN for N/V   HIV test positive (HCC) HIV screen is reactive Pt and his male partner have been informed.  CD-4 count very low at 22. --Infectious disease consulted, appreciate input --This raises possibility for opportunistic infection as cause of pneumonia.  Further evaluation as per ID - see recommendations --Follow pending HIV 1/2 Ab differentiation / confirmation Infectious disease on board we appreciate input   Type 1 diabetes mellitus without complication (HCC) Continue insulin pump   ADHD Continue Adderall       .      General exam:  Awake and alert in no acute distress HEENT: moist mucus membranes, hearing grossly normal  Respiratory system: improved aeration today, clear b/l, no wheezes or rhonchi, normal respiratory effort at rest, on room air,  deep inspirations trigger coughing. Cardiovascular system: normal S1/S2, RRR, no edema Central nervous system: Nonfocal communicates appropriately Extremities: moves all, no edema, normal tone Skin: dry, intact, normal  temperature, normal color, No rashes, lesions or ulcers Psychiatry: Normal mood   Data Reviewed:      Latest Ref Rng & Units 12/02/2022    5:04 AM 12/01/2022    4:30 AM 11/30/2022    3:31 PM  CBC  WBC 4.0 - 10.5 K/uL 3.1  2.3  3.1   Hemoglobin 13.0 - 17.0 g/dL 96.0  45.4  09.8   Hematocrit 39.0 - 52.0 % 30.4  30.9  36.7   Platelets 150 - 400 K/uL 129  143  148        Latest Ref Rng & Units 12/01/2022    4:30 AM 11/28/2022   11:46 PM 11/25/2022   10:05 PM  BMP  Glucose 70 - 99 mg/dL 119  147  829   BUN 6 - 20 mg/dL 8  <5  8   Creatinine 5.62 - 1.24 mg/dL 1.30  8.65  7.84   Sodium 135 - 145 mmol/L 140  135  135   Potassium 3.5 - 5.1 mmol/L 3.6  3.5  3.7   Chloride 98 - 111 mmol/L 105  102  106   CO2 22 - 32 mmol/L 28  24  23    Calcium 8.9 - 10.3 mg/dL 8.4  7.8  8.0     Vitamin B12 179 (ref 180-914) Negative respiratory viral panel   HIV screen - reactive CD 4 count low 22 LDH normal RPR non-reactive   I have also personally reviewed patient CT scan of the chest results showing mild multifocal pneumonia more prominent in the left lower lobe   Family Communication: None present on rounds. Pt is able to update.   Disposition: Status is: Inpatient Remains inpatient appropriate because: Severity of illness, ongoing evaluation    Planned Discharge Destination: Home       Time spent: 40 minutes spent taking care of this patient involving above data review as well as coordinating care with infectious disease, pulmonologist as well as patient and transition of care manager     Vitals:   12/02/22 0015 12/02/22 0740 12/02/22 1354 12/02/22 1440  BP: 98/61 109/69 125/83 118/73  Pulse: 94 (!) 103 (!) 116 (!) 110  Resp: 17 15 17 17   Temp:  98.6 F (37 C) 99.5 F (37.5 C) 99.5 F (37.5 C)  TempSrc:    Oral  SpO2: 98% 98% 99% 100%  Weight:      Height:         Author: Loyce Dys, MD 12/02/2022 4:29 PM  For on call review [email or web address].

## 2022-12-02 NOTE — Plan of Care (Signed)

## 2022-12-02 NOTE — Consult Note (Signed)
NAME:  Thomas Neeson., MRN:  [id number], DOB:  04/26/2000, LOS: 3 ADMISSION DATE:  11/29/2022, CONSULTATION DATE:  12/02/2022 REFERRING MD:  Rosezetta Schlatter, MD, CHIEF COMPLAINT:  HIV/AIDS   History of Present Illness:   The patient is a pleasant 22 year old male presenting to the hospital with increased shortness of breath and is newly diagnosed with AIDS.  Patient reports symptoms of exertional dyspnea as well as a cough that started around a week prior to presentation.  He was seen in urgent care where he was diagnosed with bronchitis/pneumonia and given antibiotics and steroids.  He was then seen in the ER and again given antibiotics.  He was discharged and continued to have worsening symptoms of cough, shortness of breath, nausea, and vomiting prompting presentation again to the ED.  Patient was admitted to the hospital on 11/29/2022 initially for management of sepsis secondary to left lower lobe pneumonia.  Workup included an HIV test that turned out positive with a CD4 count of 22 for which infectious diseases were consulted.  Patient also underwent a CT scan of the chest showing infiltrates in the left lower lobe.  Patient was seen by infectious diseases with plan to start Biktarvy and pulmonary is consulted for evaluation of his left lower lobe opacities.  Today, patient reports feeling better compared to when he was admitted.  His shortness of breath is improved and he does not have any cough.  He denies any chest pain, chest tightness, hemoptysis, sputum production, fevers, or chills.  He did have a fever prior to this admission.  Patient reports a history of vaping nicotine products but denies any cigarette smoking or illicit drug use.  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  He does not endorse any occupational exposures.  He does have a cat.  Pertinent  Medical History   -Type 1 diabetes -Diabetic gastroparesis -ADHD -HIV/AIDS  Objective   Blood pressure  118/73, pulse (!) 110, temperature 99.5 F (37.5 C), temperature source Oral, resp. rate 17, height 5\' 6"  (1.676 m), weight 66.5 kg, SpO2 100%.        Intake/Output Summary (Last 24 hours) at 12/02/2022 1702 Last data filed at 12/02/2022 1536 Gross per 24 hour  Intake 1020 ml  Output --  Net 1020 ml   Filed Weights   11/28/22 2344 12/01/22 0448  Weight: 64 kg 66.5 kg    Examination: Physical Exam Vitals reviewed.  Constitutional:      General: He is not in acute distress.    Appearance: He is not ill-appearing.  HENT:     Head: Normocephalic.     Mouth/Throat:     Mouth: Mucous membranes are moist.  Cardiovascular:     Rate and Rhythm: Normal rate and regular rhythm.     Pulses: Normal pulses.     Heart sounds: Normal heart sounds.  Pulmonary:     Effort: Pulmonary effort is normal.     Breath sounds: Normal breath sounds. No wheezing or rales.  Abdominal:     General: There is no distension.     Palpations: Abdomen is soft.  Musculoskeletal:        General: Normal range of motion.     Right lower leg: No edema.     Left lower leg: No edema.  Neurological:     General: No focal deficit present.     Mental Status: He is alert and oriented to person, place, and time. Mental status is at  baseline.      Assessment & Plan:   #[facility] Pulmonary Infiltrate #HIV/AIDS #Concern Opportunistic Infection  Patient is a 22 year old male with a past medical history of type 1 diabetes and newly diagnosed HIV/AIDS who presents to the hospital with increased shortness of breath, cough, and findings concerning for left lower lobe pneumonia.  With this new diagnosis of AIDS with a CD4 of 22, there is high suspicion for an opportunistic infection, especially PJP or toxoplasma.  Review of his chest CT from 12/01/2022 was notable for patchy consolidative opacities, more so in the left lower lobe with minimal involvement in the right lower lobe and lingula.  There is also mosaic attenuation  again most notable in the bilateral lower lobes with some involvement in the lingula.  Given patient's severe immunosuppression and AIDS, it is reasonable to proceed with fiberoptic bronchoscopy with bronchoalveolar lavage to assess and rule out opportunistic infections such as PJP, CMV, toxoplasma, TB, NTM, nocardia, actinomyces, and fungi (including Aspergillus and endemic fungi).  Other etiologies lower on the differential include lymphoma, Kaposi's sarcoma, and idiopathic interstitial pneumonias.  Patient is currently appropriate for flexible bronchoscopy with bronchoalveolar lavage.  I met with him and his partner at the bedside and all the questions were answered.  Finally, I would recommend an echocardiogram to rule out any RV dysfunction or pulmonary hypertension given newly diagnosed HIV.  -flexible bronchoscopy tomorrow -NPO after midnight -hold anti-coagulation -TTE  Raechel Chute, MD [facility] 12/02/2022 5:17 PM    Labs   CBC: Recent Labs  Lab 11/25/22 2205 11/28/22 2346 11/30/22 0350 11/30/22 1531 12/01/22 0430 12/02/22 0504  WBC 5.7 2.5* 2.7* 3.1* 2.3* 3.1*  NEUTROABS 3.6  --   --  1.5  --   --   HGB 12.2* 11.1* 9.5* 11.9* 10.4* 10.3*  HCT 37.0* 34.0* 29.4* 36.7* 30.9* 30.4*  MCV 81.9 81.0 80.1 82 79.2* 79.6*  PLT 128* 166 120* 148* 143* 129*    Basic Metabolic Panel: Recent Labs  Lab 11/25/22 2205 11/28/22 2346 12/01/22 0430  NA 135 135 140  K 3.7 3.5 3.6  CL 106 102 105  CO2 23 24 28   GLUCOSE 160* 162* 131*  BUN 8 <5* 8  CREATININE 0.74 0.79 0.65  CALCIUM 8.0* 7.8* 8.4*   GFR: Estimated Creatinine Clearance: 128.5 mL/min (by C-G formula based on SCr of 0.65 mg/dL). Recent Labs  Lab 11/25/22 2333 11/28/22 2346 11/29/22 0515 11/29/22 0807 11/30/22 0350 11/30/22 1531 12/01/22 0430 12/02/22 0504  PROCALCITON  --   --   --   --   --  <0.10  --   --   WBC  --    < >  --   --  2.7* 3.1* 2.3* 3.1*  LATICACIDVEN 0.8  --  0.7  0.7  --   --   --   --    < > = values in this interval not displayed.    Liver Function Tests: Recent Labs  Lab 11/25/22 2205 11/28/22 2346  AST 18 19  ALT 11 13  ALKPHOS 55 53  BILITOT 0.4 0.6  PROT 7.5 7.3  ALBUMIN 2.8* 2.6*   Recent Labs  Lab 11/28/22 2346  LIPASE 26   No results for input(s): "AMMONIA" in the last 168 hours.  ABG    Component Value Date/Time   PHART 7.27 (L) 05/28/2015 0221   PCO2ART 26 (L) 05/28/2015 0221   PO2ART 118 (H) 05/28/2015 0221   HCO3 28.5 (H) 10/07/2020  0040   ACIDBASEDEF 1.1 07/03/2019 1409   O2SAT 95.7 10/07/2020 0040     Coagulation Profile: No results for input(s): "INR", "PROTIME" in the last 168 hours.  Cardiac Enzymes: No results for input(s): "CKTOTAL", "CKMB", "CKMBINDEX", "TROPONINI" in the last 168 hours.  HbA1C: Hgb A1c MFr Bld  Date/Time Value Ref Range Status  07/04/2019 06:45 AM 11.8 (H) 4.8 - 5.6 % Final    Comment:    (NOTE) Pre diabetes:          5.7%-6.4% Diabetes:              >6.4% Glycemic control for   <7.0% adults with diabetes   02/10/2016 03:03 AM 11.8 (H) 4.8 - 5.6 % Final    Comment:    (NOTE)         Pre-diabetes: 5.7 - 6.4         Diabetes: >6.4         Glycemic control for adults with diabetes: <7.0     CBG: Recent Labs  Lab 12/01/22 1645 12/01/22 2102 12/02/22 0149 12/02/22 0740 12/02/22 1119  GLUCAP 307* 324* 190* 175* 131*    Review of Systems:   Review of Systems  Constitutional:  Negative for chills, fever and weight loss.  Respiratory:  Positive for cough and shortness of breath. Negative for sputum production and wheezing.   Cardiovascular:  Negative for chest pain.  Skin:  Negative for rash.     Past Medical History:  He,  has a past medical history of ADHD (attention deficit hyperactivity disorder), AKI (acute kidney injury) (HCC) (02/09/2016), Diabetes mellitus without complication (HCC), DKA (diabetic ketoacidoses) (03/17/2015), and Tachycardia (07/04/2019).    Surgical History:   Past Surgical History:  Procedure Laterality Date   ESOPHAGOGASTRODUODENOSCOPY (EGD) WITH PROPOFOL N/A 02/25/2021   Procedure: ESOPHAGOGASTRODUODENOSCOPY (EGD) WITH PROPOFOL;  Surgeon: Toney Reil, MD;  Location: [facility];  Service: Gastroenterology;  Laterality: N/A;   NO PAST SURGERIES       Social History:   reports that he has never smoked. He has never used smokeless tobacco. He reports that he does not drink alcohol and does not use drugs.   Family History:  His family history includes Hypertension in an other family member.   Allergies No Known Allergies   Home Medications  Prior to Admission medications   Medication Sig Start Date End Date Taking? Authorizing Provider  albuterol (VENTOLIN HFA) 108 (90 Base) MCG/ACT inhaler Inhale 2 puffs into the lungs every 4 (four) hours as needed for wheezing or shortness of breath. 11/26/22  Yes Irean Hong, MD  amoxicillin-clavulanate (AUGMENTIN) 875-125 MG tablet Take 1 tablet by mouth 2 (two) times daily. 11/26/22  Yes Irean Hong, MD  amphetamine-dextroamphetamine (ADDERALL XR) 10 MG 24 hr capsule Take 10 mg by mouth every morning.    Yes [provider]  amphetamine-dextroamphetamine (ADDERALL XR) 30 MG 24 hr capsule Take 30 mg by mouth daily before breakfast.   Yes [provider]  cetirizine (ZYRTEC) 10 MG tablet Take 10 mg by mouth daily as needed.   Yes [provider]  chlorpheniramine-HYDROcodone (TUSSIONEX) 10-8 MG/5ML Take 5 mLs by mouth every 12 (twelve) hours as needed for cough. 11/26/22  Yes Irean Hong, MD  famotidine (PEPCID) 20 MG tablet Take 1 tablet (20 mg total) by mouth 2 (two) times daily. 09/17/21  Yes Sharman Cheek, MD  HUMALOG 100 UNIT/ML injection 100 units daily to be used in in the patients insulin pump  07/03/22  Yes [provider]  hydrOXYzine (ATARAX) 25 MG tablet Take 1 tablet (25 mg total) by mouth 3 (three) times daily as needed for  anxiety. 09/14/21  Yes Minna Antis, MD  metoCLOPramide (REGLAN) 10 MG tablet Take 1 tablet (10 mg total) by mouth every 6 (six) hours as needed. 09/17/21  Yes Sharman Cheek, MD  naproxen (NAPROSYN) 500 MG tablet Take 1 tablet (500 mg total) by mouth 2 (two) times daily with a meal. Patient taking differently: Take 500 mg by mouth 2 (two) times daily as needed. 09/17/21  Yes Sharman Cheek, MD  promethazine (PHENERGAN) 25 MG tablet Take 1 tablet (25 mg total) by mouth every 6 (six) hours as needed for nausea or vomiting. 07/30/21  Yes Willy Eddy, MD  azithromycin (ZITHROMAX) 250 MG tablet Take by mouth. Patient not taking: Reported on 11/29/2022 11/25/22   [provider]  blood glucose meter kit and supplies KIT Dispense based on patient and insurance preference. Use up to four times daily as directed. (FOR ICD-9 250.00, 250.01). 07/04/19   Zigmund Daniel., MD  insulin aspart (NOVOLOG) 100 UNIT/ML FlexPen Inject 8 Units into the skin 3 (three) times daily with meals. Patient not taking: Reported on 11/29/2022 07/04/19   Zigmund Daniel., MD  insulin degludec (TRESIBA) 100 UNIT/ML FlexTouch Pen Inject into the skin. Patient not taking: Reported on 02/25/2021 02/03/16   [provider]  Insulin Pen Needle 32G X 4 MM MISC Use with insulin 07/04/19   Zigmund Daniel., MD  NOVOLOG MIX 70/30 FLEXPEN (70-30) 100 UNIT/ML FlexPen Inject into the skin. Patient not taking: Reported on 11/29/2022 01/24/21   [provider]      I spent 80 minutes caring for this patient today, including preparing to see the patient, obtaining a medical history , reviewing a separately obtained history, performing a medically appropriate examination and/or evaluation, counseling and educating the patient/family/caregiver, ordering medications, tests, or procedures, referring and communicating with other health care professionals (not separately reported), documenting clinical  information in the electronic health record, and independently interpreting results (not separately reported/billed) and communicating results to the patient/family/caregiver

## 2022-12-03 ENCOUNTER — Other Ambulatory Visit: Payer: Self-pay

## 2022-12-03 ENCOUNTER — Encounter: Payer: Self-pay | Admitting: Internal Medicine

## 2022-12-03 ENCOUNTER — Encounter
Admission: EM | Disposition: A | Discharge status: Home / Self Care | Payer: Self-pay | Source: Home / Self Care | Attending: Internal Medicine

## 2022-12-03 ENCOUNTER — Inpatient Hospital Stay (HOSPITAL_COMMUNITY)
Admit: 2022-12-03 | Discharge: 2022-12-03 | Disposition: A | Discharge status: Home / Self Care | Payer: Medicaid Other | Attending: Student in an Organized Health Care Education/Training Program | Admitting: Student in an Organized Health Care Education/Training Program

## 2022-12-03 ENCOUNTER — Inpatient Hospital Stay: Payer: Medicaid Other | Admitting: Anesthesiology

## 2022-12-03 ENCOUNTER — Inpatient Hospital Stay: Payer: Medicaid Other

## 2022-12-03 DIAGNOSIS — J189 Pneumonia, unspecified organism: Secondary | ICD-10-CM | POA: Diagnosis not present

## 2022-12-03 DIAGNOSIS — E109 Type 1 diabetes mellitus without complications: Secondary | ICD-10-CM | POA: Diagnosis not present

## 2022-12-03 DIAGNOSIS — J180 Bronchopneumonia, unspecified organism: Secondary | ICD-10-CM

## 2022-12-03 DIAGNOSIS — A419 Sepsis, unspecified organism: Secondary | ICD-10-CM | POA: Diagnosis not present

## 2022-12-03 DIAGNOSIS — B20 Human immunodeficiency virus [HIV] disease: Secondary | ICD-10-CM | POA: Diagnosis not present

## 2022-12-03 DIAGNOSIS — R0609 Other forms of dyspnea: Secondary | ICD-10-CM

## 2022-12-03 DIAGNOSIS — J181 Lobar pneumonia, unspecified organism: Secondary | ICD-10-CM | POA: Diagnosis not present

## 2022-12-03 HISTORY — PX: FLEXIBLE BRONCHOSCOPY: SHX5094

## 2022-12-03 LAB — BASIC METABOLIC PANEL
Anion gap: 9 (ref 5–15)
BUN: 9 mg/dL (ref 6–20)
CO2: 24 mmol/L (ref 22–32)
Calcium: 8.1 mg/dL — ABNORMAL LOW (ref 8.9–10.3)
Chloride: 105 mmol/L (ref 98–111)
Creatinine, Ser: 0.69 mg/dL (ref 0.61–1.24)
GFR, Estimated: >60 mL/min (ref >60)
Glucose, Bld: 171 mg/dL — ABNORMAL HIGH (ref 70–99)
Potassium: 3.5 mmol/L (ref 3.5–5.1)
Sodium: 138 mmol/L (ref 135–145)

## 2022-12-03 LAB — GLUCOSE, CAPILLARY
Glucose-Capillary: 145 mg/dL — ABNORMAL HIGH (ref 70–99)
Glucose-Capillary: 129 mg/dL — ABNORMAL HIGH (ref 70–99)
Glucose-Capillary: 143 mg/dL — ABNORMAL HIGH (ref 70–99)
Glucose-Capillary: 125 mg/dL — ABNORMAL HIGH (ref 70–99)

## 2022-12-03 LAB — CBC WITH DIFFERENTIAL/PLATELET
Abs Immature Granulocytes: 0 10*3/uL (ref 0.00–0.07)
Basophils Absolute: 0 10*3/uL (ref 0.0–0.1)
Basophils Relative: 1 %
Eosinophils Absolute: 0.1 10*3/uL (ref 0.0–0.5)
Eosinophils Relative: 2 %
HCT: 31.9 % — ABNORMAL LOW (ref 39.0–52.0)
Hemoglobin: 10.5 g/dL — ABNORMAL LOW (ref 13.0–17.0)
Immature Granulocytes: 0 %
Lymphocytes Relative: 30 %
Lymphs Abs: 0.8 10*3/uL (ref 0.7–4.0)
MCH: 26.5 pg (ref 26.0–34.0)
MCHC: 32.9 g/dL (ref 30.0–36.0)
MCV: 80.6 fL (ref 80.0–100.0)
Monocytes Absolute: 0.5 10*3/uL (ref 0.1–1.0)
Monocytes Relative: 18 %
Neutro Abs: 1.3 10*3/uL — ABNORMAL LOW (ref 1.7–7.7)
Neutrophils Relative %: 49 %
Platelets: 132 10*3/uL — ABNORMAL LOW (ref 150–400)
RBC: 3.96 MIL/uL — ABNORMAL LOW (ref 4.22–5.81)
RDW: 12.5 % (ref 11.5–15.5)
WBC: 2.7 10*3/uL — ABNORMAL LOW (ref 4.0–10.5)
nRBC: 0 % (ref 0.0–0.2)

## 2022-12-03 LAB — ECHOCARDIOGRAM COMPLETE
AR max vel: 3.17 cm2
AV Area VTI: 3.99 cm2
AV Area mean vel: 3.17 cm2
AV Mean grad: 3 mmHg
AV Peak grad: 4.2 mmHg
Ao pk vel: 1.02 m/s
Area-P 1/2: 5.97 cm2
S' Lateral: 2.7 cm
Weight: 2345.69 [oz_av]

## 2022-12-03 LAB — FERRITIN: Ferritin: 223 ng/mL (ref 24–336)

## 2022-12-03 SURGERY — BRONCHOSCOPY, FLEXIBLE
Anesthesia: General | Laterality: Bilateral

## 2022-12-03 MED ORDER — ROCURONIUM BROMIDE 100 MG/10ML IV SOLN
INTRAVENOUS | Status: DC | PRN
  Administered 2022-12-03: 20 mg via INTRAVENOUS

## 2022-12-03 MED ORDER — CYANOCOBALAMIN 1000 MCG PO TABS: 1000.0000 ug | ORAL_TABLET | Freq: Every day | ORAL | 0 refills | Status: AC

## 2022-12-03 MED ORDER — LIDOCAINE HCL (CARDIAC) PF 100 MG/5ML IV SOSY
PREFILLED_SYRINGE | INTRAVENOUS | Status: DC | PRN
  Administered 2022-12-03: 60 mg via INTRAVENOUS

## 2022-12-03 MED ORDER — SUCCINYLCHOLINE CHLORIDE 200 MG/10ML IV SOSY
PREFILLED_SYRINGE | INTRAVENOUS | Status: DC | PRN
  Administered 2022-12-03: 60 mg via INTRAVENOUS

## 2022-12-03 MED ORDER — SODIUM CHLORIDE 0.9 % IV SOLN: INTRAVENOUS | Status: DC | PRN

## 2022-12-03 MED ORDER — ONDANSETRON HCL 4 MG PO TABS
4.0000 mg | ORAL_TABLET | Freq: Three times a day (TID) | ORAL | 0 refills | Status: DC | PRN
  Filled 2022-12-03: qty 15, 5d supply, fill #0

## 2022-12-03 MED ORDER — SUGAMMADEX SODIUM 200 MG/2ML IV SOLN
INTRAVENOUS | Status: DC | PRN
  Administered 2022-12-03: 200 mg via INTRAVENOUS

## 2022-12-03 MED ORDER — PROPOFOL 1000 MG/100ML IV EMUL
INTRAVENOUS | Status: AC
  Filled 2022-12-03: qty 100

## 2022-12-03 MED ORDER — PROPOFOL 10 MG/ML IV BOLUS
INTRAVENOUS | Status: AC
  Filled 2022-12-03: qty 40

## 2022-12-03 MED ORDER — SULFAMETHOXAZOLE-TRIMETHOPRIM 800-160 MG PO TABS: 2.0000 | ORAL_TABLET | Freq: Three times a day (TID) | ORAL | Status: DC

## 2022-12-03 MED ORDER — SULFAMETHOXAZOLE-TRIMETHOPRIM 800-160 MG PO TABS
2.0000 | ORAL_TABLET | Freq: Three times a day (TID) | ORAL | 0 refills | Status: AC
  Filled 2022-12-03: qty 126, 21d supply, fill #0

## 2022-12-03 MED ORDER — SULFAMETHOXAZOLE-TRIMETHOPRIM 800-160 MG PO TABS
1.0000 | ORAL_TABLET | Freq: Every day | ORAL | 1 refills | Status: DC
  Filled 2022-12-03: qty 30, 30d supply, fill #0

## 2022-12-03 MED ORDER — VITAMIN B-12 1000 MCG PO TABS: 1000.0000 ug | ORAL_TABLET | Freq: Every day | ORAL | Status: DC

## 2022-12-03 MED ORDER — FENTANYL CITRATE (PF) 100 MCG/2ML IJ SOLN: 25.0000 ug | INTRAMUSCULAR | Status: DC | PRN

## 2022-12-03 MED ORDER — BICTEGRAVIR-EMTRICITAB-TENOFOV 50-200-25 MG PO TABS
1.0000 | ORAL_TABLET | Freq: Every day | ORAL | Status: DC
  Administered 2022-12-03: 1 via ORAL
  Filled 2022-12-03: qty 1

## 2022-12-03 MED ORDER — PROPOFOL 500 MG/50ML IV EMUL
INTRAVENOUS | Status: DC | PRN
Start: 2022-12-03 — End: 2022-12-03
  Administered 2022-12-03: 125 ug/kg/min via INTRAVENOUS

## 2022-12-03 MED ORDER — ONDANSETRON HCL 4 MG/2ML IJ SOLN
INTRAMUSCULAR | Status: DC | PRN
  Administered 2022-12-03: 4 mg via INTRAVENOUS

## 2022-12-03 MED ORDER — MIDAZOLAM HCL 2 MG/2ML IJ SOLN
INTRAMUSCULAR | Status: AC
  Filled 2022-12-03: qty 2

## 2022-12-03 MED ORDER — OXYCODONE HCL 5 MG/5ML PO SOLN: 5.0000 mg | Freq: Once | ORAL | Status: DC | PRN

## 2022-12-03 MED ORDER — FENTANYL CITRATE (PF) 100 MCG/2ML IJ SOLN
INTRAMUSCULAR | Status: DC | PRN
  Administered 2022-12-03 (×2): 50 ug via INTRAVENOUS

## 2022-12-03 MED ORDER — SULFAMETHOXAZOLE-TRIMETHOPRIM 800-160 MG PO TABS
1.0000 | ORAL_TABLET | Freq: Every day | ORAL | Status: DC
  Administered 2022-12-03: 1 via ORAL
  Filled 2022-12-03: qty 1

## 2022-12-03 MED ORDER — FENTANYL CITRATE (PF) 100 MCG/2ML IJ SOLN
INTRAMUSCULAR | Status: AC
  Filled 2022-12-03: qty 2

## 2022-12-03 MED ORDER — OXYCODONE HCL 5 MG PO TABS: 5.0000 mg | ORAL_TABLET | Freq: Once | ORAL | Status: DC | PRN

## 2022-12-03 MED ORDER — PROPOFOL 10 MG/ML IV BOLUS
INTRAVENOUS | Status: DC | PRN
  Administered 2022-12-03: 150 mg via INTRAVENOUS

## 2022-12-03 MED ORDER — MIDAZOLAM HCL 2 MG/2ML IJ SOLN
INTRAMUSCULAR | Status: DC | PRN
  Administered 2022-12-03: 2 mg via INTRAVENOUS

## 2022-12-03 MED ORDER — CYANOCOBALAMIN 1000 MCG/ML IJ SOLN
1000.0000 ug | Freq: Once | INTRAMUSCULAR | Status: DC
  Filled 2022-12-03: qty 1

## 2022-12-03 MED ORDER — BICTEGRAVIR-EMTRICITAB-TENOFOV 50-200-25 MG PO TABS
1.0000 | ORAL_TABLET | Freq: Every day | ORAL | 1 refills | Status: AC
  Filled 2022-12-03: qty 30, 30d supply, fill #0

## 2022-12-03 MED ORDER — DEXAMETHASONE SODIUM PHOSPHATE 10 MG/ML IJ SOLN
INTRAMUSCULAR | Status: DC | PRN
  Administered 2022-12-03: 5 mg via INTRAVENOUS

## 2022-12-03 NOTE — Anesthesia Procedure Notes (Signed)
Procedure Name: Intubation Date/Time: 12/03/2022 12:42 PM  Performed by: Karoline Caldwell, CRNAPre-anesthesia Checklist: Patient identified, Patient being monitored, Timeout performed, Emergency Drugs available and Suction available Patient Re-evaluated:Patient Re-evaluated prior to induction Oxygen Delivery Method: Circle system utilized Preoxygenation: Pre-oxygenation with 100% oxygen Induction Type: IV induction Ventilation: Mask ventilation without difficulty Laryngoscope Size: McGraph and 4 Grade View: Grade I Tube type: Oral Tube size: 8.5 mm Number of attempts: 1 Airway Equipment and Method: Stylet Placement Confirmation: ETT inserted through vocal cords under direct vision, positive ETCO2 and breath sounds checked- equal and bilateral Secured at: 22 cm Tube secured with: Tape Dental Injury: Teeth and Oropharynx as per pre-operative assessment

## 2022-12-03 NOTE — Transfer of Care (Signed)
Immediate Anesthesia Transfer of Care Note  Patient: Thomas Maddox.  Procedure(s) Performed: FLEXIBLE BRONCHOSCOPY (Bilateral)  Patient Location: PACU  Anesthesia Type:General  Level of Consciousness: awake, alert , and oriented  Airway & Oxygen Therapy: Patient Spontanous Breathing  Post-op Assessment: Report given to RN and Post -op Vital signs reviewed and stable  Post vital signs: Reviewed and stable  Last Vitals:  Vitals Value Taken Time  BP 117/82 12/03/22 1315  Temp    Pulse 101 12/03/22 1326  Resp 18 12/03/22 1326  SpO2 94 % 12/03/22 1326  Vitals shown include unfiled device data.  Last Pain:  Vitals:   12/03/22 1311  TempSrc:   PainSc: Asleep      Patients Stated Pain Goal: 0 (12/03/22 1231)  Complications: No notable events documented.

## 2022-12-03 NOTE — Plan of Care (Signed)

## 2022-12-03 NOTE — Interval H&P Note (Signed)
Patient admitted with pneumonia, found to have HIV/AIDS. Plan is for flexible bronchoscopy with BAL to rule out opportunistic infections given immune suppression. Patient is appropriate for the procedure.  Raechel Chute, MD [facility] 12/03/2022 11:47 AM

## 2022-12-03 NOTE — Progress Notes (Addendum)
Date of Admission:  11/29/2022      ID: Thomas Maddox. is a 22 y.o. male  Principal Problem:   Sepsis due to pneumonia [facility]) Active Problems:   ADHD   Type 1 diabetes mellitus without complication (HCC)   Atypical pneumonia   HIV test positive (HCC)   Diabetic gastroparesis (HCC)   AIDS (acquired immune deficiency syndrome) (HCC)   Bronchopneumonia    Subjective: Pt had bronch today Doing okay No sob Partner Thomas Maddox at bed side  Objective: Vital signs in last 24 hours: Patient Vitals for the past 24 hrs:  BP Temp Temp src Pulse Resp SpO2 Height Weight  12/03/22 1345 117/84 -- -- 97 (!) 23 97 % -- --  12/03/22 1340 -- -- -- (!) 107 (!) 28 96 % -- --  12/03/22 1335 -- -- -- 100 (!) 26 96 % -- --  12/03/22 1330 110/87 -- -- (!) 103 20 97 % -- --  12/03/22 1325 -- -- -- 99 19 95 % -- --  12/03/22 1320 -- -- -- 99 17 96 % -- --  12/03/22 1315 117/82 -- -- 96 18 97 % -- --  12/03/22 1311 113/76 (!) 97.3 F (36.3 C) -- 95 18 97 % -- --  12/03/22 1231 108/69 98.7 F (37.1 C) Oral 98 18 98 % 5\' 6"  (1.676 m) 66.5 kg  12/03/22 0714 102/72 98.7 F (37.1 C) -- 98 17 100 % -- --  12/02/22 2150 113/75 99 F (37.2 C) -- 99 20 99 % -- --  12/02/22 1440 118/73 99.5 F (37.5 C) Oral (!) 110 17 100 % -- --     PHYSICAL EXAM:  General: Alert, oriented Lungs: b/l air entry Heart: Tachycardia Abdomen: Soft, non-tender,not distended. Bowel sounds normal. No masses Extremities: atraumatic, no cyanosis. No edema. No clubbing Skin: No rashes or lesions. Or bruising Lymph: Cervical, supraclavicular normal. Neurologic: Grossly non-focal  Lab Results    Latest Ref Rng & Units 12/03/2022    3:01 AM 12/02/2022    5:04 AM 12/01/2022    4:30 AM  CBC  WBC 4.0 - 10.5 K/uL 2.7  3.1  2.3   Hemoglobin 13.0 - 17.0 g/dL 29.5  62.1  30.8   Hematocrit 39.0 - 52.0 % 31.9  30.4  30.9   Platelets 150 - 400 K/uL 132  129  143        Latest Ref Rng & Units 12/03/2022    3:01 AM 12/01/2022     4:30 AM 11/28/2022   11:46 PM  CMP  Glucose 70 - 99 mg/dL 657  846  962   BUN 6 - 20 mg/dL 9  8  <5   Creatinine 9.52 - 1.24 mg/dL 8.41  3.24  4.01   Sodium 135 - 145 mmol/L 138  140  135   Potassium 3.5 - 5.1 mmol/L 3.5  3.6  3.5   Chloride 98 - 111 mmol/L 105  105  102   CO2 22 - 32 mmol/L 24  28  24    Calcium 8.9 - 10.3 mg/dL 8.1  8.4  7.8   Total Protein 6.5 - 8.1 g/dL   7.3   Total Bilirubin 0.3 - 1.2 mg/dL   0.6   Alkaline Phos 38 - 126 U/L   53   AST 15 - 41 U/L   19   ALT 0 - 44 U/L   13       Microbiology: Bc ng Hiv reactive Vl  314K Cd4 is 22 Toxo igG 64  Studies/Results: CT CHEST WO CONTRAST  Result Date: 12/01/2022 CLINICAL DATA:  Pneumonia EXAM: CT CHEST WITHOUT CONTRAST TECHNIQUE: Multidetector CT imaging of the chest was performed following the standard protocol without IV contrast. RADIATION DOSE REDUCTION: This exam was performed according to the departmental dose-optimization program which includes automated exposure control, adjustment of the mA and/or kV according to patient size and/or use of iterative reconstruction technique. COMPARISON:  Chest radiograph dated 11/29/2022. CTA chest dated 10/31/2020. FINDINGS: Cardiovascular: The heart is normal in size. No pericardial effusion. No evidence of thoracic aortic aneurysm. Mediastinum/Nodes: No suspicious mediastinal lymphadenopathy. Visualized thyroid is unremarkable. Lungs/Pleura: Faint ground-glass/patchy opacities in the lingula and bilateral lower lobes, left lower lobe predominant, suggesting mild multifocal pneumonia. No suspicious pulmonary nodules. Trace bilateral pleural fluid.  No pneumothorax. Upper Abdomen: Visualized upper abdomen is grossly unremarkable. Musculoskeletal: Visualized osseous structures are within normal limits. IMPRESSION: Mild multifocal pneumonia, left lower lobe predominant. Trace bilateral pleural effusions. Electronically Signed   By: Charline Bills M.D.   On: 12/01/2022 18:48      Assessment/Plan: AIDS- newly diagnosed-  Cd4 22  VL 316K Will start Biktarvy   Sob on admission- improved significantly Minimal GGO Underwent bronch To r/o PJP /Toxo/fungal LDH N  Toxo IgG is high at 64, IgM neg Bactrim DS 1 a day for prophylaxis against toxo /PCP If either of the tests in BAL turn positive will treat accordingly  Pancytopenia could be from AIDS - also checked histoplasma, CMV, MAI Will follow   IDDM on insulin  ADHD on adderall  Hydoxyzine for anxiety  Discussed the management with the patient and his partner Explained side effects of meds Discussed with care team Will follow as OP Appt given for 3 weeks  Addendum 5pm Beta D glucan> 500  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 Will need labs 9 CBC/CMP) next week Updated patient and partner

## 2022-12-03 NOTE — Progress Notes (Signed)
1745 D/C AVS completed and reviewed with pt. All opportunities for questions answered and clarified. IV removed. Pt will be wheeled down to car at medical mall entrance via wheelchair. Meds given to pt

## 2022-12-03 NOTE — Telephone Encounter (Signed)
Since this was considered as inpatient I had to fax records trying to get the bronch approved

## 2022-12-03 NOTE — Op Note (Signed)
Bronchoscopy Procedure Note  Thomas Maddox  [id number]  April 25, 2000  Date:12/03/22  Time:12:59 PM   Provider Performing:    Procedure(s):  Flexible Bronchoscopy [phone]) and Flexible bronchoscopy with bronchial alveolar lavage (21308)  Indication(s) AIDS, pulmonary infiltrate  Consent Risks of the procedure as well as the alternatives and risks of each were explained to the patient and/or caregiver.  Consent for the procedure was obtained and is signed in the bedside chart  Anesthesia TIVA   Time Out Verified patient identification, verified procedure, site/side was marked, verified correct patient position, special equipment/implants available, medications/allergies/relevant history reviewed, required imaging and test results available.   Sterile Technique Usual hand hygiene, masks, gowns, and gloves were used   Procedure Description Bronchoscope advanced through endotracheal tube and into airway.  Airways were examined down to subsegmental level with findings noted below.   Following diagnostic evaluation, BAL(s) performed in Lingula with normal saline and return of 120 mL of clear fluid. BAL also performed in the lateral segment of the LLL (LB8) with normal saline and return of 80 mL of fluid.  Findings: Tracheobronchial tree examined to the segmental level bilaterally and noted to be normal. There were no endobronchial lesions, no secretions, and no bleeding. BAL performed in the Lingula and LLL.   Complications/Tolerance None; patient tolerated the procedure well. Chest X-ray is not needed post procedure.   EBL Minimal   Specimen(s) BAL from Lingula BAL from LLL  Raechel Chute, MD [facility] 12/03/2022 1:01 PM

## 2022-12-03 NOTE — Anesthesia Preprocedure Evaluation (Signed)
Anesthesia Evaluation  Patient identified by MRN, date of birth, ID band Patient awake    Reviewed: Allergy & Precautions, NPO status , Patient's Chart, lab work & pertinent test results  History of Anesthesia Complications Negative for: history of anesthetic complications  Airway Mallampati: II  TM Distance: >3 FB Neck ROM: full    Dental no notable dental hx.    Pulmonary pneumonia, unresolved   Pulmonary exam normal        Cardiovascular negative cardio ROS Normal cardiovascular exam     Neuro/Psych negative neurological ROS  negative psych ROS   GI/Hepatic Neg liver ROS,GERD  Controlled,,  Endo/Other  negative endocrine ROSdiabetes, Type 1, Insulin Dependent    Renal/GU Renal disease     Musculoskeletal   Abdominal   Peds  Hematology negative hematology ROS (+) Newly diagnosed AIDS   Anesthesia Other Findings Past Medical History: No date: ADHD (attention deficit hyperactivity disorder) 02/09/2016: AKI (acute kidney injury) (HCC) No date: Diabetes mellitus without complication (HCC) 03/17/2015: DKA (diabetic ketoacidoses) 07/04/2019: Tachycardia  Past Surgical History: 02/25/2021: ESOPHAGOGASTRODUODENOSCOPY (EGD) WITH PROPOFOL; N/A     Comment:  Procedure: ESOPHAGOGASTRODUODENOSCOPY (EGD) WITH               PROPOFOL;  Surgeon: Toney Reil, MD;  Location:               [facility] ENDOSCOPY;  Service: Gastroenterology;  Laterality:               N/A; No date: NO PAST SURGERIES  BMI    Body Mass Index: 23.66 kg/m      Reproductive/Obstetrics negative OB ROS                             Anesthesia Physical Anesthesia Plan  ASA: 3  Anesthesia Plan: General ETT   Post-op Pain Management: Ofirmev IV (intra-op)*, Toradol IV (intra-op)* and Ketamine IV*   Induction: Intravenous  PONV Risk Score and Plan: 2 and Ondansetron, Dexamethasone, Midazolam and Treatment may vary due  to age or medical condition  Airway Management Planned: Oral ETT  Additional Equipment:   Intra-op Plan:   Post-operative Plan: Extubation in OR  Informed Consent: I have reviewed the patients History and Physical, chart, labs and discussed the procedure including the risks, benefits and alternatives for the proposed anesthesia with the patient or authorized representative who has indicated his/her understanding and acceptance.     Dental Advisory Given  Plan Discussed with: Anesthesiologist, CRNA and Surgeon  Anesthesia Plan Comments: (Patient consented for risks of anesthesia including but not limited to:  - adverse reactions to medications - damage to eyes, teeth, lips or other oral mucosa - nerve damage due to positioning  - sore throat or hoarseness - Damage to heart, brain, nerves, lungs, other parts of body or loss of life  Patient voiced understanding.)        Anesthesia Quick Evaluation

## 2022-12-03 NOTE — Discharge Summary (Signed)
Physician Discharge Summary   Patient: Thomas Maddox. 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 is a 22 y.o. male with medical history significant of type 1 diabetes, diabetic gastroparesis, ADHD, presented with worsening of cough shortness of breath and fever.  Patient's HIV test came back negative.  CD4 count critically low and therefore infectious disease was consulted.  Patient was recommended for bronchoscopy and pulmonology after bronc on 12/03/2022.  Results showing findings of PJP pneumonia.  Infectious disease has placed patient on medication and have cleared him for discharge today and to follow-up as an outpatient    Consultants: Pulmonary, infectious disease Procedures performed: Bronchoscopy with bronchoalveolar lavage Disposition: Home Diet recommendation:  Discharge Diet Orders (From admission, onward)     Start     Ordered   12/03/22 0000  Diet - low sodium heart healthy        12/03/22 1716           Cardiac diet DISCHARGE MEDICATION: Allergies as of 12/03/2022   No Known Allergies      Medication List     STOP taking these medications    amoxicillin-clavulanate 875-125 MG tablet Commonly known as: AUGMENTIN   azithromycin 250 MG tablet Commonly known as: ZITHROMAX   metoCLOPramide 10 MG tablet Commonly known as: REGLAN       TAKE these medications    albuterol 108 (90 Base) MCG/ACT inhaler Commonly known as: VENTOLIN HFA Inhale 2 puffs into the lungs every 4 (four) hours as needed for wheezing or shortness of  breath.   amphetamine-dextroamphetamine 10 MG 24 hr capsule Commonly known as: ADDERALL XR Take 10 mg by mouth every morning.   amphetamine-dextroamphetamine 30 MG 24 hr capsule Commonly known as: ADDERALL XR Take 30 mg by mouth daily before breakfast.   Biktarvy 50-200-25 MG Tabs tablet Generic drug: bictegravir-emtricitabine-tenofovir AF Take 1 tablet by mouth daily. Start taking on: December 04, 2022   blood glucose meter kit and supplies Kit Dispense based on patient and insurance preference. Use up to four times daily as directed. (FOR ICD-9 250.00, 250.01).   cetirizine 10 MG tablet Commonly known as: ZYRTEC Take 10 mg by mouth daily as needed.   chlorpheniramine-HYDROcodone 10-8 MG/5ML Commonly known as: TUSSIONEX Take 5 mLs by mouth every 12 (twelve) hours as needed for cough.   cyanocobalamin 1000 MCG tablet Take 1 tablet (1,000 mcg total) by mouth daily. Start taking on: December 04, 2022   famotidine 20 MG tablet Commonly known as: PEPCID Take 1 tablet (20 mg total) by mouth 2 (two) times daily.   HumaLOG 100 UNIT/ML injection Generic drug: insulin lispro 100 units daily to be used in in the patients insulin pump   hydrOXYzine 25 MG tablet Commonly known as: ATARAX Take 1 tablet (25 mg total) by mouth 3 (three) times daily as needed for anxiety.   insulin aspart 100 UNIT/ML FlexPen Commonly known as: NOVOLOG Inject 8 Units into the skin 3 (three) times daily with meals.   insulin degludec 100 UNIT/ML FlexTouch Pen Commonly known as: TRESIBA Inject into the skin.   Insulin  Pen Needle 32G X 4 MM Misc Use with insulin   naproxen 500 MG tablet Commonly known as: Naprosyn Take 1 tablet (500 mg total) by mouth 2 (two) times daily with a meal. What changed:  when to take this reasons to take this   NovoLOG Mix 70/30 FlexPen (70-30) 100 UNIT/ML FlexPen Generic drug: insulin aspart protamine - aspart Inject into the skin.   ondansetron 4 MG  tablet Commonly known as: Zofran Take 1 tablet (4 mg total) by mouth every 8 (eight) hours as needed for nausea or vomiting.   promethazine 25 MG tablet Commonly known as: PHENERGAN Take 1 tablet (25 mg total) by mouth every 6 (six) hours as needed for nausea or vomiting.   sulfamethoxazole-trimethoprim 800-160 MG tablet Commonly known as: BACTRIM DS Take 2 tablets by mouth every 8 (eight) hours for 21 days.        Discharge Exam: Filed Weights   11/28/22 2344 12/01/22 0448 12/03/22 1231  Weight: 64 kg 66.5 kg 66.5 kg   Awake and alert in no acute distress HEENT: moist mucus membranes, hearing grossly normal  Respiratory system: improved aeration today, clear b/l, no wheezes or rhonchi, normal respiratory effort at rest, on room air, deep inspirations trigger coughing. Cardiovascular system: normal S1/S2, RRR, no edema Central nervous system: Nonfocal communicates appropriately Extremities: moves all, no edema, normal tone Skin: dry, intact, normal temperature, normal color, No rashes, lesions or ulcers Psychiatry: Normal mood  Condition at discharge: good     Discharge time spent:  .  Signed: Loyce Dys, MD [facility] 12/03/2022

## 2022-12-04 ENCOUNTER — Other Ambulatory Visit: Payer: Self-pay

## 2022-12-04 LAB — BODY FLUID CELL COUNT WITH DIFFERENTIAL
Lymphs, Fluid: 90 %
Lymphs, Fluid: 92 %
Neutrophil Count, Fluid: 10 % (ref 0–25)
Neutrophil Count, Fluid: 8 % (ref 0–25)
Total Nucleated Cell Count, Fluid: 23 uL (ref 0–1000)
Total Nucleated Cell Count, Fluid: 27 uL (ref 0–1000)

## 2022-12-04 NOTE — Telephone Encounter (Signed)
Peer to peer has been scheduled on Tuesday 12/08/22 @ 1:00pm and since Dr. Aundria Rud will be gone. Dr. Larinda Buttery will do the peer to peer for him

## 2022-12-05 LAB — MISC LABCORP TEST (SEND OUT): Labcorp test code: 138602

## 2022-12-06 LAB — ACID FAST SMEAR (AFB, MYCOBACTERIA)
Acid Fast Smear: NEGATIVE
Acid Fast Smear: NEGATIVE

## 2022-12-07 NOTE — Anesthesia Postprocedure Evaluation (Signed)
Anesthesia Post Note  Patient: Hammad Bagley.  Procedure(s) Performed: FLEXIBLE BRONCHOSCOPY (Bilateral)  Patient location during evaluation: PACU Anesthesia Type: General Level of consciousness: awake and alert Pain management: pain level controlled Vital Signs Assessment: post-procedure vital signs reviewed and stable Respiratory status: spontaneous breathing, nonlabored ventilation, respiratory function stable and patient connected to nasal cannula oxygen Cardiovascular status: blood pressure returned to baseline and stable Postop Assessment: no apparent nausea or vomiting Anesthetic complications: no   No notable events documented.   Last Vitals:  Vitals:   12/03/22 1407 12/03/22 1528  BP: 115/80 (!) 122/93  Pulse: 93 (!) 101  Resp:  18  Temp: (!) 36.4 C   SpO2: 95% 99%    Last Pain:  Vitals:   12/03/22 1407  TempSrc: Oral  PainSc:                  Yevette Edwards

## 2022-12-08 ENCOUNTER — Emergency Department: Payer: Medicaid Other

## 2022-12-08 ENCOUNTER — Other Ambulatory Visit: Payer: Self-pay

## 2022-12-08 ENCOUNTER — Telehealth: Payer: Self-pay | Admitting: Infectious Diseases

## 2022-12-08 ENCOUNTER — Emergency Department
Admission: EM | Admit: 2022-12-08 | Discharge: 2022-12-08 | Disposition: A | Discharge status: Home / Self Care | Payer: Medicaid Other | Attending: Emergency Medicine | Admitting: Emergency Medicine

## 2022-12-08 DIAGNOSIS — E109 Type 1 diabetes mellitus without complications: Secondary | ICD-10-CM | POA: Diagnosis not present

## 2022-12-08 DIAGNOSIS — R200 Anesthesia of skin: Secondary | ICD-10-CM | POA: Insufficient documentation

## 2022-12-08 DIAGNOSIS — Z21 Asymptomatic human immunodeficiency virus [HIV] infection status: Secondary | ICD-10-CM | POA: Insufficient documentation

## 2022-12-08 DIAGNOSIS — Z794 Long term (current) use of insulin: Secondary | ICD-10-CM | POA: Insufficient documentation

## 2022-12-08 LAB — CBC WITH DIFFERENTIAL/PLATELET
Abs Immature Granulocytes: 0.01 10*3/uL (ref 0.00–0.07)
Basophils Absolute: 0 10*3/uL (ref 0.0–0.1)
Basophils Relative: 1 %
Eosinophils Absolute: 0.1 10*3/uL (ref 0.0–0.5)
Eosinophils Relative: 2 %
HCT: 33.4 % — ABNORMAL LOW (ref 39.0–52.0)
Hemoglobin: 11.1 g/dL — ABNORMAL LOW (ref 13.0–17.0)
Immature Granulocytes: 0 %
Lymphocytes Relative: 22 %
Lymphs Abs: 0.7 10*3/uL (ref 0.7–4.0)
MCH: 26.5 pg (ref 26.0–34.0)
MCHC: 33.2 g/dL (ref 30.0–36.0)
MCV: 79.7 fL — ABNORMAL LOW (ref 80.0–100.0)
Monocytes Absolute: 0.5 10*3/uL (ref 0.1–1.0)
Monocytes Relative: 17 %
Neutro Abs: 1.8 10*3/uL (ref 1.7–7.7)
Neutrophils Relative %: 58 %
Platelets: 166 10*3/uL (ref 150–400)
RBC: 4.19 MIL/uL — ABNORMAL LOW (ref 4.22–5.81)
RDW: 13.3 % (ref 11.5–15.5)
WBC: 3.1 10*3/uL — ABNORMAL LOW (ref 4.0–10.5)
nRBC: 0 % (ref 0.0–0.2)

## 2022-12-08 LAB — COMPREHENSIVE METABOLIC PANEL
ALT: 18 U/L (ref 0–44)
AST: 19 U/L (ref 15–41)
Albumin: 3.2 g/dL — ABNORMAL LOW (ref 3.5–5.0)
Alkaline Phosphatase: 59 U/L (ref 38–126)
Anion gap: 9 (ref 5–15)
BUN: 10 mg/dL (ref 6–20)
CO2: 21 mmol/L — ABNORMAL LOW (ref 22–32)
Calcium: 8.6 mg/dL — ABNORMAL LOW (ref 8.9–10.3)
Chloride: 103 mmol/L (ref 98–111)
Creatinine, Ser: 0.88 mg/dL (ref 0.61–1.24)
GFR, Estimated: >60 mL/min (ref >60)
Glucose, Bld: 250 mg/dL — ABNORMAL HIGH (ref 70–99)
Potassium: 3.7 mmol/L (ref 3.5–5.1)
Sodium: 133 mmol/L — ABNORMAL LOW (ref 135–145)
Total Bilirubin: 0.4 mg/dL (ref 0.3–1.2)
Total Protein: 7.5 g/dL (ref 6.5–8.1)

## 2022-12-08 LAB — MAGNESIUM: Magnesium: 1.9 mg/dL (ref 1.7–2.4)

## 2022-12-08 LAB — ASPERGILLUS ANTIGEN, BAL/SERUM: Aspergillus Ag, BAL/Serum: 0.05 {index} (ref 0.00–0.49)

## 2022-12-08 LAB — CK: Total CK: 74 U/L (ref 49–397)

## 2022-12-08 MED ORDER — IOHEXOL 300 MG/ML  SOLN
100.0000 mL | Freq: Once | INTRAMUSCULAR | Status: AC | PRN
  Administered 2022-12-08: 100 mL via INTRAVENOUS

## 2022-12-08 NOTE — ED Notes (Signed)
Patient transported to CT 

## 2022-12-08 NOTE — Discharge Instructions (Addendum)
You were seen in the emergency department for numbness to your left leg.  Your lab work today was normal and the ultrasound of your leg showed no abnormality.  We recommended an MRI of your brain and your lumbar spine to evaluate the cause of these symptoms which you have declined.  I recommend close follow-up with your primary care doctor who recommended she return to the emergency department at any time if you change your mind or if symptoms worsen such as worsening numbness, difficulty walking, weakness in the leg, difficulty holding your bowel or bladder, fever or any other symptom concerning to you.  Your CT scans, blood work showed no significant abnormality.  I recommend close follow-up with your primary care doctor.

## 2022-12-08 NOTE — ED Provider Notes (Addendum)
[facility] Provider Note    Event Date/Time   First MD Initiated Contact with Patient 12/08/22 0300     (approximate)   History   Extremity Weakness   HPI  Thomas Maddox. is a 22 y.o. male with recent diagnosis of HIV and PJP pneumonia, type 1 diabetes who presents to the emergency department with complaints of left leg numbness that started yesterday morning.  States that he has some pain behind the left knee intermittently but he will have numbness from about mid posterior thigh down to his foot intermittently.  He also states his leg is "jumping" intermittently.  No calf swelling.  Recently had prolonged admission for PJP pneumonia was discharged from the hospital on August 15.  Reports no further fevers, cough, chest pain or shortness of breath.  He is on Bactrim 3 times a day and Biktarvy.  He denies any other numbness or weakness.  He is able to ambulate.  No headache.  No back pain.   History provided by patient.    Past Medical History:  Diagnosis Date   ADHD (attention deficit hyperactivity disorder)    AKI (acute kidney injury) (HCC) 02/09/2016   Diabetes mellitus without complication (HCC)    DKA (diabetic ketoacidoses) 03/17/2015   Tachycardia 07/04/2019    Past Surgical History:  Procedure Laterality Date   ESOPHAGOGASTRODUODENOSCOPY (EGD) WITH PROPOFOL N/A 02/25/2021   Procedure: ESOPHAGOGASTRODUODENOSCOPY (EGD) WITH PROPOFOL;  Surgeon: Toney Reil, MD;  Location: [facility] ENDOSCOPY;  Service: Gastroenterology;  Laterality: N/A;   FLEXIBLE BRONCHOSCOPY Bilateral 12/03/2022   Procedure: FLEXIBLE BRONCHOSCOPY;  Surgeon: Raechel Chute, MD;  Location: [facility] ORS;  Service: Pulmonary;  Laterality: Bilateral;   NO PAST SURGERIES      MEDICATIONS:  Prior to Admission medications   Medication Sig Start Date End Date Taking? Authorizing Provider  albuterol (VENTOLIN HFA) 108 (90 Base) MCG/ACT inhaler Inhale 2 puffs into the lungs every  4 (four) hours as needed for wheezing or shortness of breath. 11/26/22   Irean Hong, MD  amphetamine-dextroamphetamine (ADDERALL XR) 10 MG 24 hr capsule Take 10 mg by mouth every morning.     [provider]  amphetamine-dextroamphetamine (ADDERALL XR) 30 MG 24 hr capsule Take 30 mg by mouth daily before breakfast.    [provider]  bictegravir-emtricitabine-tenofovir AF (BIKTARVY) 50-200-25 MG TABS tablet Take 1 tablet by mouth daily. 12/04/22   Lynn Ito, MD  blood glucose meter kit and supplies KIT Dispense based on patient and insurance preference. Use up to four times daily as directed. (FOR ICD-9 250.00, 250.01). 07/04/19   Zigmund Daniel., MD  cetirizine (ZYRTEC) 10 MG tablet Take 10 mg by mouth daily as needed.    [provider]  chlorpheniramine-HYDROcodone (TUSSIONEX) 10-8 MG/5ML Take 5 mLs by mouth every 12 (twelve) hours as needed for cough. 11/26/22   Irean Hong, MD  cyanocobalamin 1000 MCG tablet Take 1 tablet (1,000 mcg total) by mouth daily. 12/04/22   Loyce Dys, MD  famotidine (PEPCID) 20 MG tablet Take 1 tablet (20 mg total) by mouth 2 (two) times daily. 09/17/21   Sharman Cheek, MD  HUMALOG 100 UNIT/ML injection 100 units daily to be used in in the patients insulin pump 07/03/22   [provider]  hydrOXYzine (ATARAX) 25 MG tablet Take 1 tablet (25 mg total) by mouth 3 (three) times daily as needed for anxiety. 09/14/21   Minna Antis, MD  insulin aspart (NOVOLOG) 100 UNIT/ML  FlexPen Inject 8 Units into the skin 3 (three) times daily with meals. Patient not taking: Reported on 11/29/2022 07/04/19   Zigmund Daniel., MD  insulin degludec (TRESIBA) 100 UNIT/ML FlexTouch Pen Inject into the skin. Patient not taking: Reported on 02/25/2021 02/03/16   [provider]  Insulin Pen Needle 32G X 4 MM MISC Use with insulin 07/04/19   Zigmund Daniel., MD  naproxen (NAPROSYN) 500 MG tablet Take 1 tablet (500  mg total) by mouth 2 (two) times daily with a meal. Patient taking differently: Take 500 mg by mouth 2 (two) times daily as needed. 09/17/21   Sharman Cheek, MD  NOVOLOG MIX 70/30 FLEXPEN (70-30) 100 UNIT/ML FlexPen Inject into the skin. Patient not taking: Reported on 11/29/2022 01/24/21   [provider]  ondansetron (ZOFRAN) 4 MG tablet Take 1 tablet (4 mg total) by mouth every 8 (eight) hours as needed for nausea or vomiting. 12/03/22 12/03/23  Lynn Ito, MD  promethazine (PHENERGAN) 25 MG tablet Take 1 tablet (25 mg total) by mouth every 6 (six) hours as needed for nausea or vomiting. 07/30/21   Willy Eddy, MD  sulfamethoxazole-trimethoprim (BACTRIM DS) 800-160 MG tablet Take 2 tablets by mouth every 8 (eight) hours for 21 days. 12/03/22 12/24/22  Lynn Ito, MD    Physical Exam   Triage Vital Signs: ED Triage Vitals [12/08/22 0112]  Encounter Vitals Group     BP 127/78     Systolic BP Percentile      Diastolic BP Percentile      Pulse Rate (!) 102     Resp 18     Temp 98 F (36.7 C)     Temp src      SpO2 100 %     Weight 144 lb (65.3 kg)     Height 5\' 5"  (1.651 m)     Head Circumference      Peak Flow      Pain Score 0     Pain Loc      Pain Education      Exclude from Growth Chart     Most recent vital signs: Vitals:   12/08/22 0500 12/08/22 0530  BP: 119/80 117/78  Pulse:    Resp:    Temp:    SpO2:      CONSTITUTIONAL: Alert, responds appropriately to questions. Well-appearing; well-nourished HEAD: Normocephalic, atraumatic EYES: Conjunctivae clear, pupils appear equal, sclera nonicteric ENT: normal nose; moist mucous membranes NECK: Supple, normal ROM CARD: RRR; S1 and S2 appreciated RESP: Normal chest excursion without splinting or tachypnea; breath sounds clear and equal bilaterally; no wheezes, no rhonchi, no rales, no hypoxia or respiratory distress, speaking full sentences ABD/GI: Non-distended; soft, non-tender, no  rebound, no guarding, no peritoneal signs BACK: The back appears normal EXT: Normal ROM in all joints; no deformity noted, no edema, no calf tenderness or calf swelling, extremities warm and well-perfused SKIN: Normal color for age and race; warm; no rash on exposed skin NEURO: Moves all extremities equally, normal speech, reports diminished sensation in the posterior left popliteal fossa but otherwise normal sensation, 2+ deep tendon reflexes in bilateral lower extremities PSYCH: The patient's mood and manner are appropriate.   ED Results / Procedures / Treatments   LABS: (all labs ordered are listed, but only abnormal results are displayed) Labs Reviewed  CBC WITH DIFFERENTIAL/PLATELET - Abnormal; Notable for the following components:      Result Value   WBC 3.1 (*)  RBC 4.19 (*)    Hemoglobin 11.1 (*)    HCT 33.4 (*)    MCV 79.7 (*)    All other components within normal limits  COMPREHENSIVE METABOLIC PANEL - Abnormal; Notable for the following components:   Sodium 133 (*)    CO2 21 (*)    Glucose, Bld 250 (*)    Calcium 8.6 (*)    Albumin 3.2 (*)    All other components within normal limits  MAGNESIUM  CK     EKG:   RADIOLOGY: My personal review and interpretation of imaging: Ultrasound shows no DVT.  CT scan show no acute abnormality.  I have personally reviewed all radiology reports.   CT LUMBAR SPINE W WO CONTRAST  Result Date: 12/08/2022 CLINICAL DATA:  22 year old male recently hospitalized for pneumonia. New left leg numbness. Query CNS infection/meningitis. EXAM: CT LUMBAR SPINE WITH AND WITHOUT CONTRAST TECHNIQUE: Multidetector CT imaging of the lumbar spine was performed without and with intravenous contrast administration. Multiplanar CT image reconstructions were also generated. RADIATION DOSE REDUCTION: This exam was performed according to the departmental dose-optimization program which includes automated exposure control, adjustment of the mA and/or kV  according to patient size and/or use of iterative reconstruction technique. CONTRAST:  OMNIPAQUE IOHEXOL 300 MG/ML  SOLN COMPARISON:  CTA chest 10/21/2020 and CT Abdomen and Pelvis 10/06/2020. FINDINGS: Segmentation: Normal. Alignment: Lumbar lordosis appears stable since 2022, normal. There is minimal levoconvex lumbar spine curvature. No spondylolisthesis. Vertebrae: No osseous abnormality identified. Paraspinal and other soft tissues: Lung bases not included. Mild abdominal and pelvic viscera motion artifact on both the precontrast and postcontrast images. Faint calcified proximal iliac artery atherosclerosis. Following contrast the visible major vascular structures are enhancing and appear to be patent. No lymphadenopathy identified. Negative visible abdominal viscera. Lumbar paraspinal soft tissues are within normal limits. Disc levels: No abnormal intradural density or enhancement by CT. Lumbar spine epidural spaces appear to be normal. T12-L1:  Negative. L1-L2:  Negative. L2-L3:  Negative. L3-L4:  Negative. L4-L5:  Negative. L5-S1: Possible small central disc protrusion here, not apparent in 2022 and perhaps slightly eccentric to the right (series 5, image 84). No significant spinal stenosis. No foraminal stenosis. IMPRESSION: 1. Possible small right paracentral disc herniation at L5-S1. No significant spinal or foraminal stenosis but might be a source of S1 radiculitis. 2. Otherwise negative CT appearance of the Lumbar Spine without and with contrast. 3. Minimal calcified iliac artery atherosclerosis. Electronically Signed   By: Odessa Fleming M.D.   On: 12/08/2022 06:59   CT Head W or Wo Contrast  Result Date: 12/08/2022 CLINICAL DATA:  22 year old male recently hospitalized for pneumonia. New left leg numbness. Query CNS infection/meningitis. EXAM: CT HEAD WITHOUT AND WITH CONTRAST TECHNIQUE: Contiguous axial images were obtained from the base of the skull through the vertex without and with intravenous  contrast. RADIATION DOSE REDUCTION: This exam was performed according to the departmental dose-optimization program which includes automated exposure control, adjustment of the mA and/or kV according to patient size and/or use of iterative reconstruction technique. CONTRAST:  OMNIPAQUE IOHEXOL 300 MG/ML  SOLN COMPARISON:  Brain MRI and intracranial MRA 09/17/2021. Temporal bone CT 10/21/2020. FINDINGS: Brain: Cerebral volume is within normal limits. No midline shift, ventriculomegaly, mass effect, evidence of mass lesion, intracranial hemorrhage or evidence of cortically based acute infarction. Gray-white matter differentiation is within normal limits throughout the brain. No abnormal enhancement identified. Vascular: Major intracranial vascular structures are enhancing as expected. Skull: Negative. Sinuses/Orbits: Visualized paranasal  sinuses, tympanic cavities, and mastoids are stable and well aerated. Other: Visualized orbits and scalp soft tissues are within normal limits. IMPRESSION: Normal CT appearance of the brain, head without and with contrast Electronically Signed   By: Odessa Fleming M.D.   On: 12/08/2022 06:53   US Venous Img Lower Unilateral Left  Result Date: 12/08/2022 CLINICAL DATA:  22 year old male with history of left leg pain. EXAM: LEFT LOWER EXTREMITY VENOUS DOPPLER ULTRASOUND TECHNIQUE: Gray-scale sonography with compression, as well as color and duplex ultrasound, were performed to evaluate the deep venous system(s) from the level of the common femoral vein through the popliteal and proximal calf veins. COMPARISON:  None Available. FINDINGS: VENOUS Normal compressibility of the common femoral, superficial femoral, and popliteal veins, as well as the visualized calf veins. Visualized portions of profunda femoral vein and great saphenous vein unremarkable. No filling defects to suggest DVT on grayscale or color Doppler imaging. Doppler waveforms show normal direction of venous flow, normal  respiratory plasticity and response to augmentation. Limited views of the contralateral common femoral vein are unremarkable. OTHER None. Limitations: none IMPRESSION: 1. No evidence of left lower extremity deep venous thrombosis. . Electronically Signed   By: Trudie Reed M.D.   On: 12/08/2022 05:11     PROCEDURES:  Critical Care performed: No     Procedures    IMPRESSION / MDM / ASSESSMENT AND PLAN / ED COURSE  I reviewed the triage vital signs and the nursing notes.    Patient here with left leg numbness and discomfort after recent admission and diagnosis of HIV and PJP pneumonia.     DIFFERENTIAL DIAGNOSIS (includes but not limited to):   Sciatica, epidural abscess, discitis, osteomyelitis, DVT, rhabdomyolysis, electrolyte derangement, intracranial infection, stroke   Patient's presentation is most consistent with acute presentation with potential threat to life or bodily function.   PLAN: Will obtain MRI of the head and lumbar spine with and without contrast given patient is severely immunocompromised with CD4 count of 22 and active PJP pneumonia on Bactrim.  Will also obtain Doppler to rule out DVT given pain in the popliteal fossa and recent hospitalization.  Will obtain labs to evaluate for electrolyte derangement.   MEDICATIONS GIVEN IN ED: Medications  iohexol (OMNIPAQUE) 300 MG/ML solution 100 mL (100 mLs Intravenous Contrast Given 12/08/22 7829)     ED COURSE: Patient now states that he does not want to proceed with MRI.  He states he is claustrophobic and was "unable to even get through 5 minutes" of a previous MRI.  Have offered him sedation medications but he still declines.  States he is comfortable with proceeding with ultrasound of the leg.  We discussed that we cannot rule out infection of the spine or brain sufficiently without further imaging and that I do not feel a CT would be as helpful.  He verbalized understanding.   5:30 AM  Pt's labs  unremarkable.  Normal electrolytes, no significant change in his leukopenia, normal CK.  Ultrasound reviewed and interpreted by myself and the radiologist and shows no DVT.  No sign of cellulitis on exam, compartment syndrome and patient has a strong DP pulse.  Have again recommended MRI for further evaluation especially given he is significantly immunocompromised with a CD4 count of 22 and PJP pneumonia.  Patient states he does not think he could lay still for an MRI despite me offering to sedate him.  He states he would like to go home and see his PCP and will  return if symptoms worsen.  He will sign out AGAINST MEDICAL ADVICE.  Patient appears to have capacity to make decisions for himself, verbalizes understanding of risks presented to him including but not limited to worsening symptoms, disability and death.  Will provide with discharge paperwork.  He does have a PCP for follow-up.  5:50 AM  After further discussion with patient, he agrees to stay for CT scans of the head and lumbar spine with contrast.  We discussed that this imaging would not be as detailed as an MRI to officially rule out everything that we need to rule out today but would be a starting point.   7:05 AM Pt's CT scans reviewed and interpreted by myself and the radiologist.  Patient has a right paracentral disc herniation but I do not think this is causing his left-sided symptoms.  Otherwise unremarkable without sign of infection present.  Again discussed with patient that the gold standard would really be for MRI which she still declines.  Will discharge home with follow-up with his PCP.  Patient comfortable with this plan.  CONSULTS: Further workup recommended but patient has decided to leave AGAINST MEDICAL ADVICE without completing care.   OUTSIDE RECORDS REVIEWED: Reviewed recent admission notes.       FINAL CLINICAL IMPRESSION(S) / ED DIAGNOSES   Final diagnoses:  Numbness of left lower extremity     Rx / DC  Orders   ED Discharge Orders     None        Note:  This document was prepared using Dragon voice recognition software and may include unintentional dictation errors.       Cecillia Menees, Layla Maw, DO 12/08/22 0538    Izekiel Flegel, Layla Maw, DO 12/08/22 [phone]

## 2022-12-08 NOTE — Telephone Encounter (Signed)
Dr. Janann Colonel did peer to peer today for Dr. Aundria Rud and they overturned their denial decision. Auth # OP [id number] valid 12/03/22 to 03/03/2023

## 2022-12-08 NOTE — Telephone Encounter (Signed)
Called patient to check how he was doing with Bactrim DS 2 TID for PJP treatment- HE had gone to [facility] ED yesterday 8/19 with numness left leg- CMP N K and cr, CBC stable low WBC, platlelt has normalized- they did Ct lumbar spine and thought he could have pinched nerve HE also has low B12 diagnosed recently and is on Po b12 HE will continue Bactrim, he will start Biktarvy tomorrow- HE has ana ppt with me on 12/29/22. He will call if he has any concerns with his meds

## 2022-12-08 NOTE — Telephone Encounter (Signed)
Great. Noted. 

## 2022-12-08 NOTE — ED Notes (Addendum)
Blue top sent on pt

## 2022-12-08 NOTE — ED Triage Notes (Signed)
Pt states he was admitted here for pneumonia and discharged this past Thursday. Pt states he was sent home with prescription for abx, and yesterday started having left leg numbness. Pt ambulatory to triage. Denies any urinary issues.

## 2022-12-10 LAB — MISC LABCORP TEST (SEND OUT): Labcorp test code: 6054

## 2022-12-11 ENCOUNTER — Emergency Department: Payer: Medicaid Other

## 2022-12-11 ENCOUNTER — Other Ambulatory Visit: Payer: Self-pay

## 2022-12-11 ENCOUNTER — Emergency Department
Admission: EM | Admit: 2022-12-11 | Discharge: 2022-12-12 | Disposition: A | Discharge status: Home / Self Care | Payer: Medicaid Other | Attending: Emergency Medicine | Admitting: Emergency Medicine

## 2022-12-11 DIAGNOSIS — R079 Chest pain, unspecified: Secondary | ICD-10-CM | POA: Diagnosis present

## 2022-12-11 DIAGNOSIS — Z20822 Contact with and (suspected) exposure to covid-19: Secondary | ICD-10-CM | POA: Diagnosis not present

## 2022-12-11 DIAGNOSIS — R0602 Shortness of breath: Secondary | ICD-10-CM | POA: Insufficient documentation

## 2022-12-11 DIAGNOSIS — R630 Anorexia: Secondary | ICD-10-CM | POA: Diagnosis not present

## 2022-12-11 DIAGNOSIS — R5383 Other fatigue: Secondary | ICD-10-CM | POA: Diagnosis not present

## 2022-12-11 DIAGNOSIS — R112 Nausea with vomiting, unspecified: Secondary | ICD-10-CM | POA: Diagnosis not present

## 2022-12-11 DIAGNOSIS — R509 Fever, unspecified: Secondary | ICD-10-CM | POA: Diagnosis not present

## 2022-12-11 LAB — RESP PANEL BY RT-PCR (RSV, FLU A&B, COVID)  RVPGX2
Influenza A by PCR: NEGATIVE
Influenza B by PCR: NEGATIVE
Resp Syncytial Virus by PCR: NEGATIVE
SARS Coronavirus 2 by RT PCR: NEGATIVE

## 2022-12-11 LAB — URINALYSIS, ROUTINE W REFLEX MICROSCOPIC
Bacteria, UA: NONE SEEN
Bilirubin Urine: NEGATIVE
Glucose, UA: NEGATIVE mg/dL
Hgb urine dipstick: NEGATIVE
Ketones, ur: 20 mg/dL — AB
Leukocytes,Ua: NEGATIVE
Nitrite: NEGATIVE
Protein, ur: 100 mg/dL — AB
Specific Gravity, Urine: 1.019 (ref 1.005–1.030)
pH: 5 (ref 5.0–8.0)

## 2022-12-11 LAB — BASIC METABOLIC PANEL
Anion gap: 9 (ref 5–15)
BUN: 10 mg/dL (ref 6–20)
CO2: 22 mmol/L (ref 22–32)
Calcium: 8.2 mg/dL — ABNORMAL LOW (ref 8.9–10.3)
Chloride: 97 mmol/L — ABNORMAL LOW (ref 98–111)
Creatinine, Ser: 1.12 mg/dL (ref 0.61–1.24)
GFR, Estimated: >60 mL/min (ref >60)
Glucose, Bld: 159 mg/dL — ABNORMAL HIGH (ref 70–99)
Potassium: 4 mmol/L (ref 3.5–5.1)
Sodium: 128 mmol/L — ABNORMAL LOW (ref 135–145)

## 2022-12-11 LAB — CBC
HCT: 35.1 % — ABNORMAL LOW (ref 39.0–52.0)
Hemoglobin: 11.8 g/dL — ABNORMAL LOW (ref 13.0–17.0)
MCH: 26.9 pg (ref 26.0–34.0)
MCHC: 33.6 g/dL (ref 30.0–36.0)
MCV: 80 fL (ref 80.0–100.0)
Platelets: 101 10*3/uL — ABNORMAL LOW (ref 150–400)
RBC: 4.39 MIL/uL (ref 4.22–5.81)
RDW: 13.9 % (ref 11.5–15.5)
WBC: 3 10*3/uL — ABNORMAL LOW (ref 4.0–10.5)
nRBC: 0 % (ref 0.0–0.2)

## 2022-12-11 LAB — FUNGITELL BETA-D-GLUCAN
Fungitell Value:: >500 pg/mL
Result Name:: POSITIVE — AB

## 2022-12-11 LAB — TROPONIN I (HIGH SENSITIVITY)
Troponin I (High Sensitivity): 6 ng/L (ref <18)
Troponin I (High Sensitivity): 5 ng/L (ref <18)

## 2022-12-11 LAB — LACTIC ACID, PLASMA
Lactic Acid, Venous: 1.2 mmol/L (ref 0.5–1.9)
Lactic Acid, Venous: 1 mmol/L (ref 0.5–1.9)

## 2022-12-11 LAB — CK: Total CK: 44 U/L — ABNORMAL LOW (ref 49–397)

## 2022-12-11 MED ORDER — ONDANSETRON HCL 4 MG/2ML IJ SOLN
4.0000 mg | Freq: Once | INTRAMUSCULAR | Status: AC
  Administered 2022-12-11: 4 mg via INTRAVENOUS
  Filled 2022-12-11: qty 2

## 2022-12-11 MED ORDER — ONDANSETRON HCL 8 MG PO TABS: 8.0000 mg | ORAL_TABLET | Freq: Three times a day (TID) | ORAL | 0 refills | Status: AC | PRN

## 2022-12-11 MED ORDER — SULFAMETHOXAZOLE-TRIMETHOPRIM 800-160 MG PO TABS
2.0000 | ORAL_TABLET | Freq: Once | ORAL | Status: AC
  Administered 2022-12-11: 2 via ORAL
  Filled 2022-12-11: qty 2

## 2022-12-11 MED ORDER — SODIUM CHLORIDE 0.9 % IV BOLUS
1000.0000 mL | Freq: Once | INTRAVENOUS | Status: AC
  Administered 2022-12-11: 1000 mL via INTRAVENOUS

## 2022-12-11 MED ORDER — IOHEXOL 350 MG/ML SOLN
75.0000 mL | Freq: Once | INTRAVENOUS | Status: AC | PRN
  Administered 2022-12-11: 75 mL via INTRAVENOUS

## 2022-12-11 NOTE — ED Notes (Signed)
Patient transported to CT 

## 2022-12-11 NOTE — ED Provider Notes (Signed)
[facility] Provider Note   Event Date/Time   First MD Initiated Contact with Patient 12/11/22 2004     (approximate) History  Chest Pain  HPI Thomas Maddox. is a 22 y.o. male with a past medical history of type 1 diabetes who presents complaining of chest pain, shortness of breath, and fever as well as decreased appetite, lethargy, and dark-colored urine after being admitted for pneumonia recently.  Patient states that this was months ago but states that his symptoms have persisted since being diagnosed.  Patient denies any recent travel or sick contacts.  Patient does endorse worsening symptoms with exertion. ROS: Patient currently denies any vision changes, tinnitus, difficulty speaking, facial droop, sore throat, abdominal pain, nausea/vomiting/diarrhea, dysuria, or weakness/numbness/paresthesias in any extremity   Physical Exam  Triage Vital Signs: ED Triage Vitals  Encounter Vitals Group     BP 12/11/22 1953 118/76     Systolic BP Percentile --      Diastolic BP Percentile --      Pulse Rate 12/11/22 1953 (!) 123     Resp 12/11/22 1953 18     Temp 12/11/22 1953 (!) 101.3 F (38.5 C)     Temp src --      SpO2 12/11/22 1953 100 %     Weight 12/11/22 1950 140 lb (63.5 kg)     Height 12/11/22 1950 5\' 6"  (1.676 m)     Head Circumference --      Peak Flow --      Pain Score 12/11/22 1953 0     Pain Loc --      Pain Education --      Exclude from Growth Chart --    Most recent vital signs: Vitals:   12/11/22 2200 12/12/22 0000  BP: 124/83 126/88  Pulse: (!) 110 (!) 107  Resp: (!) 21 (!) 24  Temp:  (!) 100.4 F (38 C)  SpO2: 100% 99%   General: Awake, oriented x4. CV:  Good peripheral perfusion.  Resp:  Normal effort.  Abd:  No distention.  Other:  Young adult well-developed, well-nourished Hispanic male resting comfortably in no acute distress ED Results / Procedures / Treatments  Labs (all labs ordered are listed, but only abnormal  results are displayed) Labs Reviewed  CULTURE, BLOOD (ROUTINE X 2) - Abnormal; Notable for the following components:      Result Value   Culture   (*)    Value: STAPHYLOCOCCUS EPIDERMIDIS THE SIGNIFICANCE OF ISOLATING THIS ORGANISM FROM A SINGLE SET OF BLOOD CULTURES WHEN MULTIPLE SETS ARE DRAWN IS UNCERTAIN. PLEASE NOTIFY THE MICROBIOLOGY DEPARTMENT WITHIN ONE WEEK IF SPECIATION AND SENSITIVITIES ARE REQUIRED. Performed at [facility], [address], [place], [place] [address]    All other components within normal limits  BLOOD CULTURE ID PANEL (REFLEXED) - BCID2 - Abnormal; Notable for the following components:   Staphylococcus species DETECTED (*)    Staphylococcus epidermidis DETECTED (*)    Methicillin resistance mecA/C DETECTED (*)    All other components within normal limits  BASIC METABOLIC PANEL - Abnormal; Notable for the following components:   Sodium 128 (*)    Chloride 97 (*)    Glucose, Bld 159 (*)    Calcium 8.2 (*)    All other components within normal limits  CBC - Abnormal; Notable for the following components:   WBC 3.0 (*)    Hemoglobin 11.8 (*)    HCT 35.1 (*)  Platelets 101 (*)    All other components within normal limits  CK - Abnormal; Notable for the following components:   Total CK 44 (*)    All other components within normal limits  URINALYSIS, ROUTINE W REFLEX MICROSCOPIC - Abnormal; Notable for the following components:   Color, Urine YELLOW (*)    APPearance CLEAR (*)    Ketones, ur 20 (*)    Protein, ur 100 (*)    All other components within normal limits  CULTURE, BLOOD (ROUTINE X 2)  RESP PANEL BY RT-PCR (RSV, FLU A&B, COVID)  RVPGX2  LACTIC ACID, PLASMA  LACTIC ACID, PLASMA  TROPONIN I (HIGH SENSITIVITY)  TROPONIN I (HIGH SENSITIVITY)   EKG ED ECG REPORT I, Merwyn Katos, the attending physician, personally viewed and interpreted this ECG. Date: 12/11/2022 EKG Time: 1953 Rate: 121 Rhythm: Tachycardic sinus rhythm QRS Axis:  normal Intervals: normal ST/T Wave abnormalities: normal Narrative Interpretation: Tachycardic sinus rhythm no evidence of acute ischemia RADIOLOGY ED MD interpretation: Portable chest x-ray interpreted by me shows no evidence of acute abnormalities including no pneumonia, pneumothorax, or widened mediastinum -Agree with radiology assessment Official radiology report(s): No results found. PROCEDURES: Critical Care performed: No Procedures MEDICATIONS ORDERED IN ED: Medications  iohexol (OMNIPAQUE) 350 MG/ML injection 75 mL (75 mLs Intravenous Contrast Given 12/11/22 2226)  sulfamethoxazole-trimethoprim (BACTRIM DS) 800-160 MG per tablet 2 tablet (2 tablets Oral Given 12/11/22 2304)  ondansetron (ZOFRAN) injection 4 mg (4 mg Intravenous Given 12/11/22 2305)  sodium chloride 0.9 % bolus 1,000 mL (0 mLs Intravenous Stopped 12/12/22 0028)   IMPRESSION / MDM / ASSESSMENT AND PLAN / ED COURSE  I reviewed the triage vital signs and the nursing notes.                             The patient is on the cardiac monitor to evaluate for evidence of arrhythmia and/or significant heart rate changes. Patient's presentation is most consistent with acute presentation with potential threat to life or bodily function. Workup: ECG, CXR, CBC, BMP, Troponin Findings: ECG: No overt evidence of STEMI. No evidence of Brugada's sign, delta wave, epsilon wave, significantly prolonged QTc, or malignant arrhythmia HS Troponin: Negative x1 Other Labs unremarkable for emergent problems. CXR: Without PTX, PNA, or widened mediastinum Last Stress Test: Never Last Heart Catheterization: Never HEART Score: 2  Given History, Exam, and Workup I have low suspicion for ACS, Pneumothorax, Pneumonia, Pulmonary Embolus, Tamponade, Aortic Dissection or other emergent problem as a cause for this presentation.   Disposition: Care of this patient will be signed out to the oncoming physician at the end of my shift.  All pertinent  patient information conveyed and all questions answered.  All further care and disposition decisions will be made by the oncoming physician.     FINAL CLINICAL IMPRESSION(S) / ED DIAGNOSES   Final diagnoses:  Chest pain, unspecified type  Nausea and vomiting, unspecified vomiting type   Rx / DC Orders   ED Discharge Orders     None      Note:  This document was prepared using Dragon voice recognition software and may include unintentional dictation errors.   Merwyn Katos, MD 12/15/22 [phone]

## 2022-12-11 NOTE — ED Triage Notes (Signed)
Pt presents ambulatory to triage via POV with complaints of CP following a recent admission for PNA. Pt endorses decreased appetite, lethargy, and dark colored urine. Pt was seen by his PCP this week who advised that he come to the ED If sx persisted but no interventions were done PTA. A&Ox4 at this time. Denies SOB.

## 2022-12-11 NOTE — Telephone Encounter (Signed)
Instructions given to the patient.  See below

## 2022-12-11 NOTE — Telephone Encounter (Signed)
Patient called complaining  of having nausea, and no appetite, and reports chest pain that feels like GERD. He reports chest pain started yesterday. He denies SOB.  I have sent the patient instructions per [name] to help with symptoms. Patient also advised over the phone ED if chest pain worsens.  Per Dr. Rivka Safer patient can also hold the Bactrim if symptoms dont improved until after Bactrim is completed.  Thomas Maddox T Pricilla Loveless

## 2022-12-12 ENCOUNTER — Telehealth: Payer: Self-pay | Admitting: Emergency Medicine

## 2022-12-12 NOTE — Telephone Encounter (Signed)
This RN called and spoke with patient, confirmed with patient's DOB prior to giving information. This RN received call from lab regarding + blood culture with staph epidermitis. Called patient to check on him per Dr. Anner Crete. Per patient he is currently admitted to ICU at [facility] for DKA. Dr. Anner Crete notified of patient condition.

## 2022-12-13 LAB — BLOOD CULTURE ID PANEL (REFLEXED) - BCID2

## 2022-12-14 LAB — CULTURE, BLOOD (ROUTINE X 2)

## 2022-12-14 LAB — FUNGITELL BETA-D-GLUCAN

## 2022-12-15 ENCOUNTER — Telehealth: Payer: Self-pay | Admitting: Infectious Diseases

## 2022-12-15 NOTE — Telephone Encounter (Signed)
Called patient to find out how he was doing with bactrim and biktarvy- HE had chest pain and came to [facility] ED and then went to [facility] on 8/24 and was waiting for 5 hrs and developed DKA and was hospitalized-He had a rash on 8/25 thought to be from Bactrim and it was stopped and he was placed on mepron. He is discharged from [facility] and at home. He is taking mepron and biktarvy- will follow up with me on 9/10

## 2022-12-16 LAB — CULTURE, BLOOD (ROUTINE X 2): Culture: NO GROWTH

## 2022-12-19 LAB — CULTURE, RESPIRATORY W GRAM STAIN
Culture: NO GROWTH
Culture: NO GROWTH

## 2022-12-28 ENCOUNTER — Emergency Department
Admission: EM | Admit: 2022-12-28 | Discharge: 2022-12-28 | Disposition: A | Discharge status: Home / Self Care | Payer: Medicaid Other | Attending: Student in an Organized Health Care Education/Training Program | Admitting: Student in an Organized Health Care Education/Training Program

## 2022-12-28 ENCOUNTER — Emergency Department: Payer: Medicaid Other

## 2022-12-28 ENCOUNTER — Other Ambulatory Visit: Payer: Self-pay

## 2022-12-28 ENCOUNTER — Encounter: Payer: Self-pay | Admitting: Radiology

## 2022-12-28 DIAGNOSIS — Z9641 Presence of insulin pump (external) (internal): Secondary | ICD-10-CM | POA: Insufficient documentation

## 2022-12-28 DIAGNOSIS — Z21 Asymptomatic human immunodeficiency virus [HIV] infection status: Secondary | ICD-10-CM | POA: Insufficient documentation

## 2022-12-28 DIAGNOSIS — R1084 Generalized abdominal pain: Secondary | ICD-10-CM | POA: Insufficient documentation

## 2022-12-28 DIAGNOSIS — I880 Nonspecific mesenteric lymphadenitis: Secondary | ICD-10-CM

## 2022-12-28 DIAGNOSIS — R109 Unspecified abdominal pain: Secondary | ICD-10-CM | POA: Diagnosis present

## 2022-12-28 DIAGNOSIS — E1065 Type 1 diabetes mellitus with hyperglycemia: Secondary | ICD-10-CM | POA: Insufficient documentation

## 2022-12-28 LAB — CBC
HCT: 33.8 % — ABNORMAL LOW (ref 39.0–52.0)
Hemoglobin: 10.9 g/dL — ABNORMAL LOW (ref 13.0–17.0)
MCH: 27.5 pg (ref 26.0–34.0)
MCHC: 32.2 g/dL (ref 30.0–36.0)
MCV: 85.1 fL (ref 80.0–100.0)
Platelets: 177 10*3/uL (ref 150–400)
RBC: 3.97 MIL/uL — ABNORMAL LOW (ref 4.22–5.81)
RDW: 15.4 % (ref 11.5–15.5)
WBC: 6.4 10*3/uL (ref 4.0–10.5)
nRBC: 0 % (ref 0.0–0.2)

## 2022-12-28 LAB — COMPREHENSIVE METABOLIC PANEL
ALT: 30 U/L (ref 0–44)
AST: 22 U/L (ref 15–41)
Albumin: 3.3 g/dL — ABNORMAL LOW (ref 3.5–5.0)
Alkaline Phosphatase: 98 U/L (ref 38–126)
Anion gap: 9 (ref 5–15)
BUN: 11 mg/dL (ref 6–20)
CO2: 26 mmol/L (ref 22–32)
Calcium: 8.6 mg/dL — ABNORMAL LOW (ref 8.9–10.3)
Chloride: 100 mmol/L (ref 98–111)
Creatinine, Ser: 0.77 mg/dL (ref 0.61–1.24)
GFR, Estimated: >60 mL/min (ref >60)
Glucose, Bld: 249 mg/dL — ABNORMAL HIGH (ref 70–99)
Potassium: 3.7 mmol/L (ref 3.5–5.1)
Sodium: 135 mmol/L (ref 135–145)
Total Bilirubin: 0.6 mg/dL (ref 0.3–1.2)
Total Protein: 7.8 g/dL (ref 6.5–8.1)

## 2022-12-28 LAB — URINALYSIS, ROUTINE W REFLEX MICROSCOPIC
Bilirubin Urine: NEGATIVE
Glucose, UA: 50 mg/dL — AB
Hgb urine dipstick: NEGATIVE
Ketones, ur: 5 mg/dL — AB
Leukocytes,Ua: NEGATIVE
Nitrite: NEGATIVE
Protein, ur: 30 mg/dL — AB
Specific Gravity, Urine: 1.02 (ref 1.005–1.030)
pH: 6 (ref 5.0–8.0)

## 2022-12-28 LAB — LIPASE, BLOOD: Lipase: 19 U/L (ref 11–51)

## 2022-12-28 LAB — CBG MONITORING, ED: Glucose-Capillary: 106 mg/dL — ABNORMAL HIGH (ref 70–99)

## 2022-12-28 MED ORDER — NAPROXEN 500 MG PO TBEC: 500.0000 mg | DELAYED_RELEASE_TABLET | Freq: Two times a day (BID) | ORAL | 0 refills | Status: AC

## 2022-12-28 MED ORDER — KETOROLAC TROMETHAMINE 15 MG/ML IJ SOLN
15.0000 mg | Freq: Once | INTRAMUSCULAR | Status: AC
  Administered 2022-12-28: 15 mg via INTRAVENOUS
  Filled 2022-12-28: qty 1

## 2022-12-28 MED ORDER — IOHEXOL 300 MG/ML  SOLN
100.0000 mL | Freq: Once | INTRAMUSCULAR | Status: AC | PRN
  Administered 2022-12-28: 100 mL via INTRAVENOUS

## 2022-12-28 MED ORDER — SODIUM CHLORIDE 0.9 % IV BOLUS
1000.0000 mL | Freq: Once | INTRAVENOUS | Status: AC
  Administered 2022-12-28: 1000 mL via INTRAVENOUS

## 2022-12-28 NOTE — Discharge Instructions (Signed)
You are seen in the emergency department for abdominal pain.  On CT scan did not find any surgical reasons for your abdominal pain.  You did have lymph nodes that were enlarged that can be seen with mesenteric adenitis which is a viral infection.  Do not feel that you need to be started on antibiotics today.  You can alternate Motrin and Tylenol for pain control.  It is important that you call your primary care physician and your infectious disease doctor in the morning to schedule close follow-up appointment.  If you are unable to be seen, your symptoms continue or worsen it is important that you return to the emergency department for reevaluation.  Continue to take your medications as prescribed.  You had an incidental finding on your CT scan -both of your hips have some flattening of your femoral heads (top of your leg bones) that can been seen when you do not get enough blood supply to your bones.  Talk to your primary care physician about this finding and whether you need further workup with orthopedics.  Pain control:  Ibuprofen (motrin/aleve/advil) - You can take 3 tablets (600 mg) every 6 hours as needed for pain/fever.  Acetaminophen (tylenol) - You can take 2 extra strength tablets (1000 mg) every 6 hours as needed for pain/fever.  You can alternate these medications or take them together.  Make sure you eat food/drink water when taking these medications.

## 2022-12-28 NOTE — ED Provider Notes (Signed)
[facility] Provider Note    Event Date/Time   First MD Initiated Contact with Patient 12/28/22 1626     (approximate)   History   Abdominal Pain   HPI  Thomas Maddox. is a 22 y.o. male presents to the ER for evaluation of right flank and right-sided abdominal pain.  Patient with type 1 diabetes and HIV.  No fevers has had some nausea.  Went to [facility] and was told that maybe is gallbladder but also having flank pain.  Denies any dysuria.     Physical Exam   Triage Vital Signs: ED Triage Vitals  Encounter Vitals Group     BP 12/28/22 1505 126/84     Systolic BP Percentile --      Diastolic BP Percentile --      Pulse Rate 12/28/22 1505 (!) 111     Resp 12/28/22 1505 19     Temp 12/28/22 1505 99 F (37.2 C)     Temp Source 12/28/22 1505 Oral     SpO2 12/28/22 1505 100 %     Weight 12/28/22 1506 152 lb (68.9 kg)     Height 12/28/22 1506 5\' 6"  (1.676 m)     Head Circumference --      Peak Flow --      Pain Score 12/28/22 1506 10     Pain Loc --      Pain Education --      Exclude from Growth Chart --     Most recent vital signs: Vitals:   12/28/22 1505  BP: 126/84  Pulse: (!) 111  Resp: 19  Temp: 99 F (37.2 C)  SpO2: 100%     Constitutional: Alert  Eyes: Conjunctivae are normal.  Head: Atraumatic. Nose: No congestion/rhinnorhea. Mouth/Throat: Mucous membranes are moist.   Neck: Painless ROM.  Cardiovascular:   Good peripheral circulation. Respiratory: Normal respiratory effort.  No retractions.  Gastrointestinal: Soft and nontender.  Musculoskeletal:  no deformity Neurologic:  MAE spontaneously. No gross focal neurologic deficits are appreciated.  Skin:  Skin is warm, dry and intact. No rash noted. Psychiatric: Mood and affect are normal. Speech and behavior are normal.    ED Results / Procedures / Treatments   Labs (all labs ordered are listed, but only abnormal results are displayed) Labs Reviewed   COMPREHENSIVE METABOLIC PANEL - Abnormal; Notable for the following components:      Result Value   Glucose, Bld 249 (*)    Calcium 8.6 (*)    Albumin 3.3 (*)    All other components within normal limits  CBC - Abnormal; Notable for the following components:   RBC 3.97 (*)    Hemoglobin 10.9 (*)    HCT 33.8 (*)    All other components within normal limits  URINALYSIS, ROUTINE W REFLEX MICROSCOPIC - Abnormal; Notable for the following components:   Color, Urine YELLOW (*)    APPearance CLEAR (*)    Glucose, UA 50 (*)    Ketones, ur 5 (*)    Protein, ur 30 (*)    Bacteria, UA RARE (*)    All other components within normal limits  CBG MONITORING, ED - Abnormal; Notable for the following components:   Glucose-Capillary 106 (*)    All other components within normal limits  LIPASE, BLOOD     EKG     RADIOLOGY Please see ED Course for my review and interpretation.  I personally reviewed all radiographic images ordered to  evaluate for the above acute complaints and reviewed radiology reports and findings.  These findings were personally discussed with the patient.  Please see medical record for radiology report.    PROCEDURES:  Critical Care performed: No  Procedures   MEDICATIONS ORDERED IN ED: Medications  sodium chloride 0.9 % bolus 1,000 mL (0 mLs Intravenous Stopped 12/28/22 1853)  iohexol (OMNIPAQUE) 300 MG/ML solution 100 mL (100 mLs Intravenous Contrast Given 12/28/22 1728)     IMPRESSION / MDM / ASSESSMENT AND PLAN / ED COURSE  I reviewed the triage vital signs and the nursing notes.                              Differential diagnosis includes, but is not limited to, cholelithiasis, appendicitis, stone, UTI, electrolyte abnormality, DKA, dehydration  Patient presenting to the ER for evaluation of symptoms as described above.  Based on symptoms, risk factors and considered above differential, this presenting complaint could reflect a potentially  life-threatening illness therefore the patient will be placed on continuous pulse oximetry and telemetry for monitoring.  Laboratory evaluation will be sent to evaluate for the above complaints.  Patient with mild hyperglycemia but normal gap no DKA.  Will give IV fluids.  He is on insulin pump and is just dosed himself additional corrective coverage.  Will order CT imaging to further evaluate above complaints.   Clinical Course as of 12/28/22 1916  Mon Dec 28, 2022  1751 The imaging my review and interpretation does not show any evidence of obstruction.  Gallbladder looks normal.  Not seeing any findings to suggest acute appendicitis. [PR]  1912 Still pending imaging results.  Patient will be signed out to oncoming physician. [PR]    Clinical Course User Index [PR] Willy Eddy, MD     FINAL CLINICAL IMPRESSION(S) / ED DIAGNOSES   Final diagnoses:  Generalized abdominal pain     Rx / DC Orders   ED Discharge Orders          Ordered    POCT glucose        12/28/22 1914             Note:  This document was prepared using Dragon voice recognition software and may include unintentional dictation errors.    Willy Eddy, MD 12/28/22 [phone]

## 2022-12-28 NOTE — ED Triage Notes (Signed)
Pt sts that he has been having right sided abd pain. Pt sts that his pcp advised him to come in due to the on going problem.

## 2022-12-28 NOTE — ED Provider Notes (Signed)
Care assumed of patient from outgoing provider.  See their note for initial history, exam and plan.  Clinical Course as of 12/28/22 Darrick Grinder Dec 28, 2022  1751 The imaging my review and interpretation does not show any evidence of obstruction.  Gallbladder looks normal.  Not seeing any findings to suggest acute appendicitis. [PR]  1912 Still pending imaging results.  Patient will be signed out to oncoming physician. [PR]  1919 History of DM and HIV, no DKA, hyperglycemia.  RLQ abd pain. CT pending. Plan to dc if negative CT.  [SM]    Clinical Course User Index [PR] Willy Eddy, MD [SM] Corena Herter, MD   CT scan with findings concerning for possible mesenteric adenitis.  Also incidental noting of flattening of his femoral heads concerning for developmental dysplasia versus avascular necrosis.  Patient with no pain in his pelvis or hips.  Discussed at length close follow-up with his infectious disease providers and with his primary care physician.  Discussed incidental finding with the patient and the need for further outpatient workup.  Given return precautions for any ongoing or worsening symptoms.   Corena Herter, MD 12/28/22 2141

## 2022-12-29 ENCOUNTER — Inpatient Hospital Stay: Payer: Medicaid Other | Admitting: Infectious Diseases

## 2023-01-04 LAB — FUNGUS CULTURE RESULT

## 2023-01-04 LAB — FUNGUS CULTURE WITH STAIN

## 2023-01-04 LAB — FUNGAL ORGANISM REFLEX

## 2023-01-07 ENCOUNTER — Inpatient Hospital Stay: Payer: Medicaid Other | Admitting: Infectious Diseases

## 2023-01-07 NOTE — Discharge Summary (Signed)
 ------------------------------------------------------------------------------- Attestation signed by Thereasa Gauze, MD at 01/07/23 2111 Discharge Attestation: I saw and evaluated the patient, participating in the key portions of the service on the day of discharge.  I reviewed the resident's note and agree with the discharge plans and disposition. I personally spent 32 minutes in discharge planning services.    Gauze Thereasa, MD   -------------------------------------------------------------------------------   Physician Discharge Summary HBR 2 BT2 [facility] [address] [place] [address] Dept: [phone] Loc: [phone]   Identifying Information:  Thomas Maddox [birth year] [id number]  Primary Care Physician: Lorel Byars Achirimofor, MD  Code Status: Full Code  Admit Date: 01/01/2023  Discharge Date: 01/07/2023   Discharge To: Home  Discharge Service: HBR 42 - Blue   Discharge Attending Physician: Elias Fox, MD  Discharge Diagnoses: Principal Problem:   Mesenteric lymphadenopathy Active Problems:   Diabetes mellitus type 1 (CMS-HCC)   Bilious vomiting with nausea   Esophageal reflux   History of Pneumocystis jirovecii pneumonia   AIDS (acquired immunodeficiency syndrome), CD4 <=200 (CMS-HCC)   Nausea   Sinus tachycardia   Outpatient Provider Follow Up Issues:  [ ]  Fu with infectious disease for treatment and biopsy fu [ ]  Fu with Endocrinology to follow-up T1DM   Hospital Course:  Thomas Alicea. is a 22 y.o. male with a past medical history significant for HIV, TIDM, gastroparesis, GERD, recent PJP pneumonia who presented for abdominal pain, nausea, vomiting and found to have extensive lymphadenopathy concerning for MAC vs Immune Reconstitution Inflammatory Syndrome (IRIS) vs malignancy (Kaposi Sarcoma, Diffuse Large B cell).  Mesenteric Lymphadenopathy/Hepatomegaly Presented with abdominal pain, fevers, chills, nausea, non-bloody,  non-bilious vomiting for 2 days and CT findings (09/13) of extensive mesenteric and retroperitoneal adenopathy, increasing hepatomegaly, new periportal edema when compared to prior CT (09/09). In the setting of recent advanced HIV diagnosis (11/2022) with PJP pneumonia. ID was consulted in the ED. Last seen in ID clinic 09/12 with concern for MAC-related IRIS secondary to HIV treatment vs underlying malignancy. Labs notable for lack of leukocytosis, improved LFTs. CT chest (09/16) without mediastinal, hilar, supraclavicular, or axillary LAD, small bilateral pleural effusions with bibasilar atelectasis. Extensive ID work-up overall unrevealing. Treated empirically for MAC with azithromycin  500 mg daily and ethambutol  ~1 g daily and was discharged on this regimen. S/p uncomplicated EUS with lymph node biopsy with GI on 9/19 for further evaluation. Held off on steroids until more concrete diagnosis as risk if underlying malignancy. Has close follow-up with ID 01/14/2023 to continue work-up and treatment of this.  Abdominal and Back Pain  Nausea  Vomiting, improved Pain improved with multimodal pain regimen of scheduled tylenol  and ibuprofen , lidocaine  patches, prn flexeril and prn oxycodone  2.5mg . Patient had one BM 09/17, on bowel regimen. Able to tolerate PO, consuming ~ 1 meal per day, supplementing with Glucerna. Discharge with short courses of flexeril 10 mg TID prn and oxycodone  2.5 mg prn q4h.   Advanced HIV, H/o PJP Pneumonia s/p treatment Continued Biktarvy  and atovaquone prophylaxis inpatient. HIV RNA < 20 on 09/14. CD4 Count 104 on 09/16.  T1DM On insulin  pump at home which was removed in ED. HbA1c 9.4% on 12/13/22. Per endocrinology note, patient contingency plan for 32 units Lantus . Variable control of BGL during admission given patient concern for hypoglycemia. However, after shared decision making with the patient, opted for treatment with 24 units of Lantus  and 9 units of Lispro with meals.  Continue variable BG but overall improving. Patient preferred to be discharged with  plan to resume insulin  pump at home, feels very comfortable with using insulin  pump and has all the supplies he needs. Discussed importance of following-up with Endocrinologist if any concern. Also has novolog  70-30 as back-up if his pump fails and feels very comfortable using this as well.   Sinus Tachycardia, resolved Initially presented with sinus tachycardia which improved with fluid resuscitation and pain control.   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 During current admission had resolution of thrombocytopenia with normalization on discharge.     Touchbase with Outpatient Provider: Warm Handoff: Completed on 01/07/23 by Glennon CHRISTELLA Finer, MD  (Resident) via [facility] Message  Procedures: - EUS Lymph Node Biopsy 09/19 with [facility]  No admission procedures for hospital encounter. ______________________________________________________________________ Discharge Medications:   Your Medication List     STOP taking these medications    naproxen  500 MG tablet Commonly known as: NAPROSYN        START taking these medications    acetaminophen  500 MG tablet Commonly known as: TYLENOL  Take 2 tablets (1,000 mg total) by mouth every eight (8) hours.   azithromycin  500 MG tablet Commonly known as: ZITHROMAX  Take 1 tablet (500 mg total) by mouth daily. Start taking on: January 08, 2023   cyclobenzaprine 10 MG tablet Commonly known as: FLEXERIL Take 1 tablet (10 mg total) by mouth Three (3) times a day.   ethambutol  100 MG tablet Commonly known as: MYAMBUTOL  Take 10 tablets (1,000 mg total) by mouth daily. Start taking on: January 08, 2023   ibuprofen  600 MG tablet Commonly known as: MOTRIN  Take 1 tablet (600 mg total) by mouth every eight (8) hours.   lidocaine  5 % patch Commonly known as: LIDODERM  Place 1 patch on the skin  every twelve (12) hours. Remove & Discard patch within 12 hours or as directed by MD   oxyCODONE  5 MG immediate release tablet Commonly known as: ROXICODONE  Take 0.5 tablets (2.5 mg total) by mouth every four (4) hours as needed for pain for up to 5 days.       CONTINUE taking these medications    acetone (urine) test strip Generic drug: acetone (urine) test measure urine ketones if blood sugar is >250. Call endocrinologist for moderate/ large ketones   dextroamphetamine -amphetamine  30 MG 24 hr capsule Commonly known as: ADDERALL XR Take 1 capsule (30 mg total) by mouth every morning.   ADDERALL XR 10 mg 24 hr capsule Generic drug: dextroamphetamine -amphetamine  Take 1 capsule (10 mg total) by mouth every morning.   atovaquone 750 mg/5 mL suspension Commonly known as: MEPRON Take 10 mL (1,500 mg total) by mouth daily.   BIKTARVY  50-200-25 mg tablet Generic drug: bictegrav-emtricit-tenofov ala Take 1 tablet by mouth daily.   blood sugar diagnostic Strp Dispense 400 blood glucose test strips, ok to sub any brand preferred by insurance/patient to match with meter, use 4x/day, dx E10.65   blood-glucose meter kit by Other route once for 1 dose. Use as instructed. Dispense brand covered by insurance. E10.65   cetirizine 10 MG tablet Commonly known as: ZYRTEC Take 1 tablet (10 mg total) by mouth daily.   DEXCOM G6 TRANSMITTER Devi Generic drug: blood-glucose transmitter 1 each by Miscellaneous route Every three (3) months.   DEXCOM G7 RECEIVER Misc Generic drug: blood-glucose meter,continuous Use as directed   DEXCOM G7 SENSOR Devi Generic drug: blood-glucose sensor Dispense DexCom G7 sensors; Use 1 sensor q 10 days. E10.65   DEXCOM G6 SENSOR Espiridion  Generic drug: blood-glucose sensor Use 1 each by Miscellaneous route every ten (10) days.   famotidine  20 MG tablet Commonly known as: PEPCID  Take 1 tablet (20 mg total) by mouth in the morning.   glucagon spray 3  mg/actuation Spry Use 1 spray in 1 nostril for severe hypoglycemia, as per package instructions   HumaLOG U-100 Insulin  100 unit/mL injection Generic drug: insulin  lispro Inject 100 units daily to be used in in the patients insulin  pump   insulin  aspart protamine-insulin  aspart 100 unit/mL (70-30) injection pen Commonly known as: NovoLOG  70/30 Inject 0.28 mL (28 Units total) under the skin Three (3) times a day before meals.   lancets Misc Dispense 400 lancets, ok to sub any brand preferred by insurance/patient to match lancing device, use 4x/day, dx E10.65   omeprazole  40 MG capsule Commonly known as: PriLOSEC Take 1 capsule (40 mg total) by mouth two (2) times a day.   OMNIPOD 5 G6 INTRO KIT (GEN 5) Crtg Generic drug: insulin  pump cart,auto,BT-cntr Started kit includes controller and 11 pods.  Change pods every 3 days or as instructed by your health care professional.   OMNIPOD 5 G6 PODS (GEN 5) Crtg Generic drug: insulin  pump cart,automated,BT Pods to be changed every 3 days or as instructed by your health care professional.   ondansetron  8 MG tablet Commonly known as: ZOFRAN  Take 1 tablet (8 mg total) by mouth every eight (8) hours as needed for nausea.   triamcinolone 0.1 % cream Commonly known as: KENALOG Apply to affected area(s) topically two (2) times a day.   ULTICARE PEN NEEDLE 31 gauge x 3/16 (5 mm) Ndle Generic drug: pen needle, diabetic   pen needle, diabetic 32 gauge x 5/32 (4 mm) Ndle QID to administer insulin  HS and at meals daily        Allergies: Bactrim  [sulfamethoxazole -trimethoprim ] ______________________________________________________________________ Pending Test Results (if blank, then none): Pending Labs     Order Current Status   Histoplasma Antibody In process   Surgical pathology exam In process   AFB Culture, Blood Preliminary result       Most Recent Labs: All lab results last 24 hours -  Recent Results (from the past 24  hour(s))  POCT Glucose   Collection Time: 01/06/23  9:13 PM  Result Value Ref Range   Glucose, POC 322 (H) 70 - 179 mg/dL  POCT Glucose   Collection Time: 01/07/23 12:20 AM  Result Value Ref Range   Glucose, POC 252 (H) 70 - 179 mg/dL  Comprehensive Metabolic Panel   Collection Time: 01/07/23  5:50 AM  Result Value Ref Range   Sodium 140 135 - 145 mmol/L   Potassium 4.0 3.4 - 4.8 mmol/L   Chloride 105 98 - 107 mmol/L   CO2 28.9 20.0 - 31.0 mmol/L   Anion Gap 6 5 - 14 mmol/L   BUN 11 9 - 23 mg/dL   Creatinine 9.32 (L) 9.26 - 1.18 mg/dL   BUN/Creatinine Ratio 16    eGFR CKD-EPI (2021) Male >90 >=60 mL/min/1.41m2   Glucose 228 (H) 70 - 179 mg/dL   Calcium 8.9 8.7 - 89.5 mg/dL   Albumin 2.6 (L) 3.4 - 5.0 g/dL   Total Protein 6.9 5.7 - 8.2 g/dL   Total Bilirubin 0.2 (L) 0.3 - 1.2 mg/dL   AST 17 <=65 U/L   ALT 22 10 - 49 U/L   Alkaline Phosphatase 109 46 - 116 U/L  CBC w/ Differential   Collection Time: 01/07/23  5:50  AM  Result Value Ref Range   WBC 4.3 3.6 - 11.2 10*9/L   RBC 3.90 (L) 4.26 - 5.60 10*12/L   HGB 10.5 (L) 12.9 - 16.5 g/dL   HCT 68.3 (L) 60.9 - 51.9 %   MCV 80.9 77.6 - 95.7 fL   MCH 26.9 25.9 - 32.4 pg   MCHC 33.2 32.0 - 36.0 g/dL   RDW 83.5 (H) 87.7 - 84.7 %   MPV 9.0 6.8 - 10.7 fL   Platelet 321 150 - 450 10*9/L   nRBC 0 <=4 /100 WBCs   Neutrophils % 47.7 %   Lymphocytes % 31.5 %   Monocytes % 16.6 %   Eosinophils % 3.4 %   Basophils % 0.8 %   Absolute Neutrophils 2.0 1.8 - 7.8 10*9/L   Absolute Lymphocytes 1.3 1.1 - 3.6 10*9/L   Absolute Monocytes 0.7 0.3 - 0.8 10*9/L   Absolute Eosinophils 0.1 0.0 - 0.5 10*9/L   Absolute Basophils 0.0 0.0 - 0.1 10*9/L  Morphology Review   Collection Time: 01/07/23  5:50 AM  Result Value Ref Range   Smear Review Comments See Comment (A) Undefined  POCT Glucose   Collection Time: 01/07/23  6:07 AM  Result Value Ref Range   Glucose, POC 237 (H) 70 - 179 mg/dL  POCT Glucose   Collection Time: 01/07/23 11:32 AM   Result Value Ref Range   Glucose, POC 185 (H) 70 - 179 mg/dL  POCT Glucose   Collection Time: 01/07/23  2:13 PM  Result Value Ref Range   Glucose, POC 134 70 - 179 mg/dL  POCT Glucose   Collection Time: 01/07/23  4:07 PM  Result Value Ref Range   Glucose, POC 134 70 - 179 mg/dL  POCT Glucose   Collection Time: 01/07/23  5:28 PM  Result Value Ref Range   Glucose, POC 250 (H) 70 - 179 mg/dL    Relevant Studies/Radiology (if blank, then none): Upper Endoscopic Ultrasound (EUS)  Result Date: 01/07/2023 _______________________________________________________________________________ Patient Name: Thomas Maddox          Procedure Date: 01/07/2023 12:34 PM MRN: [id number]                     Date of Birth: Sep 17, 2000 Admit Type: Inpatient                 Age: 1 Room: [facility] 01 [facility]          Gender: Male Note Status: Finalized                Instrument Name: [id number] _______________________________________________________________________________  Procedure:             Upper EUS Indications:           Abnormal abdominal/pelvic CT scan Providers:             BURNARD ALMARIE BUDD, MD, ANDREW WILHELMENIA SHOULDERS,                        DO (Fellow), COREAN ROULEAU, RN, [facility]res Referring MD:          MAXIE TANA BUCKLES, MD (Referring MD) Medicines:             General Anesthesia Complications:         No immediate complications. _______________________________________________________________________________ Procedure:             Pre-Anesthesia Assessment:                        -  Prior to the procedure, a History and Physical was                        performed, and patient medications and allergies were                        reviewed. The patient's tolerance of previous                        anesthesia was also reviewed. The risks and benefits                        of the procedure and the sedation options and risks                        were discussed with the  patient. All questions were                        answered, and informed consent was obtained. Prior                        Anticoagulants: The patient has taken no anticoagulant                        or antiplatelet agents. ASA Grade Assessment: II - A                        patient with mild systemic disease. After reviewing                        the risks and benefits, the patient was deemed in                        satisfactory condition to undergo the procedure.                        After obtaining informed consent, the endoscope was                        passed under direct vision. Throughout the procedure,                        the patient's blood pressure, pulse, and oxygen                        saturations were monitored continuously. The                        Duodenoscope was introduced through the mouth, and                        advanced to the duodenal bulb. The upper EUS was                        accomplished without difficulty. The patient tolerated                        the procedure well.  Findings:      ENDOSONOGRAPHIC FINDING: :      There was no sign of significant endosonographic abnormality in the left      lobe of the liver.      There was no sign of significant endosonographic abnormality in the      pancreatic head, pancreatic body and pancreatic tail. The pancreas was      well visualized.      There was no sign of significant endosonographic abnormality in the      gallbladder.      Two abnormal lymph nodes were visualized in the periduodenal region. The      largest measured 19 mm by 11 mm in maximal cross-sectional diameter. The      nodes were oval, hypoechoic and had well defined margins. Fine needle      biopsy was performed. Color Doppler imaging was utilized prior to needle      puncture to confirm a lack of significant vascular structures within the      needle path. Three passes were made  with the 22 gauge Acquire biopsy      needle using a transduodenal approach. Visible cores of tissue were      obtained. Final results are pending.      Many additional abnormal lymph nodes were visualized in the perigastric      region, peripancreatic region, porta hepatis region and upper-abdominal      periaortic region. The nodes were hypoechoic.                                                                                Estimated Blood Loss:      Estimated blood loss: none. Impression:            - Many abnormal lymph nodes were visualized in the                        perigastric region, peripancreatic region, porta                        hepatis region and upper-abdominal periaortic region.                        Two of the largest lesions in the periduodenal region                        were biopsied today. Recommendation:        - Return patient to referring hospital for ongoing                        care.                        - Resume previous diet.                        - Continue present medications.                        -  Await path results.                                                                                Procedure Code(s):     --- Professional ---                        [phone], Esophagogastroduodenoscopy, flexible,                        transoral; with transendoscopic ultrasound-guided                        intramural or transmural fine needle                        aspiration/biopsy(s), (includes endoscopic ultrasound                        examination limited to the esophagus, stomach or                        duodenum, and adjacent structures) Diagnosis Code(s):     --- Professional ---                        I89.9, Noninfective disorder of lymphatic vessels and                        lymph nodes, unspecified                        R93.5, Abnormal findings on diagnostic imaging of                        other abdominal regions, including retroperitoneum CPT copyright 2023  American Medical Association. All rights reserved. The codes documented in this report are preliminary and upon coder review may be revised to meet current compliance requirements. Attending Participation:      I was present and participated during the entire procedure, including      non-key portions.                                                                                ___________________________ BURNARD ALMARIE BUDD, MD 01/07/2023 3:12:59 PM Electronically signed by Prentice Shoulders, DO ____________________________ PRENTICE WILHELMENIA SHOULDERS, DO 01/07/2023 3:03:12 PM Number of Addenda: 0 Note Initiated On: 01/07/2023 12:34 PM  CT Chest W Contrast  Result Date: 01/05/2023 EXAM: CT CHEST W CONTRAST ACCESSION: [id number] UN CLINICAL INDICATION: Eval for lymphadenopathy for potential biopsy TECHNIQUE: Contiguous axial images were reconstructed through the chest following a single breath hold helical acquisition during the administration of intravenous contrast material. Images were reformatted in the axial, coronal, and sagittal  planes. MIP slabs were also constructed. COMPARISON: 01/01/2023 chest radiographs FINDINGS: LUNGS, AIRWAYS, AND PLEURA: Segmental/subsegmental atelectasis with platelike atelectasis in bibasilar lower lobes and right middle lobe. Lungs are otherwise clear. Central airways are patent. Small bilateral pleural effusions. No pneumothorax. MEDIASTINUM AND LYMPH NODES: No mediastinal or hilar lymphadenopathy. No mediastinal mass or other abnormality. HEART AND VASCULATURE: Cardiac chambers are normal in size. No pericardial effusion. Aorta is normal in caliber. Main pulmonary artery is normal in size. CHEST WALL AND BONES: No suspicious lytic or sclerotic osseous lesions. Thoracic spine appears normal. Chest wall appears normal. UPPER ABDOMEN: Visualized upper abdomen appears normal. OTHER: Thyroid  appears normal. No supraclavicular or axillary lymphadenopathy.   1.  No mediastinal,  hilar, supraclavicular or axillary lymphadenopathy. 2.  Small bilateral pleural effusions with bibasilar atelectasis..   XR Chest 2 views  Result Date: 01/01/2023 EXAM: XR CHEST 2 VIEWS ACCESSION: [id number] UN CLINICAL INDICATION: HIV recent PJP ; OTHER  TECHNIQUE: PA and Lateral Chest Radiographs. COMPARISON: CT abdomen pelvis 01/01/2023 and chest radiograph 08/15/2004 FINDINGS: Lungs are clear.  No pleural effusion or pneumothorax. Normal heart size and mediastinal contours.   Clear lungs. No evidence of pneumonia or pulmonary edema.  ECG 12 Lead  Result Date: 01/01/2023 SINUS TACHYCARDIA RIGHTWARD AXIS BORDERLINE ECG WHEN COMPARED WITH ECG OF 12-Dec-2022 10:40, NO SIGNIFICANT CHANGE WAS FOUND Confirmed by Von Shawl [phone]) on 01/01/2023 12:08:20 PM  CT Abdomen Pelvis W IV Contrast  Result Date: 01/01/2023 EXAM: CT ABDOMEN PELVIS W CONTRAST ACCESSION: [id number] UN CLINICAL INDICATION: 22 years old with diffuse abdominal pain, leukocytosis  COMPARISON: CT abdomen pelvis report from 12/28/2022 from [facility], CT abdomen pelvis 12/12/2022 TECHNIQUE: A helical CT scan of the abdomen and pelvis was obtained following IV contrast from the lung bases through the pubic symphysis. Images were reconstructed in the axial plane. Coronal and sagittal reformatted images were also provided for further evaluation. FINDINGS: LOWER CHEST: Basilar atelectasis. LIVER: Normal liver contour.  No focal liver lesions. Hepatomegaly measuring up to 23.6 cm in the greatest axial dimension, increased in size compared to prior. Mild periportal edema. BILIARY: The gallbladder is normal in appearance. No biliary ductal dilatation.  SPLEEN: Normal in size and contour. PANCREAS: Normal pancreatic contour.  No focal lesions.  No ductal dilation. ADRENAL GLANDS: Normal appearance of the adrenal glands. KIDNEYS/URETERS: Nonspecific heterogeneous enhancement of the kidneys, particularly in the upper poles. No hydronephrosis.  No  solid renal mass. BLADDER: Mild circumferential wall thickening, nonspecific. REPRODUCTIVE ORGANS: Unremarkable. GI TRACT: Mild circumferential distal esophageal wall thickening. No findings of bowel obstruction or acute inflammation.  Normal appendix. PERITONEUM, RETROPERITONEUM AND MESENTERY: Extensive mesenteric adenopathy with surrounding mesenteric stranding. No free air. Trace free fluid surrounding loops of bowel in the mesentery. Small volume pelvic free fluid. LYMPH NODES: Extensive mesenteric adenopathy with lymph nodes measuring up to 1.4 cm previously measuring up to 1.3 cm according to report from CT abdomen pelvis from 12/28/2022. Retroperitoneal lymphadenopathy with para-aortic lymph node conglomerate measuring up to 1.6 cm. VESSELS: Hepatic and portal veins are patent.  Normal caliber aorta.  BONES and SOFT TISSUES: No aggressive osseous lesions.  No focal soft tissue lesions.   1.  Extensive mesenteric and retroperitoneal adenopathy with mesenteric edema. Differential, considerations could include mesenteric adenitis, lymphoma, infection or immune reconstitution inflammatory syndrome in the setting of HIV. 2.  Increasing hepatomegaly. 3.  New mild periportal edema and small volume pelvic ascites, indicating mild volume overload. 4.  Mild circumferential bladder wall thickening with mildly heterogeneous enhancement of  the upper poles of the kidney. Correlate with urinalysis to exclude urinary tract infection. 5.  Additional chronic and incidental findings as detailed in the body of the report. ==================== MODIFIED REPORT: (01/01/2023 8:16 AM) This report has been modified from its preliminary version; you may check the prior versions of radiology report, results history link for prior report versions (if they were previously visible in Epic). -----------------------------------------------   ______________________________________________________________________ Discharge Instructions:     Other Instructions     Discharge instructions     You were admitted to [facility] at [facility] for abdominal pain, nausea, vomiting and found to have inflamed lymph nodes in your abdomen.   We have consulted with Infectious Disease and Gastroenterology to evaluate for the causes of this inflammation. We prophylactically treated you for MAC infection and evaluated for other causes of this inflammation with your GI lymph node biopsy. We have continued your antiretroviral therapy and have seen improvement in CD4 counts and a low viral load. Please follow up with Infectious Disease to determine duration of treatment for MAC if biopsy returns positive.   Your pain has been managed in the hospital with Tylenol , Ibuprofen , Lidocaine  patches, Flexeril and Oxycodone . We encourage you to rely on the over the counter medications for pain management outside of the hospital.   Your diabetes has been managed with 24 units of Lantus  and 9 units of Lispro at mealtime. Please follow up with your Endocrinologist for evaluation of your insulin  regimen after having your insulin  pump removed in the ED.   Please carefully read and follow these instructions below upon your discharge:  1) Please take your medications as prescribed and note the changes listed on your discharge. At future follow-up appointments, please be sure to take all of your medications with you so your provider can better guide your care.   2) Seek medical care with your primary care doctor or local Emergency Room or Urgent Care if you develop any changes in your mental status, worsening abdominal pain, fevers greater than 101.5, any unexplained/unrelieved shortness of breath, uncontrolled nausea and vomiting that keeps you from remaining hydrated or taking your medication, or any other concerning symptoms.   3) Please go to your follow-up appointments. Some of your follow-up appointments have been listed below. If you do not  see an appointment listed below with your primary care doctor, please call your doctor's office as soon as possible to schedule an appointment to be seen within 7-10 days of discharge.   4) If you have any concerns before you are able to follow-up with your primary care doctor, you can reach us  by calling [phone] and asking to page the [facility] resident on call.          Follow Up instructions and Outpatient Referrals    Discharge instructions       Appointments which have been scheduled for you    Jan 14, 2023 9:30 AM (Arrive by 9:15 AM) Return Specialty Id with Corazon I Halsey, FNP [facility] INFECTIOUS DISEASES [place] [facility] REGION) [address] [facility] 1 through [address] [place] [address] [phone]     Feb 23, 2023 1:20 PM (Arrive by 1:05 PM) RETURN  DIABETES with Saint Carter Stallion, MD [facility] DIABETES AND ENDOCRINOLOGY [place] [facility] REGION) [address] [facility] 1 through 4 [place] [address] [phone]        ______________________________________________________________________ Discharge Day Services: BP 131/88   Pulse 89  Temp 36.8 C (98.2 F) (Oral)   Resp 18   Ht 167.6 cm (5' 6)   Wt 68.3 kg (150 lb 9.2 oz)   SpO2 98%   BMI 24.30 kg/m  Pt seen on the day of discharge and determined appropriate for discharge.  GEN: well appearing, sitting in chair, NAD HEENT: NCAT, MMM. EOMI. Neck: Supple. CV: Regular rate and rhythm. No murmurs/rubs/gallops. Pulm: CTAB. No wheezing, crackles, or rhonchi. Abd: Flat.  Very mildly tender to midline abdomen. No guarding, rebound.  Normoactive bowel sounds.   MSK: Mild tenderness to lower back, improved from prior Neuro: A&O x 3. No focal deficits.  Ext: No peripheral edema.  Palpable distal pulses.  Condition at Discharge: good  Length of Discharge: I spent greater than 30 mins in the discharge of this patient.

## 2023-01-15 LAB — MISC LABCORP TEST (SEND OUT): Labcorp test code: 183764

## 2023-01-15 NOTE — Telephone Encounter (Addendum)
 Called pt to follow up on hyperglycemia he was experiencing. Left vm that I will try reaching him again this morning.     Called pt second time this morning. Left a second vm that I was again trying to reach him. I explained that my office has him on the wait list to move up his CDE appt. I asked him to call us today if sugars are still high. If he feels unwell or cannot lower his sugars, he needs to go to the ED. I sent him a mychart msg as well in case he is able to read that first.

## 2023-01-18 LAB — ACID FAST CULTURE WITH REFLEXED SENSITIVITIES (MYCOBACTERIA)
Acid Fast Culture: NEGATIVE
Acid Fast Culture: NEGATIVE

## 2023-01-20 ENCOUNTER — Telehealth: Payer: Self-pay

## 2023-01-20 NOTE — Telephone Encounter (Signed)
-----   Message from Lynn Ito sent at 01/19/2023  7:30 PM EDT ----- It looks like patient is getting HIV care at [facility]. Can you please check with him and if so we can docment that and close his chart. thx ----- Message ----- From: Leory Plowman, [facility] [place] Sent: 12/03/2022   6:07 PM EDT To: Lynn Ito, MD

## 2023-01-20 NOTE — Telephone Encounter (Signed)
Patient has established care with [facility] Kennis Buell Jonathon Resides, CMA

## 2023-02-04 ENCOUNTER — Inpatient Hospital Stay: Payer: Medicaid Other | Admitting: Infectious Diseases

## 2023-04-03 ENCOUNTER — Other Ambulatory Visit: Payer: Self-pay

## 2023-04-03 ENCOUNTER — Emergency Department
Admission: EM | Admit: 2023-04-03 | Discharge: 2023-04-03 | Disposition: A | Discharge status: Home / Self Care | Payer: Medicaid Other | Attending: Emergency Medicine | Admitting: Emergency Medicine

## 2023-04-03 DIAGNOSIS — R739 Hyperglycemia, unspecified: Secondary | ICD-10-CM | POA: Insufficient documentation

## 2023-04-03 DIAGNOSIS — R55 Syncope and collapse: Secondary | ICD-10-CM | POA: Diagnosis not present

## 2023-04-03 DIAGNOSIS — R42 Dizziness and giddiness: Secondary | ICD-10-CM | POA: Insufficient documentation

## 2023-04-03 LAB — COMPREHENSIVE METABOLIC PANEL
ALT: 38 U/L (ref 0–44)
AST: 24 U/L (ref 15–41)
Albumin: 4.1 g/dL (ref 3.5–5.0)
Alkaline Phosphatase: 83 U/L (ref 38–126)
Anion gap: 11 (ref 5–15)
BUN: 15 mg/dL (ref 6–20)
CO2: 21 mmol/L — ABNORMAL LOW (ref 22–32)
Calcium: 9.2 mg/dL (ref 8.9–10.3)
Chloride: 104 mmol/L (ref 98–111)
Creatinine, Ser: 1.07 mg/dL (ref 0.61–1.24)
GFR, Estimated: >60 mL/min (ref >60)
Glucose, Bld: 397 mg/dL — ABNORMAL HIGH (ref 70–99)
Potassium: 4.1 mmol/L (ref 3.5–5.1)
Sodium: 136 mmol/L (ref 135–145)
Total Bilirubin: 0.7 mg/dL (ref <1.2)
Total Protein: 7.8 g/dL (ref 6.5–8.1)

## 2023-04-03 LAB — CBC WITH DIFFERENTIAL/PLATELET
Abs Immature Granulocytes: 0.01 10*3/uL (ref 0.00–0.07)
Basophils Absolute: 0 10*3/uL (ref 0.0–0.1)
Basophils Relative: 1 %
Eosinophils Absolute: 0.1 10*3/uL (ref 0.0–0.5)
Eosinophils Relative: 2 %
HCT: 44 % (ref 39.0–52.0)
Hemoglobin: 15.4 g/dL (ref 13.0–17.0)
Immature Granulocytes: 0 %
Lymphocytes Relative: 32 %
Lymphs Abs: 1.5 10*3/uL (ref 0.7–4.0)
MCH: 27.8 pg (ref 26.0–34.0)
MCHC: 35 g/dL (ref 30.0–36.0)
MCV: 79.4 fL — ABNORMAL LOW (ref 80.0–100.0)
Monocytes Absolute: 0.6 10*3/uL (ref 0.1–1.0)
Monocytes Relative: 14 %
Neutro Abs: 2.3 10*3/uL (ref 1.7–7.7)
Neutrophils Relative %: 51 %
Platelets: 198 10*3/uL (ref 150–400)
RBC: 5.54 MIL/uL (ref 4.22–5.81)
RDW: 13.6 % (ref 11.5–15.5)
WBC: 4.6 10*3/uL (ref 4.0–10.5)
nRBC: 0 % (ref 0.0–0.2)

## 2023-04-03 LAB — URINALYSIS, ROUTINE W REFLEX MICROSCOPIC
Bacteria, UA: NONE SEEN
Bilirubin Urine: NEGATIVE
Glucose, UA: >=500 mg/dL — AB
Hgb urine dipstick: NEGATIVE
Ketones, ur: 20 mg/dL — AB
Leukocytes,Ua: NEGATIVE
Nitrite: NEGATIVE
Protein, ur: NEGATIVE mg/dL
Specific Gravity, Urine: 1.025 (ref 1.005–1.030)
Squamous Epithelial / HPF: 0 /[HPF] (ref 0–5)
pH: 6 (ref 5.0–8.0)

## 2023-04-03 LAB — CBG MONITORING, ED: Glucose-Capillary: 334 mg/dL — ABNORMAL HIGH (ref 70–99)

## 2023-04-03 LAB — BLOOD GAS, VENOUS
Acid-Base Excess: 0.8 mmol/L (ref 0.0–2.0)
Bicarbonate: 25.3 mmol/L (ref 20.0–28.0)
O2 Saturation: 80.2 %
Patient temperature: 37
pCO2, Ven: 39 mm[Hg] — ABNORMAL LOW (ref 44–60)
pH, Ven: 7.42 (ref 7.25–7.43)
pO2, Ven: 46 mm[Hg] — ABNORMAL HIGH (ref 32–45)

## 2023-04-03 LAB — BETA-HYDROXYBUTYRIC ACID: Beta-Hydroxybutyric Acid: 0.68 mmol/L — ABNORMAL HIGH (ref 0.05–0.27)

## 2023-04-03 MED ORDER — LACTATED RINGERS IV BOLUS
1000.0000 mL | Freq: Once | INTRAVENOUS | Status: AC
  Administered 2023-04-03: 1000 mL via INTRAVENOUS

## 2023-04-03 NOTE — ED Triage Notes (Signed)
Pt from home via [facility] for hyperglycemia. Pt was diagnosed with RSV about one week ago and has had unstable CBGs since. Fire reported a CBG of 414. EMS reported 365. Pt has an insulin pump.   Vitals for EMS CBG: 365 T:  99.0 oral BP: 148/90  18G LAC

## 2023-04-03 NOTE — ED Provider Notes (Signed)
[facility] Provider Note    Event Date/Time   First MD Initiated Contact with Patient 04/03/23 [phone]     (approximate)   History   Hyperglycemia   HPI  Thomas Maddox. is a 22 y.o. male who presents to the ED for evaluation of Hyperglycemia   I review ID visit from 4 days ago.  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 Type I diabetic  Patient presents to the ED for evaluation of hyperglycemia.  Reports he was feeling little bit presyncopal and dizzy in the setting of a warm shower.  No falls or syncope.  Checked his CGM and it was reading in the 400s.  Reports that is been quite labile since he was recently diagnosed with RSV.  Reports continued congestion but no fevers or other worsening symptoms.  No abdominal pain, no emesis.  No chest pain.   Physical Exam   Triage Vital Signs: ED Triage Vitals  Encounter Vitals Group     BP      Systolic BP Percentile      Diastolic BP Percentile      Pulse      Resp      Temp      Temp src      SpO2      Weight      Height      Head Circumference      Peak Flow      Pain Score      Pain Loc      Pain Education      Exclude from Growth Chart     Most recent vital signs: Vitals:   04/03/23 0300 04/03/23 0330  BP: 112/74 111/84  Pulse: 93 90  Resp: 20 20  Temp:    SpO2: 100% 100%    General: Awake, no distress.  CV:  Good peripheral perfusion.  Resp:  Normal effort.  Abd:  No distention.  MSK:  No deformity noted.  Neuro:  No focal deficits appreciated. Other:     ED Results / Procedures / Treatments   Labs (all labs ordered are listed, but only abnormal results are displayed) Labs Reviewed  BETA-HYDROXYBUTYRIC ACID - Abnormal; Notable for the following components:      Result Value   Beta-Hydroxybutyric Acid 0.68 (*)    All other components within normal limits  BLOOD GAS, VENOUS - Abnormal; Notable for the following  components:   pCO2, Ven 39 (*)    pO2, Ven 46 (*)    All other components within normal limits  CBC WITH DIFFERENTIAL/PLATELET - Abnormal; Notable for the following components:   MCV 79.4 (*)    All other components within normal limits  COMPREHENSIVE METABOLIC PANEL - Abnormal; Notable for the following components:   CO2 21 (*)    Glucose, Bld 397 (*)    All other components within normal limits  URINALYSIS, ROUTINE W REFLEX MICROSCOPIC - Abnormal; Notable for the following components:   Color, Urine STRAW (*)    APPearance CLEAR (*)    Glucose, UA >=500 (*)    Ketones, ur 20 (*)    All other components within normal limits  CBG MONITORING, ED - Abnormal; Notable for the following components:   Glucose-Capillary 334 (*)    All other components within normal limits    EKG   RADIOLOGY   Official radiology report(s): No results found.  PROCEDURES and INTERVENTIONS:  .1-3  Lead EKG Interpretation  Performed by: Delton Prairie, MD Authorized by: Delton Prairie, MD     Interpretation: normal     ECG rate:  88   ECG rate assessment: normal     Rhythm: sinus rhythm     Conduction: normal     Medications  lactated ringers bolus 1,000 mL (0 mLs Intravenous Stopped 04/03/23 0236)     IMPRESSION / MDM / ASSESSMENT AND PLAN / ED COURSE  I reviewed the triage vital signs and the nursing notes.  Differential diagnosis includes, but is not limited to, medication noncompliance, DKA, symptomatic hypoglycemia, symptomatic anemia, AKI  {Patient presents with symptoms of an acute illness or injury that is potentially life-threatening.  Patient presents with evidence of symptomatic hyperglycemia, improving in the ED and otherwise with a benign workup, suitable for outpatient management.  Looks well with a reassuring exam.  Blood work without signs of DKA.  Hyperglycemia improving with fluids.  Normal CBC.  No barriers to outpatient management at this point.  Discussed close ED return  precautions.  Clinical Course as of 04/03/23 0348  Sat Apr 03, 2023  0343 Reassessed.  Feeling better.  He pulls up his CGM app, glucose 230.  Discussed reassuring blood work and possible etiologies of his symptoms. [DS]    Clinical Course User Index [DS] Delton Prairie, MD     FINAL CLINICAL IMPRESSION(S) / ED DIAGNOSES   Final diagnoses:  Hyperglycemia     Rx / DC Orders   ED Discharge Orders     None        Note:  This document was prepared using Dragon voice recognition software and may include unintentional dictation errors.   Delton Prairie, MD 04/03/23 [phone]
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
## 2023-09-07 ENCOUNTER — Other Ambulatory Visit: Payer: Self-pay

## 2023-09-07 ENCOUNTER — Emergency Department: Admission: EM | Admit: 2023-09-07 | Discharge: 2023-09-07 | Disposition: A | Discharge status: Home / Self Care

## 2023-09-07 ENCOUNTER — Emergency Department
Admission: EM | Admit: 2023-09-07 | Discharge: 2023-09-08 | Disposition: A | Discharge status: Home / Self Care | Source: Home / Self Care | Attending: Emergency Medicine | Admitting: Emergency Medicine

## 2023-09-07 DIAGNOSIS — R09A2 Foreign body sensation, throat: Secondary | ICD-10-CM | POA: Insufficient documentation

## 2023-09-07 DIAGNOSIS — E109 Type 1 diabetes mellitus without complications: Secondary | ICD-10-CM | POA: Insufficient documentation

## 2023-09-07 DIAGNOSIS — R112 Nausea with vomiting, unspecified: Secondary | ICD-10-CM

## 2023-09-07 DIAGNOSIS — Z21 Asymptomatic human immunodeficiency virus [HIV] infection status: Secondary | ICD-10-CM | POA: Diagnosis not present

## 2023-09-07 LAB — COMPREHENSIVE METABOLIC PANEL WITH GFR
ALT: 29 U/L (ref 0–44)
AST: 20 U/L (ref 15–41)
Albumin: 4.1 g/dL (ref 3.5–5.0)
Alkaline Phosphatase: 76 U/L (ref 38–126)
Anion gap: 10 (ref 5–15)
BUN: 12 mg/dL (ref 6–20)
CO2: 24 mmol/L (ref 22–32)
Calcium: 8.7 mg/dL — ABNORMAL LOW (ref 8.9–10.3)
Chloride: 102 mmol/L (ref 98–111)
Creatinine, Ser: 0.7 mg/dL (ref 0.61–1.24)
GFR, Estimated: >60 mL/min (ref >60)
Glucose, Bld: 224 mg/dL — ABNORMAL HIGH (ref 70–99)
Potassium: 4 mmol/L (ref 3.5–5.1)
Sodium: 136 mmol/L (ref 135–145)
Total Bilirubin: 0.7 mg/dL (ref 0.0–1.2)
Total Protein: 7.6 g/dL (ref 6.5–8.1)

## 2023-09-07 LAB — URINALYSIS, ROUTINE W REFLEX MICROSCOPIC
Bacteria, UA: NONE SEEN
Bilirubin Urine: NEGATIVE
Glucose, UA: >=500 mg/dL — AB
Hgb urine dipstick: NEGATIVE
Ketones, ur: NEGATIVE mg/dL
Leukocytes,Ua: NEGATIVE
Nitrite: NEGATIVE
Protein, ur: 30 mg/dL — AB
Specific Gravity, Urine: 1.03 (ref 1.005–1.030)
Squamous Epithelial / HPF: 0 /HPF (ref 0–5)
pH: 6 (ref 5.0–8.0)

## 2023-09-07 LAB — CBC
HCT: 47.1 % (ref 39.0–52.0)
Hemoglobin: 16.3 g/dL (ref 13.0–17.0)
MCH: 28.7 pg (ref 26.0–34.0)
MCHC: 34.6 g/dL (ref 30.0–36.0)
MCV: 82.9 fL (ref 80.0–100.0)
Platelets: 236 10*3/uL (ref 150–400)
RBC: 5.68 MIL/uL (ref 4.22–5.81)
RDW: 12.3 % (ref 11.5–15.5)
WBC: 9.5 10*3/uL (ref 4.0–10.5)
nRBC: 0 % (ref 0.0–0.2)

## 2023-09-07 LAB — RESP PANEL BY RT-PCR (RSV, FLU A&B, COVID)  RVPGX2
Influenza A by PCR: NEGATIVE
Influenza B by PCR: NEGATIVE
Resp Syncytial Virus by PCR: NEGATIVE
SARS Coronavirus 2 by RT PCR: NEGATIVE

## 2023-09-07 LAB — LIPASE, BLOOD: Lipase: 20 U/L (ref 11–51)

## 2023-09-07 MED ORDER — METOCLOPRAMIDE HCL 5 MG/ML IJ SOLN
10.0000 mg | Freq: Once | INTRAMUSCULAR | Status: AC
  Administered 2023-09-07: 10 mg via INTRAVENOUS
  Filled 2023-09-07: qty 2

## 2023-09-07 MED ORDER — ONDANSETRON 4 MG PO TBDP: 4.0000 mg | ORAL_TABLET | Freq: Three times a day (TID) | ORAL | 0 refills | Status: DC | PRN

## 2023-09-07 MED ORDER — DIPHENHYDRAMINE HCL 50 MG/ML IJ SOLN
12.5000 mg | Freq: Once | INTRAMUSCULAR | Status: AC
  Administered 2023-09-07: 25 mg via INTRAVENOUS
  Filled 2023-09-07: qty 1

## 2023-09-07 NOTE — ED Triage Notes (Signed)
 Pt arrives via [facility] from home for n/v that started 0430 this morning. Pt recevied 4mg  zofran  IVP and NS bolus en route with EMS. Pt has hx of IDDM. Pt reports more than 10 episodes of emesis this morning.    CBG 196

## 2023-09-07 NOTE — ED Triage Notes (Signed)
 Pt to ED via EMS from home, pt reports choking on a chip earlier tonight and now is feeling sob. Pt has some discomfort to throat

## 2023-09-07 NOTE — ED Provider Notes (Addendum)
 [facility] Provider Note    Event Date/Time   First MD Initiated Contact with Patient 09/07/23 2310     (approximate)   History   Choking   HPI  Thomas Maddox. is a 23 y.o. male   Past medical history of type I diabetic with a history of DKA, who presents the emergency department with a chief complaint of choking on a potato chip earlier.  He was eating potato chip and thought it got hung up in the bottom of his neck, was gagging for a little bit had difficulty breathing but that has resolved now.  He has been able to drink water without issue since then.  He feels like there is something still caught up in the bottom of his neck spreading to his sternal notch area.   External Medical Documents Reviewed: An earlier hospital appointment for nausea vomiting diarrhea was evaluated and discharged.      Physical Exam   Triage Vital Signs: ED Triage Vitals  Encounter Vitals Group     BP 09/07/23 2302 138/87     Systolic BP Percentile --      Diastolic BP Percentile --      Pulse Rate 09/07/23 2302 (!) 115     Resp 09/07/23 2302 18     Temp 09/07/23 2302 98.5 F (36.9 C)     Temp src --      SpO2 09/07/23 2302 99 %     Weight 09/07/23 2301 181 lb (82.1 kg)     Height 09/07/23 2301 5\' 6"  (1.676 m)     Head Circumference --      Peak Flow --      Pain Score 09/07/23 2300 9     Pain Loc --      Pain Education --      Exclude from Growth Chart --     Most recent vital signs: Vitals:   09/07/23 2302  BP: 138/87  Pulse: (!) 115  Resp: 18  Temp: 98.5 F (36.9 C)  SpO2: 99%    General: Awake, no distress.  CV:  Good peripheral perfusion.  Resp:  Normal effort.  Abd:  No distention.  Other:  Maintaining airway speaking with normal phonation, maintaining secretions, breathing comfortably, clear lungs, and mentation is normal.   ED Results / Procedures / Treatments   Labs (all labs ordered are listed, but only abnormal results are  displayed) Labs Reviewed - No data to display   RADIOLOGY I independently reviewed and interpreted CT of the neck and see no obvious foreign body I also reviewed radiologist's formal read.   PROCEDURES:  Critical Care performed: No  Procedures   MEDICATIONS ORDERED IN ED: Medications - No data to display  IMPRESSION / MDM / ASSESSMENT AND PLAN / ED COURSE  I reviewed the triage vital signs and the nursing notes.                                Patient's presentation is most consistent with acute presentation with potential threat to life or bodily function.  Differential diagnosis includes, but is not limited to, esophageal impaction, airway obstruction, irritation to the posterior oropharynx/trachea/esophagus   The patient is on the cardiac monitor to evaluate for evidence of arrhythmia and/or significant heart rate changes.  MDM:    He had some irritation after he was gagging on a potato chip and has some  discomfort and globus sensation to the sternal notch area.  Maintaining airway and secretions.  Able to tolerate p.o.  Getting a CT of the neck and bring it down to the area of the upper chest where he is feeling discomfort to rule out foreign body.  If negative plan will be for discharge.  Considered aspiration pneumonia but with no shortness of breath, cough, no focality on auscultation I doubt at this time.       FINAL CLINICAL IMPRESSION(S) / ED DIAGNOSES   Final diagnoses:  Globus sensation     Rx / DC Orders   ED Discharge Orders     None        Note:  This document was prepared using Dragon voice recognition software and may include unintentional dictation errors.    Buell Carmin, MD 09/08/23 2440    Buell Carmin, MD 09/08/23 [phone]

## 2023-09-07 NOTE — ED Notes (Signed)
 Pt asking about time until he is discharged, pt informed that not all of his labs have resulted

## 2023-09-07 NOTE — Discharge Instructions (Addendum)
 Your evaluation in the emergency department was overall reassuring.  I suspect you likely have a viral illness that should improve on its own within the next couple days.  I have prescribed you nausea medication to use as needed.  Please do follow-up with your primary care doctor for reevaluation, and return to the emergency department with any new or worsening symptoms.

## 2023-09-07 NOTE — ED Notes (Signed)
 Verbal ordered obtained from dr Cam Cava while at bedside with the pt to administer 25mg  benadryl  iv instead of 12.5mg , this RN verbalized understanding  Pt is c/o feeling jittery, can't sit still, doesn't like the way he is feeling, states that he feels anxious, heart rate increased to 125. Pt feels clammy to touch

## 2023-09-07 NOTE — ED Provider Notes (Signed)
 [facility] Provider Note    Event Date/Time   First MD Initiated Contact with Patient 09/07/23 [phone]     (approximate)   History   Emesis  Pt arrives via [facility] from home for n/v that started 0430 this morning. Pt recevied 4mg  zofran  IVP and NS bolus en route with EMS. Pt has hx of IDDM. Pt reports more than 10 episodes of emesis this morning.    CBG 196   HPI Thomas Maddox. is a 23 y.o. male PMH T1DM, diabetic gastroparesis, HIV on Biktarvy , GERD presents for evaluation of nausea and vomiting - Started this morning, did develop some left upper quadrant/flank pain after vomiting.  Vomited about 10 times, nonbloody/nonbilious.  Last bowel movement yesterday, unremarkable. -No abdominal surgical history -No sick contacts, no culprit foods  POC glucose 196 by EMS.      Physical Exam   Triage Vital Signs: BP (!) 132/99   Pulse (!) 125   Temp 98.5 F (36.9 C) (Oral)   Resp 17   Ht 5\' 6"  (1.676 m)   Wt 84.8 kg   SpO2 99%   BMI 30.18 kg/m    Most recent vital signs: Vitals:   09/07/23 0946 09/07/23 1141  BP: 132/84 (!) 132/99  Pulse: (!) 105 (!) 125  Resp: 17   Temp: 98.5 F (36.9 C)   SpO2: 100% 99%     General: Awake, no distress.  Well-appearing. CV:  Good peripheral perfusion.  Mild tachycardia, regular rhythm, RP 2+ Resp:  Normal effort. CTAB Abd:  No distention.  Very mild tenderness palpation left upper quadrant with very mild left CVA tenderness.  Otherwise very benign exam with no tenderness to be palpation elsewhere.    ED Results / Procedures / Treatments   Labs (all labs ordered are listed, but only abnormal results are displayed) Labs Reviewed  COMPREHENSIVE METABOLIC PANEL WITH GFR - Abnormal; Notable for the following components:      Result Value   Glucose, Bld 224 (*)    Calcium 8.7 (*)    All other components within normal limits  URINALYSIS, ROUTINE W REFLEX MICROSCOPIC - Abnormal; Notable for the  following components:   Color, Urine YELLOW (*)    APPearance CLEAR (*)    Glucose, UA >=500 (*)    Protein, ur 30 (*)    All other components within normal limits  RESP PANEL BY RT-PCR (RSV, FLU A&B, COVID)  RVPGX2  LIPASE, BLOOD  CBC     EKG  N/a   RADIOLOGY N/a    PROCEDURES:  Critical Care performed: No  Procedures   MEDICATIONS ORDERED IN ED: Medications  metoCLOPramide  (REGLAN ) injection 10 mg (10 mg Intravenous Given 09/07/23 1030)  diphenhydrAMINE  (BENADRYL ) injection 12.5 mg (25 mg Intravenous Given 09/07/23 1142)     IMPRESSION / MDM / ASSESSMENT AND PLAN / ED COURSE  I reviewed the triage vital signs and the nursing notes.                              DDX/MDM/AP: Differential diagnosis includes, but is not limited to, unlikely DKA given glucose less than 200 and patient very well-appearing here.  Suspect likely diabetic gastroparesis, consider viral syndrome including influenza or COVID-19.  Considered but doubt pancreatitis, urolithiasis, pyelonephritis.  Do not suspect other acute intra-abdominal pathology including bowel obstruction, cholecystitis, appendicitis, diverticulitis, perforated viscus.  Plan: - Already received 4 mg IV Zofran   and 1 L NS bolus from EMS --will continue IV fluid - Labs - No indication for emergent abdominal imaging at this time  Patient's presentation is most consistent with acute presentation with potential threat to life or bodily function.    ED course below.   Clinical Course as of 09/07/23 1153  Tue Sep 07, 2023  1024 Cbc wnl [MM]  1047 Lipase wnl CMP with hyperglycemia to 224, no anion gap, bicarb normal open-not consistent with DKA Hepatobiliary labs normal [MM]  1136 Patient reportedly feeling very anxious after Reglan , will give small dose IV Benadryl  [MM]  1150 Urinalysis reviewed, unremarkable  P.o. challenge successful  Patient feeling much better after Benadryl , suspect uncomplicated reaction to  metoclopramide , heart rate improved to about 100 on my reeval.  Family member will drive patient home.  Rx Zofran , fortunately unremarkable workup here.  Viral swab negative.  No evidence of underlying acute pathology. [MM]    Clinical Course User Index [MM] Collis Deaner, MD     FINAL CLINICAL IMPRESSION(S) / ED DIAGNOSES   Final diagnoses:  Nausea and vomiting, unspecified vomiting type     Rx / DC Orders   ED Discharge Orders          Ordered    ondansetron  (ZOFRAN -ODT) 4 MG disintegrating tablet  Every 8 hours PRN        09/07/23 1151             Note:  This document was prepared using Dragon voice recognition software and may include unintentional dictation errors.   Collis Deaner, MD 09/07/23 1153

## 2023-09-08 ENCOUNTER — Emergency Department

## 2023-09-08 NOTE — Discharge Instructions (Signed)
 Fortunately your evaluation in the Emergency Department did not show anything stuck in your throat.  Thank you for choosing us  for your health care today!  Please see your primary doctor this week for a follow up appointment.   If you have any new, worsening, or unexpected symptoms call your doctor right away or come back to the emergency department for reevaluation.  It was my pleasure to care for you today.   Arron Large Margery Sheets, MD

## 2023-11-14 ENCOUNTER — Other Ambulatory Visit: Payer: Self-pay

## 2023-11-14 ENCOUNTER — Inpatient Hospital Stay
Admission: EM | Admit: 2023-11-14 | Discharge: 2023-11-15 | DRG: 919 | Disposition: A | Discharge status: Home / Self Care | Attending: Internal Medicine | Admitting: Internal Medicine

## 2023-11-14 DIAGNOSIS — T85614A Breakdown (mechanical) of insulin pump, initial encounter: Principal | ICD-10-CM | POA: Diagnosis present

## 2023-11-14 DIAGNOSIS — Y742 Prosthetic and other implants, materials and accessory general hospital and personal-use devices associated with adverse incidents: Secondary | ICD-10-CM | POA: Diagnosis present

## 2023-11-14 DIAGNOSIS — Z882 Allergy status to sulfonamides status: Secondary | ICD-10-CM

## 2023-11-14 DIAGNOSIS — Z8249 Family history of ischemic heart disease and other diseases of the circulatory system: Secondary | ICD-10-CM | POA: Diagnosis not present

## 2023-11-14 DIAGNOSIS — Z21 Asymptomatic human immunodeficiency virus [HIV] infection status: Secondary | ICD-10-CM | POA: Diagnosis present

## 2023-11-14 DIAGNOSIS — D893 Immune reconstitution syndrome: Secondary | ICD-10-CM | POA: Diagnosis present

## 2023-11-14 DIAGNOSIS — Z79899 Other long term (current) drug therapy: Secondary | ICD-10-CM | POA: Diagnosis not present

## 2023-11-14 DIAGNOSIS — T50995A Adverse effect of other drugs, medicaments and biological substances, initial encounter: Secondary | ICD-10-CM | POA: Diagnosis present

## 2023-11-14 DIAGNOSIS — F909 Attention-deficit hyperactivity disorder, unspecified type: Secondary | ICD-10-CM | POA: Diagnosis present

## 2023-11-14 DIAGNOSIS — K219 Gastro-esophageal reflux disease without esophagitis: Secondary | ICD-10-CM | POA: Diagnosis present

## 2023-11-14 DIAGNOSIS — E1010 Type 1 diabetes mellitus with ketoacidosis without coma: Principal | ICD-10-CM | POA: Diagnosis present

## 2023-11-14 DIAGNOSIS — E109 Type 1 diabetes mellitus without complications: Secondary | ICD-10-CM

## 2023-11-14 LAB — CBC
HCT: 49.8 % (ref 39.0–52.0)
HCT: 39 % (ref 39.0–52.0)
Hemoglobin: 17 g/dL (ref 13.0–17.0)
Hemoglobin: 13.7 g/dL (ref 13.0–17.0)
MCH: 28.8 pg (ref 26.0–34.0)
MCH: 29.3 pg (ref 26.0–34.0)
MCHC: 34.1 g/dL (ref 30.0–36.0)
MCHC: 35.1 g/dL (ref 30.0–36.0)
MCV: 84.3 fL (ref 80.0–100.0)
MCV: 83.3 fL (ref 80.0–100.0)
Platelets: 270 K/uL (ref 150–400)
Platelets: 214 K/uL (ref 150–400)
RBC: 5.91 MIL/uL — ABNORMAL HIGH (ref 4.22–5.81)
RBC: 4.68 MIL/uL (ref 4.22–5.81)
RDW: 12.4 % (ref 11.5–15.5)
RDW: 12.5 % (ref 11.5–15.5)
WBC: 9.1 K/uL (ref 4.0–10.5)
WBC: 6.5 K/uL (ref 4.0–10.5)
nRBC: 0 % (ref 0.0–0.2)
nRBC: 0 % (ref 0.0–0.2)

## 2023-11-14 LAB — URINALYSIS, ROUTINE W REFLEX MICROSCOPIC
Bacteria, UA: NONE SEEN
Bilirubin Urine: NEGATIVE
Glucose, UA: >=500 mg/dL — AB
Hgb urine dipstick: NEGATIVE
Ketones, ur: 80 mg/dL — AB
Leukocytes,Ua: NEGATIVE
Nitrite: NEGATIVE
Protein, ur: NEGATIVE mg/dL
Specific Gravity, Urine: 1.025 (ref 1.005–1.030)
Squamous Epithelial / HPF: 0 /HPF (ref 0–5)
pH: 5 (ref 5.0–8.0)

## 2023-11-14 LAB — BASIC METABOLIC PANEL WITH GFR
Anion gap: 20 — ABNORMAL HIGH (ref 5–15)
Anion gap: 12 (ref 5–15)
Anion gap: 9 (ref 5–15)
Anion gap: 6 (ref 5–15)
Anion gap: 9 (ref 5–15)
BUN: 26 mg/dL — ABNORMAL HIGH (ref 6–20)
BUN: 24 mg/dL — ABNORMAL HIGH (ref 6–20)
BUN: 18 mg/dL (ref 6–20)
BUN: 17 mg/dL (ref 6–20)
BUN: 13 mg/dL (ref 6–20)
CO2: 13 mmol/L — ABNORMAL LOW (ref 22–32)
CO2: 17 mmol/L — ABNORMAL LOW (ref 22–32)
CO2: 18 mmol/L — ABNORMAL LOW (ref 22–32)
CO2: 22 mmol/L (ref 22–32)
CO2: 24 mmol/L (ref 22–32)
Calcium: 10.1 mg/dL (ref 8.9–10.3)
Calcium: 8.9 mg/dL (ref 8.9–10.3)
Calcium: 8.3 mg/dL — ABNORMAL LOW (ref 8.9–10.3)
Calcium: 8.4 mg/dL — ABNORMAL LOW (ref 8.9–10.3)
Calcium: 8.5 mg/dL — ABNORMAL LOW (ref 8.9–10.3)
Chloride: 102 mmol/L (ref 98–111)
Chloride: 107 mmol/L (ref 98–111)
Chloride: 111 mmol/L (ref 98–111)
Chloride: 109 mmol/L (ref 98–111)
Chloride: 107 mmol/L (ref 98–111)
Creatinine, Ser: 1.23 mg/dL (ref 0.61–1.24)
Creatinine, Ser: 1.08 mg/dL (ref 0.61–1.24)
Creatinine, Ser: 0.91 mg/dL (ref 0.61–1.24)
Creatinine, Ser: 0.89 mg/dL (ref 0.61–1.24)
Creatinine, Ser: 0.84 mg/dL (ref 0.61–1.24)
GFR, Estimated: >60 mL/min (ref >60)
GFR, Estimated: >60 mL/min (ref >60)
GFR, Estimated: >60 mL/min (ref >60)
GFR, Estimated: >60 mL/min (ref >60)
GFR, Estimated: >60 mL/min (ref >60)
Glucose, Bld: 433 mg/dL — ABNORMAL HIGH (ref 70–99)
Glucose, Bld: 364 mg/dL — ABNORMAL HIGH (ref 70–99)
Glucose, Bld: 179 mg/dL — ABNORMAL HIGH (ref 70–99)
Glucose, Bld: 179 mg/dL — ABNORMAL HIGH (ref 70–99)
Glucose, Bld: 148 mg/dL — ABNORMAL HIGH (ref 70–99)
Potassium: 4.2 mmol/L (ref 3.5–5.1)
Potassium: 3.9 mmol/L (ref 3.5–5.1)
Potassium: 5.2 mmol/L — ABNORMAL HIGH (ref 3.5–5.1)
Potassium: 3.9 mmol/L (ref 3.5–5.1)
Potassium: 3.9 mmol/L (ref 3.5–5.1)
Sodium: 135 mmol/L (ref 135–145)
Sodium: 136 mmol/L (ref 135–145)
Sodium: 138 mmol/L (ref 135–145)
Sodium: 137 mmol/L (ref 135–145)
Sodium: 140 mmol/L (ref 135–145)

## 2023-11-14 LAB — CBG MONITORING, ED
Glucose-Capillary: 442 mg/dL — ABNORMAL HIGH (ref 70–99)
Glucose-Capillary: 336 mg/dL — ABNORMAL HIGH (ref 70–99)
Glucose-Capillary: 259 mg/dL — ABNORMAL HIGH (ref 70–99)
Glucose-Capillary: 155 mg/dL — ABNORMAL HIGH (ref 70–99)
Glucose-Capillary: 174 mg/dL — ABNORMAL HIGH (ref 70–99)
Glucose-Capillary: 173 mg/dL — ABNORMAL HIGH (ref 70–99)
Glucose-Capillary: 157 mg/dL — ABNORMAL HIGH (ref 70–99)
Glucose-Capillary: 139 mg/dL — ABNORMAL HIGH (ref 70–99)
Glucose-Capillary: 130 mg/dL — ABNORMAL HIGH (ref 70–99)
Glucose-Capillary: 153 mg/dL — ABNORMAL HIGH (ref 70–99)
Glucose-Capillary: 214 mg/dL — ABNORMAL HIGH (ref 70–99)

## 2023-11-14 LAB — BETA-HYDROXYBUTYRIC ACID
Beta-Hydroxybutyric Acid: 4.13 mmol/L — ABNORMAL HIGH (ref 0.05–0.27)
Beta-Hydroxybutyric Acid: 3.31 mmol/L — ABNORMAL HIGH (ref 0.05–0.27)
Beta-Hydroxybutyric Acid: 0.38 mmol/L — ABNORMAL HIGH (ref 0.05–0.27)
Beta-Hydroxybutyric Acid: 0.4 mmol/L — ABNORMAL HIGH (ref 0.05–0.27)
Beta-Hydroxybutyric Acid: 0.29 mmol/L — ABNORMAL HIGH (ref 0.05–0.27)

## 2023-11-14 LAB — HEMOGLOBIN A1C
Hgb A1c MFr Bld: 10.5 % — ABNORMAL HIGH (ref 4.8–5.6)
Mean Plasma Glucose: 254.65 mg/dL

## 2023-11-14 LAB — GLUCOSE, CAPILLARY
Glucose-Capillary: 234 mg/dL — ABNORMAL HIGH (ref 70–99)
Glucose-Capillary: 283 mg/dL — ABNORMAL HIGH (ref 70–99)

## 2023-11-14 MED ORDER — INSULIN REGULAR(HUMAN) IN NACL 100-0.9 UT/100ML-% IV SOLN
INTRAVENOUS | Status: DC
  Filled 2023-11-14 (×2): qty 100

## 2023-11-14 MED ORDER — LACTATED RINGERS IV SOLN: INTRAVENOUS | Status: DC

## 2023-11-14 MED ORDER — ENOXAPARIN SODIUM 40 MG/0.4ML IJ SOSY
40.0000 mg | PREFILLED_SYRINGE | INTRAMUSCULAR | Status: DC
  Filled 2023-11-14 (×2): qty 0.4

## 2023-11-14 MED ORDER — INSULIN ASPART 100 UNIT/ML IJ SOLN
0.0000 [IU] | Freq: Three times a day (TID) | INTRAMUSCULAR | Status: DC
  Administered 2023-11-14 (×2): 5 [IU] via SUBCUTANEOUS
  Filled 2023-11-14 (×2): qty 1

## 2023-11-14 MED ORDER — INSULIN GLARGINE-YFGN 100 UNIT/ML ~~LOC~~ SOLN
20.0000 [IU] | SUBCUTANEOUS | Status: DC
  Administered 2023-11-14: 20 [IU] via SUBCUTANEOUS
  Filled 2023-11-14 (×2): qty 0.2

## 2023-11-14 MED ORDER — DEXTROSE 50 % IV SOLN: 0.0000 mL | INTRAVENOUS | Status: DC | PRN

## 2023-11-14 MED ORDER — HYDROXYZINE HCL 50 MG PO TABS: 25.0000 mg | ORAL_TABLET | Freq: Three times a day (TID) | ORAL | Status: DC | PRN

## 2023-11-14 MED ORDER — POTASSIUM CHLORIDE 10 MEQ/100ML IV SOLN
10.0000 meq | INTRAVENOUS | Status: DC
  Administered 2023-11-14: 10 meq via INTRAVENOUS
  Filled 2023-11-14: qty 100

## 2023-11-14 MED ORDER — ETHAMBUTOL HCL 400 MG PO TABS
1000.0000 mg | ORAL_TABLET | Freq: Every day | ORAL | Status: DC
  Administered 2023-11-14 – 2023-11-15 (×2): 1000 mg via ORAL
  Filled 2023-11-14 (×2): qty 2.5

## 2023-11-14 MED ORDER — LACTATED RINGERS IV SOLN: INTRAVENOUS | Status: AC

## 2023-11-14 MED ORDER — VITAMIN B-12 1000 MCG PO TABS
1000.0000 ug | ORAL_TABLET | Freq: Every day | ORAL | Status: DC
  Administered 2023-11-14 – 2023-11-15 (×2): 1000 ug via ORAL
  Filled 2023-11-14: qty 1
  Filled 2023-11-14: qty 2

## 2023-11-14 MED ORDER — ACETAMINOPHEN 650 MG RE SUPP: 650.0000 mg | Freq: Four times a day (QID) | RECTAL | Status: DC | PRN

## 2023-11-14 MED ORDER — INSULIN REGULAR(HUMAN) IN NACL 100-0.9 UT/100ML-% IV SOLN
INTRAVENOUS | Status: DC
  Administered 2023-11-14: 13 [IU]/h via INTRAVENOUS
  Filled 2023-11-14: qty 100

## 2023-11-14 MED ORDER — FAMOTIDINE 20 MG PO TABS
20.0000 mg | ORAL_TABLET | Freq: Two times a day (BID) | ORAL | Status: DC
  Filled 2023-11-14 (×3): qty 1

## 2023-11-14 MED ORDER — SODIUM CHLORIDE 0.9 % IV BOLUS
2000.0000 mL | Freq: Once | INTRAVENOUS | Status: AC
  Administered 2023-11-14: 2000 mL via INTRAVENOUS

## 2023-11-14 MED ORDER — LACTATED RINGERS IV BOLUS
1500.0000 mL | Freq: Once | INTRAVENOUS | Status: AC
  Administered 2023-11-14: 1500 mL via INTRAVENOUS

## 2023-11-14 MED ORDER — MAGNESIUM HYDROXIDE 400 MG/5ML PO SUSP
30.0000 mL | Freq: Every day | ORAL | Status: DC | PRN
Start: 2023-11-14 — End: 2023-11-15

## 2023-11-14 MED ORDER — BICTEGRAVIR-EMTRICITAB-TENOFOV 50-200-25 MG PO TABS
1.0000 | ORAL_TABLET | Freq: Every day | ORAL | Status: DC
  Administered 2023-11-14 – 2023-11-15 (×2): 1 via ORAL
  Filled 2023-11-14 (×2): qty 1

## 2023-11-14 MED ORDER — ACETAMINOPHEN 325 MG PO TABS: 650.0000 mg | ORAL_TABLET | Freq: Four times a day (QID) | ORAL | Status: DC | PRN

## 2023-11-14 MED ORDER — ONDANSETRON HCL 4 MG/2ML IJ SOLN: 4.0000 mg | Freq: Four times a day (QID) | INTRAMUSCULAR | Status: DC | PRN

## 2023-11-14 MED ORDER — LORATADINE 10 MG PO TABS
10.0000 mg | ORAL_TABLET | Freq: Every day | ORAL | Status: DC
  Filled 2023-11-14 (×2): qty 1

## 2023-11-14 MED ORDER — POTASSIUM CHLORIDE 10 MEQ/100ML IV SOLN
10.0000 meq | INTRAVENOUS | Status: AC
  Administered 2023-11-14 (×2): 10 meq via INTRAVENOUS
  Filled 2023-11-14: qty 100

## 2023-11-14 MED ORDER — DEXTROSE IN LACTATED RINGERS 5 % IV SOLN: INTRAVENOUS | Status: AC

## 2023-11-14 MED ORDER — ONDANSETRON HCL 4 MG/2ML IJ SOLN
4.0000 mg | Freq: Once | INTRAMUSCULAR | Status: AC
  Administered 2023-11-14: 4 mg via INTRAVENOUS
  Filled 2023-11-14: qty 2

## 2023-11-14 MED ORDER — AZITHROMYCIN 500 MG PO TABS
500.0000 mg | ORAL_TABLET | Freq: Every day | ORAL | Status: DC
  Administered 2023-11-14 – 2023-11-15 (×2): 500 mg via ORAL
  Filled 2023-11-14 (×2): qty 1

## 2023-11-14 MED ORDER — DEXTROSE IN LACTATED RINGERS 5 % IV SOLN: INTRAVENOUS | Status: DC

## 2023-11-14 MED ORDER — INSULIN ASPART 100 UNIT/ML IJ SOLN
0.0000 [IU] | Freq: Three times a day (TID) | INTRAMUSCULAR | Status: DC
  Administered 2023-11-14: 8 [IU] via SUBCUTANEOUS
  Administered 2023-11-15: 3 [IU] via SUBCUTANEOUS
  Filled 2023-11-14 (×2): qty 1

## 2023-11-14 MED ORDER — ONDANSETRON HCL 4 MG PO TABS: 4.0000 mg | ORAL_TABLET | Freq: Four times a day (QID) | ORAL | Status: DC | PRN

## 2023-11-14 MED ORDER — TRAZODONE HCL 50 MG PO TABS: 25.0000 mg | ORAL_TABLET | Freq: Every evening | ORAL | Status: DC | PRN

## 2023-11-14 NOTE — ED Triage Notes (Signed)
 Pt to ed from home via POV with sister for HIGH BGL. Read HIGH since 11pm. Pt is actively vomiting. Pt has been giving himself insulin  all day through his pump.  Pt is caox4 in triage. Pale and clammy.

## 2023-11-14 NOTE — ED Provider Notes (Signed)
 [facility] Provider Note    Event Date/Time   First MD Initiated Contact with Patient 11/14/23 0114     (approximate)   History   Hyperglycemia   HPI  Thomas Maddox. is a 23 y.o. male   Past medical history of type I diabetic with a history of DKA, and insulin  pump who presents emerged apartment with hyperglycemia, nausea, polyuria and polydipsia in the setting of a suspected insulin  pump dysfunction.  He was in his regular state of health feeling well this morning when he noticed there was some leaking from his insulin  pump.  Later in the day he noticed that he continued to have rising blood sugars and started to feel unwell, fatigued with the above symptoms.  He called his diabetic coordinator at [facility] who advised changing some components out from his insulin  pump which he did around 9 PM.  Continued to feel unwell and came to the Emergency Department.    External Medical Documents Reviewed: Per hospital notes and endocrine outpatient notes from May      Physical Exam   Triage Vital Signs: ED Triage Vitals [11/14/23 0041]  Encounter Vitals Group     BP (!) 158/118     Girls Systolic BP Percentile      Girls Diastolic BP Percentile      Boys Systolic BP Percentile      Boys Diastolic BP Percentile      Pulse Rate (!) 120     Resp 16     Temp 98 F (36.7 C)     Temp Source Oral     SpO2 100 %     Weight      Height 5' 6 (1.676 m)     Head Circumference      Peak Flow      Pain Score 0     Pain Loc      Pain Education      Exclude from Growth Chart     Most recent vital signs: Vitals:   11/14/23 0041 11/14/23 0122  BP: (!) 158/118 133/88  Pulse: (!) 120 95  Resp: 16 (!) 21  Temp: 98 F (36.7 C)   SpO2: 100% 97%    General: Awake, no distress.  CV:  Good peripheral perfusion.  Resp:  Normal effort.  Abd:  No distention.  Other:  Awake alert mentation sharp, oriented, benign abdominal exam soft nontender, clear lungs  overall nontoxic-appearing.  Tachycardic.   ED Results / Procedures / Treatments   Labs (all labs ordered are listed, but only abnormal results are displayed) Labs Reviewed  BASIC METABOLIC PANEL WITH GFR - Abnormal; Notable for the following components:      Result Value   CO2 13 (*)    Glucose, Bld 433 (*)    BUN 26 (*)    Anion gap 20 (*)    All other components within normal limits  CBC - Abnormal; Notable for the following components:   RBC 5.91 (*)    All other components within normal limits  BETA-HYDROXYBUTYRIC ACID - Abnormal; Notable for the following components:   Beta-Hydroxybutyric Acid 4.13 (*)    All other components within normal limits  CBG MONITORING, ED - Abnormal; Notable for the following components:   Glucose-Capillary 442 (*)    All other components within normal limits  URINALYSIS, ROUTINE W REFLEX MICROSCOPIC  BASIC METABOLIC PANEL WITH GFR  BASIC METABOLIC PANEL WITH GFR  BASIC METABOLIC PANEL WITH GFR  BASIC METABOLIC PANEL WITH GFR  BASIC METABOLIC PANEL WITH GFR  BETA-HYDROXYBUTYRIC ACID  BETA-HYDROXYBUTYRIC ACID  BETA-HYDROXYBUTYRIC ACID  BETA-HYDROXYBUTYRIC ACID  BETA-HYDROXYBUTYRIC ACID  CBG MONITORING, ED     I ordered and reviewed the above labs they are notable for blood sugars 442, low bicarb high anion gap consistent with DKA  EKG  ED ECG REPORT I, Ginnie Shams, the attending physician, personally viewed and interpreted this ECG.   Date: 11/14/2023  EKG Time: 0053  Rate: 125  Rhythm: sinus tachycardia  Axis: nl  Intervals:nl  ST&T Change: no stemi    PROCEDURES:  Critical Care performed: Yes, see critical care procedure note(s)  .Critical Care  Performed by: Shams Ginnie, MD Authorized by: Shams Ginnie, MD   Critical care provider statement:    Critical care time (minutes):  40   Critical care was time spent personally by me on the following activities:  Development of treatment plan with patient or surrogate,  discussions with consultants, evaluation of patient's response to treatment, examination of patient, ordering and review of laboratory studies, ordering and review of radiographic studies, ordering and performing treatments and interventions, pulse oximetry, re-evaluation of patient's condition and review of old charts    MEDICATIONS ORDERED IN ED: Medications  insulin  regular, human (MYXREDLIN ) 100 units/ 100 mL infusion (has no administration in time range)  lactated ringers  infusion (has no administration in time range)  dextrose  5 % in lactated ringers  infusion (has no administration in time range)  dextrose  50 % solution 0-50 mL (has no administration in time range)  potassium chloride  10 mEq in 100 mL IVPB (has no administration in time range)  ondansetron  (ZOFRAN ) injection 4 mg (4 mg Intravenous Given 11/14/23 0058)  sodium chloride  0.9 % bolus 2,000 mL (2,000 mLs Intravenous New Bag/Given 11/14/23 0129)    External physician / consultants:  I spoke with hospital medicine for admission and regarding care plan for this patient.   IMPRESSION / MDM / ASSESSMENT AND PLAN / ED COURSE  I reviewed the triage vital signs and the nursing notes.                                Patient's presentation is most consistent with acute presentation with potential threat to life or bodily function.  Differential diagnosis includes, but is not limited to, hyperglycemia, DKA, pump dysfunction, consider less likely infection   The patient is on the cardiac monitor to evaluate for evidence of arrhythmia and/or significant heart rate changes.  MDM:    He is in DKA due to pump dysfunction.  I told him to remove the pump and we will start him on Endo tool insulin  management and admit for further management of DKA and troubleshooting of his hardware.        FINAL CLINICAL IMPRESSION(S) / ED DIAGNOSES   Final diagnoses:  Type 1 diabetes mellitus with ketoacidosis without coma (HCC)     Rx /  DC Orders   ED Discharge Orders     None        Note:  This document was prepared using Dragon voice recognition software and may include unintentional dictation errors.    Shams Ginnie, MD 11/14/23 [phone]

## 2023-11-14 NOTE — Progress Notes (Signed)
 Non billable note Patient with a history of insulin -dependent diabetes mellitus admitted to the hospital for DKA following malfunctioning of his insulin  pump.  Was on insulin  drip and has been transitioned to Lantus  and NovoLog . 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

## 2023-11-14 NOTE — ED Notes (Signed)
BS 174 mg/dl.

## 2023-11-14 NOTE — ED Notes (Signed)
CBG 157 

## 2023-11-14 NOTE — ED Notes (Signed)
 BS: 259 rate changed to 8.5 units/hr

## 2023-11-14 NOTE — Plan of Care (Signed)

## 2023-11-14 NOTE — ED Notes (Signed)
 Contacted hospitalist for transition orders.  Pt is sleeping.

## 2023-11-14 NOTE — Assessment & Plan Note (Signed)
 Will continue [employer]

## 2023-11-14 NOTE — H&P (Addendum)
 [facility]   PATIENT NAME: Thomas Maddox    MR#:  [id number]  DATE OF BIRTH:  [birth year]  DATE OF ADMISSION:  11/14/2023  PRIMARY CARE PHYSICIAN: Lorel Maxie LABOR, MD   Patient is coming from: Home  REQUESTING/REFERRING PHYSICIAN: Cyrena Mylar, MD  CHIEF COMPLAINT:   Chief Complaint  Patient presents with   Hyperglycemia    HISTORY OF PRESENT ILLNESS:  Thomas Maddox. is a 23 y.o. male with medical history significant for ADHD, type diabetes mellitus and DKA, who presented to the emergency room with acute onset of ADHD, type diabetes mellitus with history of DKA, who presented to the emergency room with acute onset of hyperglycemia.  The patient believes that he had insulin  pump dysfunction that started in the morning.  He thought it was leaking.  He called his [facility] oncologist and was advised to use some components of the pump.  They followed instructions however blood glucose measures remained in the 400s to 500s range.  He admitted to having nausea and vomiting with associated fatigue and tiredness.  He has polyuria and polydipsia.  No chest pain or palpitations.  No cough or wheezing or dyspnea.  No dysuria or hematuria or flank pain.  No bleeding diathesis  ED Course: Upon presentation to the emergency room, BP was 158/118 with heart rate of 120 with otherwise normal vital signs.  Labs revealed a blood glucose of 433 with CO2 of 13 and anion gap of 20.  Beta hydroxybutyrate was 4.13.  UA came back with more than 500 glucose and 80 ketones. EKG as reviewed by me : EKG show sinus tachycardia with a rate of 125 with right axis deviation and poor R wave progression. Imaging: None.  The patient was started on IV insulin  drip per Endo tool with 2 L bolus of IV normal saline and 4 mg of IV Zofran .  He will be admitted to the stepdown unit bed for further evaluation and management. PAST MEDICAL HISTORY:   Past Medical History:  Diagnosis Date   ADHD (attention deficit  hyperactivity disorder)    AKI (acute kidney injury) (HCC) 02/09/2016   Diabetes mellitus without complication (HCC)    DKA (diabetic ketoacidoses) 03/17/2015   Tachycardia 07/04/2019    PAST SURGICAL HISTORY:   Past Surgical History:  Procedure Laterality Date   ESOPHAGOGASTRODUODENOSCOPY (EGD) WITH PROPOFOL  N/A 02/25/2021   Procedure: ESOPHAGOGASTRODUODENOSCOPY (EGD) WITH PROPOFOL ;  Surgeon: Unk Corinn Skiff, MD;  Location: [facility] ENDOSCOPY;  Service: Gastroenterology;  Laterality: N/A;   FLEXIBLE BRONCHOSCOPY Bilateral 12/03/2022   Procedure: FLEXIBLE BRONCHOSCOPY;  Surgeon: Isadora Hose, MD;  Location: [facility] ORS;  Service: Pulmonary;  Laterality: Bilateral;   NO PAST SURGERIES      SOCIAL HISTORY:   Social History   Tobacco Use   Smoking status: Never   Smokeless tobacco: Never  Substance Use Topics   Alcohol use: No    FAMILY HISTORY:   Family History  Problem Relation Age of Onset   Hypertension Other     DRUG ALLERGIES:   Allergies  Allergen Reactions   Sulfamethoxazole -Trimethoprim  Dermatitis and Swelling    REVIEW OF SYSTEMS:   ROS As per history of present illness. All pertinent systems were reviewed above. Constitutional, HEENT, cardiovascular, respiratory, GI, GU, musculoskeletal, neuro, psychiatric, endocrine, integumentary and hematologic systems were reviewed and are otherwise negative/unremarkable except for positive findings mentioned above in the HPI.   MEDICATIONS AT HOME:   Prior to Admission medications   Medication Sig  Start Date End Date Taking? Authorizing Provider  albuterol  (VENTOLIN  HFA) 108 (90 Base) MCG/ACT inhaler Inhale 2 puffs into the lungs every 4 (four) hours as needed for wheezing or shortness of breath. 11/26/22   Sung, Jade J, MD  amphetamine -dextroamphetamine  (ADDERALL XR) 10 MG 24 hr capsule Take 10 mg by mouth every morning.     [provider]  amphetamine -dextroamphetamine  (ADDERALL XR) 30 MG 24 hr capsule Take 30  mg by mouth daily before breakfast.    [provider]  bictegravir-emtricitabine -tenofovir  AF (BIKTARVY ) 50-200-25 MG TABS tablet Take 1 tablet by mouth daily. 12/04/22   Fayette Bodily, MD  blood glucose meter kit and supplies KIT Dispense based on patient and insurance preference. Use up to four times daily as directed. (FOR ICD-9 250.00, 250.01). 07/04/19   Perri DELENA Meliton Mickey., MD  cetirizine (ZYRTEC) 10 MG tablet Take 10 mg by mouth daily as needed.    [provider]  chlorpheniramine-HYDROcodone (TUSSIONEX) 10-8 MG/5ML Take 5 mLs by mouth every 12 (twelve) hours as needed for cough. 11/26/22   Sung, Jade J, MD  cyanocobalamin  1000 MCG tablet Take 1 tablet (1,000 mcg total) by mouth daily. 12/04/22   Dorinda Drue DASEN, MD  famotidine  (PEPCID ) 20 MG tablet Take 1 tablet (20 mg total) by mouth 2 (two) times daily. 09/17/21   Viviann Pastor, MD  HUMALOG 100 UNIT/ML injection 100 units daily to be used in in the patients insulin  pump 07/03/22   [provider]  hydrOXYzine  (ATARAX ) 25 MG tablet Take 1 tablet (25 mg total) by mouth 3 (three) times daily as needed for anxiety. 09/14/21   Dorothyann Drivers, MD  insulin  aspart (NOVOLOG ) 100 UNIT/ML FlexPen Inject 8 Units into the skin 3 (three) times daily with meals. Patient not taking: Reported on 11/29/2022 07/04/19   Perri DELENA Meliton Mickey., MD  insulin  degludec (TRESIBA ) 100 UNIT/ML FlexTouch Pen Inject into the skin. Patient not taking: Reported on 02/25/2021 02/03/16   [provider]  Insulin  Pen Needle 32G X 4 MM MISC Use with insulin  07/04/19   Perri DELENA Meliton Mickey., MD  NOVOLOG  MIX 70/30 FLEXPEN (70-30) 100 UNIT/ML FlexPen Inject into the skin. Patient not taking: Reported on 11/29/2022 01/24/21   [provider]  ondansetron  (ZOFRAN ) 8 MG tablet Take 1 tablet (8 mg total) by mouth every 8 (eight) hours as needed for nausea or vomiting. 12/11/22 12/11/23  Fayette Bodily, MD  ondansetron   (ZOFRAN -ODT) 4 MG disintegrating tablet Take 1 tablet (4 mg total) by mouth every 8 (eight) hours as needed for nausea or vomiting. 09/07/23   Clarine Ozell DELENA, MD  promethazine  (PHENERGAN ) 25 MG tablet Take 1 tablet (25 mg total) by mouth every 6 (six) hours as needed for nausea or vomiting. 07/30/21   Lang Dover, MD      VITAL SIGNS:  Blood pressure 133/88, pulse 81, temperature 98 F (36.7 C), temperature source Oral, resp. rate 12, height 5' 6 (1.676 m), SpO2 100%.  PHYSICAL EXAMINATION:  Physical Exam  GENERAL:  23 y.o.-year-old acutely ill male patient lying in the bed with no acute distress.  EYES: Pupils equal, round, reactive to light and accommodation.  Mouth with slightly dry mucous membrane and tongue.  No scleral icterus. Extraocular muscles intact.  HEENT: Head atraumatic, normocephalic. Oropharynx and nasopharynx clear.  NECK:  Supple, no jugular venous distention. No thyroid  enlargement, no tenderness.  LUNGS: Normal breath sounds bilaterally, no wheezing, rales,rhonchi or crepitation. No use of accessory muscles of respiration.  CARDIOVASCULAR: Regular rate and rhythm, S1, S2 normal. No murmurs, rubs, or gallops.  ABDOMEN: Soft, nondistended, nontender. Bowel sounds present. No organomegaly or mass.  EXTREMITIES: No pedal edema, cyanosis, or clubbing.  NEUROLOGIC: Cranial nerves II through XII are intact. Muscle strength 5/5 in all extremities. Sensation intact. Gait not checked.  PSYCHIATRIC: The patient is alert and oriented x 3.  Normal affect and good eye contact. SKIN: No obvious rash, lesion, or ulcer.   LABORATORY PANEL:   CBC Recent Labs  Lab 11/14/23 0044  WBC 9.1  HGB 17.0  HCT 49.8  PLT 270   ------------------------------------------------------------------------------------------------------------------  Chemistries  Recent Labs  Lab 11/14/23 0150  NA 136  K 3.9  CL 107  CO2 17*  GLUCOSE 364*  BUN 24*  CREATININE 1.08  CALCIUM 8.9    ------------------------------------------------------------------------------------------------------------------  Cardiac Enzymes No results for input(s): TROPONINI in the last 168 hours. ------------------------------------------------------------------------------------------------------------------  RADIOLOGY:  No results found.    IMPRESSION AND PLAN:  Assessment and Plan: * DKA, type 1 (HCC) - The patient will be admitted to a stepdown bed. - This could be related to recurrent nausea and vomiting. - We will continue the on IV insulin  drip per EndoTool DKA protocol. - The patient will be aggressively hydrated with IV normal saline. - Will follow serial BMPs.   HIV (human immunodeficiency virus infection) (HCC) - Will continue Biktarvy .  GERD without esophagitis - Will continue H2 blocker therapy and PPI therapy.   DVT prophylaxis: Lovenox .  Advanced Care Planning:  Code Status: full code.  Family Communication:  The plan of care was discussed in details with the patient (and family). I answered all questions. The patient agreed to proceed with the above mentioned plan. Further management will depend upon hospital course. Disposition Plan: Back to previous home environment Consults called: none.  All the records are reviewed and case discussed with ED provider.  Status is: Inpatient  At the time of the admission, it appears that the appropriate admission status for this patient is inpatient.  This is judged to be reasonable and necessary in order to provide the required intensity of service to ensure the patient's safety given the presenting symptoms, physical exam findings and initial radiographic and laboratory data in the context of comorbid conditions.  The patient requires inpatient status due to high intensity of service, high risk of further deterioration and high frequency of surveillance required.  I certify that at the time of admission, it is my clinical  judgment that the patient will require inpatient hospital care extending more than 2 midnights.                            Dispo: The patient is from: Home              Anticipated d/c is to: Home              Patient currently is not medically stable to d/c.              Difficult to place patient: No  Authorized and performed by: Madison Peaches, MD Total critical care time:  45      minutes. Due to a high probability of clinically significant, life-threatening deterioration, the patient required my highest level of preparedness to intervene emergently and I personally spent this critical care time directly and personally managing the patient.  This critical care time included obtaining a history, examining the patient, pulse oximetry,  ordering and review of studies, arranging urgent treatment with development of management plan, evaluation of patient's response to treatment, frequent reassessment, and discussions with other providers. This critical care time was performed to assess and manage the high probability of imminent, life-threatening deterioration that could result in multiorgan failure.  It was exclusive of separately billable procedures and treating other patients and teaching time.   Madison DELENA Peaches M.D on 11/14/2023 at 5:03 AM  [facility]   From 7 PM-7 AM, contact night-coverage [email or web address]  CC: Primary care physician; Lorel Maxie DELENA, MD

## 2023-11-14 NOTE — Assessment & Plan Note (Signed)
-   Will continue H2 blocker therapy and PPI therapy.

## 2023-11-14 NOTE — Assessment & Plan Note (Addendum)
-   The patient will be admitted to a stepdown bed. - This could be related to recurrent nausea and vomiting. - We will continue the on IV insulin  drip per EndoTool DKA protocol. - The patient will be aggressively hydrated with IV normal saline. - Will follow serial BMPs.

## 2023-11-15 ENCOUNTER — Other Ambulatory Visit: Payer: Self-pay

## 2023-11-15 LAB — GLUCOSE, CAPILLARY: Glucose-Capillary: 194 mg/dL — ABNORMAL HIGH (ref 70–99)

## 2023-11-15 MED ORDER — INSULIN ASPART 100 UNIT/ML FLEXPEN
8.0000 [IU] | PEN_INJECTOR | Freq: Three times a day (TID) | SUBCUTANEOUS | 0 refills | Status: AC
  Filled 2023-11-15: qty 15, 62d supply, fill #0

## 2023-11-15 MED ORDER — INSULIN GLARGINE-YFGN 100 UNIT/ML ~~LOC~~ SOPN
24.0000 [IU] | PEN_INJECTOR | SUBCUTANEOUS | 11 refills | Status: AC
  Filled 2023-11-15: qty 15, 62d supply, fill #0

## 2023-11-15 MED ORDER — GLUCERNA SHAKE PO LIQD
237.0000 mL | Freq: Three times a day (TID) | ORAL | Status: DC
  Administered 2023-11-15: 237 mL via ORAL

## 2023-11-15 MED ORDER — INSULIN GLARGINE-YFGN 100 UNIT/ML ~~LOC~~ SOLN
24.0000 [IU] | SUBCUTANEOUS | Status: DC
  Administered 2023-11-15: 24 [IU] via SUBCUTANEOUS
  Filled 2023-11-15: qty 0.24

## 2023-11-15 MED ORDER — INSULIN PEN NEEDLE 32G X 4 MM MISC
Freq: Every day | 0 refills | Status: AC
  Filled 2023-11-15: qty 100, 30d supply, fill #0

## 2023-11-15 NOTE — Discharge Summary (Signed)
 Physician Discharge Summary   Patient: Thomas Maddox. MRN: [id number] DOB: February 14, 2001  Admit date:     11/14/2023  Discharge date: 11/15/23  Discharge Physician: Henessy Rohrer   PCP: Lorel Maxie LABOR, MD   Recommendations at discharge:   Take medications as recommended Use Lantus  and Humalog until seen by your endocrinologist Check blood sugars daily Maintain consistent carbohydrate diet  Discharge Diagnoses: Principal Problem:   DKA, type 1 (HCC) Active Problems:   ADHD   HIV (human immunodeficiency virus infection) (HCC)   GERD without esophagitis  Resolved Problems:   * No resolved hospital problems. *  Hospital Course: Thomas Maddox. is a 23 y.o. male with medical history significant for ADHD, type diabetes mellitus and DKA, who presented to the emergency room with acute onset of hyperglycemia.  The patient believes that he had insulin  pump dysfunction that started in the morning.  He thought it was leaking.  He called his [facility] oncologist and was advised to use some components of the pump.  They followed instructions however blood glucose measures remained in the 400s to 500s range.  He admitted to having nausea and vomiting with associated fatigue and tiredness.  He has polyuria and polydipsia.  No chest pain or palpitations.  No cough or wheezing or dyspnea.  No dysuria or hematuria or flank pain.  No bleeding diathesis   ED Course: Upon presentation to the emergency room, BP was 158/118 with heart rate of 120 with otherwise normal vital signs.  Labs revealed a blood glucose of 433 with CO2 of 13 and anion gap of 20.  Beta hydroxybutyrate was 4.13.  UA came back with more than 500 glucose and 80 ketones. EKG as reviewed by me : EKG show sinus tachycardia with a rate of 125 with right axis deviation and poor R wave progression. Imaging: None.   The patient was started on IV insulin  drip per Endo tool with 2 L bolus of IV normal saline and 4 mg of IV Zofran .  He will  be admitted to the stepdown unit bed for further evaluation and management.     Assessment and Plan:  * DKA, type 1 (HCC) Secondary to malfunction of insulin  pump Was treated with insulin  drip with resolution of metabolic acidosis and improvement in his hyperglycemia Patient has been placed on Lantus  and NovoLog  and will need to follow-up with his endocrinologist as an outpatient for resumption of his insulin  pump. Patient has been advised to maintain consistent carbohydrate diet     HIV (human immunodeficiency virus infection) (HCC) with complications of opportunistic infections Continue Biktarvy . Continue ethambutol  and Zithromax  daily   GERD without esophagitis Continue PPI  ADHD Continue Adderall       Consultants: None Procedures performed: None  Disposition: Home Diet recommendation:  Discharge Diet Orders (From admission, onward)     Start     Ordered   11/15/23 0000  Diet - low sodium heart healthy        11/15/23 1044   11/15/23 0000  Diet Carb Modified        11/15/23 1044           Carb modified diet DISCHARGE MEDICATION: Allergies as of 11/15/2023       Reactions   Sulfamethoxazole -trimethoprim  Dermatitis, Swelling        Medication List     STOP taking these medications    chlorpheniramine-HYDROcodone 10-8 MG/5ML Commonly known as: TUSSIONEX   doxycycline 100 MG capsule Commonly known as:  MONODOX   famotidine  20 MG tablet Commonly known as: PEPCID    HumaLOG 100 UNIT/ML injection Generic drug: insulin  lispro   ibuprofen  600 MG tablet Commonly known as: ADVIL    insulin  degludec 100 UNIT/ML FlexTouch Pen Commonly known as: TRESIBA    Lantus  SoloStar 100 UNIT/ML Solostar Pen Generic drug: insulin  glargine   NovoLOG  Mix 70/30 FlexPen (70-30) 100 UNIT/ML FlexPen Generic drug: insulin  aspart protamine - aspart   ondansetron  4 MG disintegrating tablet Commonly known as: ZOFRAN -ODT   promethazine  25 MG tablet Commonly known as:  PHENERGAN        TAKE these medications    acetaminophen  500 MG tablet Commonly known as: TYLENOL  Take 1,000 mg by mouth every 6 (six) hours as needed.   albuterol  108 (90 Base) MCG/ACT inhaler Commonly known as: VENTOLIN  HFA Inhale 2 puffs into the lungs every 4 (four) hours as needed for wheezing or shortness of breath.   amphetamine -dextroamphetamine  30 MG 24 hr capsule Commonly known as: ADDERALL XR Take 30 mg by mouth daily before breakfast. What changed: Another medication with the same name was removed. Continue taking this medication, and follow the directions you see here.   benzoyl peroxide-erythromycin gel Commonly known as: BENZAMYCIN Apply 1 application  topically at bedtime.   Biktarvy  50-200-25 MG Tabs tablet Generic drug: bictegravir-emtricitabine -tenofovir  AF Take 1 tablet by mouth daily.   blood glucose meter kit and supplies Kit Dispense based on patient and insurance preference. Use up to four times daily as directed. (FOR ICD-9 250.00, 250.01).   cetirizine 10 MG tablet Commonly known as: ZYRTEC Take 10 mg by mouth daily as needed.   cyanocobalamin  1000 MCG tablet Take 1 tablet (1,000 mcg total) by mouth daily.   ethambutol  100 MG tablet Commonly known as: MYAMBUTOL  Take 1,000 mg by mouth daily.   Glucagon 3 MG/DOSE Powd Place 1 spray into the nose as needed.   hydrOXYzine  25 MG tablet Commonly known as: ATARAX  Take 1 tablet (25 mg total) by mouth 3 (three) times daily as needed for anxiety.   insulin  aspart 100 UNIT/ML FlexPen Commonly known as: NOVOLOG  Inject 8 Units into the skin 3 (three) times daily with meals.   insulin  glargine-yfgn 100 UNIT/ML Pen Commonly known as: SEMGLEE  Inject 24 Units into the skin daily. Start taking on: November 16, 2023   Insulin  Pen Needle 32G X 4 MM Misc Use with insulin    omeprazole  20 MG capsule Commonly known as: PRILOSEC Take 20 mg by mouth 2 (two) times daily.   ondansetron  8 MG tablet Commonly  known as: Zofran  Take 1 tablet (8 mg total) by mouth every 8 (eight) hours as needed for nausea or vomiting.        Discharge Exam: Filed Weights   11/14/23 1022  Weight: 90.3 kg    GENERAL: Appears comfortable and in no obvious distress.  EYES: Pupils equal, round, reactive to light and accommodation.  Moist mucous membranes no scleral icterus. Extraocular muscles intact.  HEENT: Head atraumatic, normocephalic. Oropharynx and nasopharynx clear.  NECK:  Supple, no jugular venous distention. No thyroid  enlargement, no tenderness.  LUNGS: Normal breath sounds bilaterally, no wheezing, rales,rhonchi or crepitation. No use of accessory muscles of respiration.  CARDIOVASCULAR: Regular rate and rhythm, S1, S2 normal. No murmurs, rubs, or gallops.  ABDOMEN: Soft, nondistended, nontender. Bowel sounds present. No organomegaly or mass.  EXTREMITIES: No pedal edema, cyanosis, or clubbing.  NEUROLOGIC: Cranial nerves II through XII are intact. Muscle strength 5/5 in all extremities. Sensation intact. Gait not checked.  PSYCHIATRIC: The patient is alert and oriented x 3.  Normal affect and good eye contact. SKIN: No obvious rash, lesion, or ulcer.      Condition at discharge: stable  The results of significant diagnostics from this hospitalization (including imaging, microbiology, ancillary and laboratory) are listed below for reference.   Imaging Studies: No results found.  Microbiology: Results for orders placed or performed during the hospital encounter of 09/07/23  Resp panel by RT-PCR (RSV, Flu A&B, Covid) Anterior Nasal Swab     Status: None   Collection Time: 09/07/23  9:58 AM   Specimen: Anterior Nasal Swab  Result Value Ref Range Status   SARS Coronavirus 2 by RT PCR NEGATIVE NEGATIVE Final    Comment: (NOTE) SARS-CoV-2 target nucleic acids are NOT DETECTED.  The SARS-CoV-2 RNA is generally detectable in upper respiratory specimens during the acute phase of infection. The  lowest concentration of SARS-CoV-2 viral copies this assay can detect is 138 copies/mL. A negative result does not preclude SARS-Cov-2 infection and should not be used as the sole basis for treatment or other patient management decisions. A negative result may occur with  improper specimen collection/handling, submission of specimen other than nasopharyngeal swab, presence of viral mutation(s) within the areas targeted by this assay, and inadequate number of viral copies(<138 copies/mL). A negative result must be combined with clinical observations, patient history, and epidemiological information. The expected result is Negative.  Fact Sheet for Patients:  BloggerCourse.com  Fact Sheet for Healthcare Providers:  SeriousBroker.it  This test is no t yet approved or cleared by the [place]  FDA and  has been authorized for detection and/or diagnosis of SARS-CoV-2 by FDA under an Emergency Use Authorization (EUA). This EUA will remain  in effect (meaning this test can be used) for the duration of the COVID-19 declaration under Section 564(b)(1) of the Act, 21 U.S.C.section 360bbb-3(b)(1), unless the authorization is terminated  or revoked sooner.       Influenza A by PCR NEGATIVE NEGATIVE Final   Influenza B by PCR NEGATIVE NEGATIVE Final    Comment: (NOTE) The Xpert Xpress SARS-CoV-2/FLU/RSV plus assay is intended as an aid in the diagnosis of influenza from Nasopharyngeal swab specimens and should not be used as a sole basis for treatment. Nasal washings and aspirates are unacceptable for Xpert Xpress SARS-CoV-2/FLU/RSV testing.  Fact Sheet for Patients: BloggerCourse.com  Fact Sheet for Healthcare Providers: SeriousBroker.it  This test is not yet approved or cleared by the [place]  FDA and has been authorized for detection and/or diagnosis of SARS-CoV-2 by FDA under  an Emergency Use Authorization (EUA). This EUA will remain in effect (meaning this test can be used) for the duration of the COVID-19 declaration under Section 564(b)(1) of the Act, 21 U.S.C. section 360bbb-3(b)(1), unless the authorization is terminated or revoked.     Resp Syncytial Virus by PCR NEGATIVE NEGATIVE Final    Comment: (NOTE) Fact Sheet for Patients: BloggerCourse.com  Fact Sheet for Healthcare Providers: SeriousBroker.it  This test is not yet approved or cleared by the [place]  FDA and has been authorized for detection and/or diagnosis of SARS-CoV-2 by FDA under an Emergency Use Authorization (EUA). This EUA will remain in effect (meaning this test can be used) for the duration of the COVID-19 declaration under Section 564(b)(1) of the Act, 21 U.S.C. section 360bbb-3(b)(1), unless the authorization is terminated or revoked.  Performed at [facility], [address] [place], [place] [address]     Labs: CBC: Recent Labs  Lab 11/14/23 0044 11/14/23 0522  WBC 9.1 6.5  HGB 17.0 13.7  HCT 49.8 39.0  MCV 84.3 83.3  PLT 270 214   Basic Metabolic Panel: Recent Labs  Lab 11/14/23 0044 11/14/23 0150 11/14/23 0522 11/14/23 0645 11/14/23 0958  NA 135 136 138 137 140  K 4.2 3.9 5.2* 3.9 3.9  CL 102 107 111 109 107  CO2 13* 17* 18* 22 24  GLUCOSE 433* 364* 179* 179* 148*  BUN 26* 24* 18 17 13   CREATININE 1.23 1.08 0.91 0.89 0.84  CALCIUM 10.1 8.9 8.3* 8.4* 8.5*   Liver Function Tests: No results for input(s): AST, ALT, ALKPHOS, BILITOT, PROT, ALBUMIN in the last 168 hours. CBG: Recent Labs  Lab 11/14/23 1026 11/14/23 1254 11/14/23 1646 11/14/23 2036 11/15/23 0751  GLUCAP 153* 214* 234* 283* 194*    Discharge time spent: greater than 30 minutes.  Signed: Aimee Somerset, MD [facility] 11/15/2023

## 2023-11-15 NOTE — Progress Notes (Signed)
 Introduced role of nurse navigator to patient and significant other. Intake questions completed with permission of patient.   Patient currently has active PCP. Able to afford medications, including HIV medications. Currently lives with his significant other and is able to drive to/from appointments. Denies any SDOH needs at present time.

## 2023-11-15 NOTE — Inpatient Diabetes Management (Signed)
 Inpatient Diabetes Program Recommendations  AACE/ADA: New Consensus Statement on Inpatient Glycemic Control (2015)  Target Ranges:  Prepandial:   less than 140 mg/dL      Peak postprandial:   less than 180 mg/dL (1-2 hours)      Critically ill patients:  140 - 180 mg/dL   Lab Results  Component Value Date   GLUCAP 194 (H) 11/15/2023   HGBA1C 10.5 (H) 11/14/2023    Latest Reference Range & Units 11/14/23 12:54 11/14/23 16:46 11/14/23 20:36 11/15/23 07:51  Glucose-Capillary 70 - 99 mg/dL 785 (H) 765 (H) 716 (H) 194 (H)  (H): Data is abnormally high  Diabetes history: DM1 Outpatient Diabetes medications: Omnipod insulin  Pump with Dexcom G7 CGM Current orders for Inpatient glycemic control: Semglee  24 units daily, Novolog  0-15 units qid  Inpatient Diabetes Program Recommendations:   Spoke with patient regarding insulin  pump and diabetes management. Patient shared he noticed his glucose levels increase on his Dexcom G7 @ home but didn't think to check his insertion site until later. Reviewed with patient need to problem solve if glucose levels increasing with insulin  infusing. States his insertion site was wet and insertion catheter was bent. Patient states understanding of how to problem solve on future events. States has endocrinology appt. On November 17, 2023.  Thank you, Beth Spackman E. Toni Hoffmeister, RN, MSN, CDCES  Diabetes Coordinator Inpatient Glycemic Control Team Team Pager [phone] (8am-5pm) 11/15/2023 11:08 AM

## 2023-11-15 NOTE — Plan of Care (Signed)
   Problem: Education: Goal: Ability to describe self-care measures that may prevent or decrease complications (Diabetes Survival Skills Education) will improve Outcome: Progressing

## 2023-12-13 ENCOUNTER — Encounter: Payer: Self-pay | Admitting: *Deleted

## 2023-12-13 ENCOUNTER — Other Ambulatory Visit: Payer: Self-pay

## 2023-12-13 ENCOUNTER — Emergency Department
Admission: EM | Admit: 2023-12-13 | Discharge: 2023-12-13 | Discharge status: Against Medical Advice | Attending: Emergency Medicine | Admitting: Emergency Medicine

## 2023-12-13 DIAGNOSIS — Z5321 Procedure and treatment not carried out due to patient leaving prior to being seen by health care provider: Secondary | ICD-10-CM | POA: Diagnosis not present

## 2023-12-13 DIAGNOSIS — M545 Low back pain, unspecified: Secondary | ICD-10-CM | POA: Insufficient documentation

## 2023-12-13 DIAGNOSIS — Y92410 Unspecified street and highway as the place of occurrence of the external cause: Secondary | ICD-10-CM | POA: Diagnosis not present

## 2023-12-13 DIAGNOSIS — M542 Cervicalgia: Secondary | ICD-10-CM | POA: Insufficient documentation
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
## 2023-12-14 ENCOUNTER — Encounter: Payer: Self-pay | Admitting: Emergency Medicine

## 2023-12-14 ENCOUNTER — Emergency Department
Admission: EM | Admit: 2023-12-14 | Discharge: 2023-12-14 | Disposition: A | Discharge status: Home / Self Care | Attending: Emergency Medicine | Admitting: Emergency Medicine

## 2023-12-14 DIAGNOSIS — M545 Low back pain, unspecified: Secondary | ICD-10-CM | POA: Insufficient documentation

## 2023-12-14 DIAGNOSIS — S199XXA Unspecified injury of neck, initial encounter: Secondary | ICD-10-CM | POA: Diagnosis present

## 2023-12-14 DIAGNOSIS — S134XXA Sprain of ligaments of cervical spine, initial encounter: Secondary | ICD-10-CM | POA: Diagnosis not present

## 2023-12-14 DIAGNOSIS — Z21 Asymptomatic human immunodeficiency virus [HIV] infection status: Secondary | ICD-10-CM | POA: Diagnosis not present

## 2023-12-14 DIAGNOSIS — M25512 Pain in left shoulder: Secondary | ICD-10-CM | POA: Diagnosis not present

## 2023-12-14 DIAGNOSIS — E109 Type 1 diabetes mellitus without complications: Secondary | ICD-10-CM | POA: Insufficient documentation

## 2023-12-14 DIAGNOSIS — Y92410 Unspecified street and highway as the place of occurrence of the external cause: Secondary | ICD-10-CM | POA: Insufficient documentation

## 2023-12-14 MED ORDER — TIZANIDINE HCL 4 MG PO TABS: 4.0000 mg | ORAL_TABLET | Freq: Three times a day (TID) | ORAL | 0 refills | Status: AC

## 2023-12-14 MED ORDER — NAPROXEN 500 MG PO TABS: 500.0000 mg | ORAL_TABLET | Freq: Two times a day (BID) | ORAL | 0 refills | Status: AC
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
## 2023-12-14 NOTE — Discharge Instructions (Signed)
 Please follow-up with your primary care provider if you are not improving over the week.  Be aware that the tizanidine  may cause dizziness or drowsiness and you should not work, drive, climb, or use machinery for at least 8 hours after your last dose.  Return to the emergency department if your symptoms change or worsen and if you cannot see a primary care.
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
## 2024-01-01 ENCOUNTER — Other Ambulatory Visit: Payer: Self-pay

## 2024-01-01 ENCOUNTER — Emergency Department
Admission: EM | Admit: 2024-01-01 | Discharge: 2024-01-01 | Disposition: A | Discharge status: Home / Self Care | Attending: Emergency Medicine | Admitting: Emergency Medicine

## 2024-01-01 DIAGNOSIS — H4311 Vitreous hemorrhage, right eye: Secondary | ICD-10-CM | POA: Insufficient documentation

## 2024-01-01 DIAGNOSIS — Z21 Asymptomatic human immunodeficiency virus [HIV] infection status: Secondary | ICD-10-CM | POA: Diagnosis not present

## 2024-01-01 DIAGNOSIS — E109 Type 1 diabetes mellitus without complications: Secondary | ICD-10-CM | POA: Diagnosis not present

## 2024-01-01 DIAGNOSIS — H5789 Other specified disorders of eye and adnexa: Secondary | ICD-10-CM | POA: Diagnosis present

## 2024-01-01 NOTE — Discharge Instructions (Addendum)
 You have been diagnosed with possible vitreous hemorrhage of right eye.  Please sleep upright.  Please call on Monday to [facility] in [place] for ultrasound and follow-up.  Please come back to ED or go to your PCP if you have any symptoms or symptoms worsen.
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
## 2024-01-01 NOTE — ED Triage Notes (Signed)
 [facility] seen at [facility] due to hair in vision on the right side that started at 11 am today and has gotten worse since, pt states they sent him for an eye ultrasound. Pt denies pain, reports dryness and discomfort in right eye. Pt states worm-like spot moves w/ eye movement. Pt wears glasses.
